# Patient Record
Sex: Female | Born: 1970 | Race: White | Hispanic: No | Smoking: Current some day smoker
Health system: Southern US, Community
[De-identification: ages and names within clinical notes are randomized; demographics above are authoritative.]

## PROBLEM LIST (undated history)

## (undated) DIAGNOSIS — N8110 Cystocele, unspecified: Secondary | ICD-10-CM

## (undated) DIAGNOSIS — G932 Benign intracranial hypertension: Secondary | ICD-10-CM

## (undated) DIAGNOSIS — N719 Inflammatory disease of uterus, unspecified: Secondary | ICD-10-CM

## (undated) DIAGNOSIS — E282 Polycystic ovarian syndrome: Secondary | ICD-10-CM

## (undated) DIAGNOSIS — N809 Endometriosis, unspecified: Secondary | ICD-10-CM

## (undated) HISTORY — PX: HIP ARTHROPLASTY: SHX981

## (undated) HISTORY — PX: OTHER SURGICAL HISTORY: SHX169

## (undated) HISTORY — PX: RIGHT OOPHORECTOMY: SHX2359

## (undated) HISTORY — PX: WRIST SURGERY: SHX841

## (undated) HISTORY — PX: ABDOMINAL HYSTERECTOMY: SHX81

## (undated) HISTORY — PX: LAPAROTOMY: SHX154

## (undated) HISTORY — PX: TONSILLECTOMY: SUR1361

---
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
## 1991-05-10 DIAGNOSIS — E282 Polycystic ovarian syndrome: Secondary | ICD-10-CM

## 1991-05-10 HISTORY — DX: Polycystic ovarian syndrome: E28.2

## 1994-05-09 DIAGNOSIS — N809 Endometriosis, unspecified: Secondary | ICD-10-CM

## 1994-05-09 HISTORY — DX: Endometriosis, unspecified: N80.9

## 1994-05-09 HISTORY — PX: OTHER SURGICAL HISTORY: SHX169

## 2004-03-29 DIAGNOSIS — F4310 Post-traumatic stress disorder, unspecified: Secondary | ICD-10-CM | POA: Insufficient documentation

## 2005-05-09 HISTORY — PX: LEFT OOPHORECTOMY: SHX1961

## 2005-11-24 ENCOUNTER — Emergency Department: Payer: Self-pay | Admitting: Emergency Medicine

## 2006-06-03 ENCOUNTER — Emergency Department: Payer: Self-pay | Admitting: Emergency Medicine

## 2006-09-12 ENCOUNTER — Emergency Department: Payer: Self-pay | Admitting: Emergency Medicine

## 2007-01-04 ENCOUNTER — Emergency Department: Payer: Self-pay | Admitting: Emergency Medicine

## 2007-01-10 ENCOUNTER — Emergency Department: Payer: Self-pay | Admitting: Internal Medicine

## 2007-06-03 ENCOUNTER — Emergency Department: Payer: Self-pay | Admitting: Emergency Medicine

## 2007-06-15 ENCOUNTER — Emergency Department: Payer: Self-pay | Admitting: Emergency Medicine

## 2008-02-12 ENCOUNTER — Emergency Department (HOSPITAL_COMMUNITY): Admission: EM | Admit: 2008-02-12 | Discharge: 2008-02-12 | Payer: Self-pay | Admitting: Emergency Medicine

## 2008-03-17 ENCOUNTER — Emergency Department: Payer: Self-pay | Admitting: Emergency Medicine

## 2008-03-25 ENCOUNTER — Emergency Department: Payer: Self-pay | Admitting: Emergency Medicine

## 2008-03-26 ENCOUNTER — Ambulatory Visit: Payer: Self-pay | Admitting: Emergency Medicine

## 2008-06-28 ENCOUNTER — Emergency Department: Payer: Self-pay | Admitting: Unknown Physician Specialty

## 2008-08-30 ENCOUNTER — Emergency Department: Payer: Self-pay | Admitting: Emergency Medicine

## 2009-06-26 ENCOUNTER — Emergency Department: Payer: Self-pay | Admitting: Emergency Medicine

## 2009-08-03 ENCOUNTER — Emergency Department: Payer: Self-pay | Admitting: Emergency Medicine

## 2009-08-23 ENCOUNTER — Emergency Department: Payer: Self-pay | Admitting: Emergency Medicine

## 2009-11-15 ENCOUNTER — Emergency Department (HOSPITAL_COMMUNITY): Admission: EM | Admit: 2009-11-15 | Discharge: 2009-11-15 | Payer: Self-pay | Admitting: Emergency Medicine

## 2009-11-17 ENCOUNTER — Emergency Department (HOSPITAL_COMMUNITY): Admission: EM | Admit: 2009-11-17 | Discharge: 2009-11-17 | Payer: Self-pay | Admitting: Emergency Medicine

## 2010-01-10 ENCOUNTER — Emergency Department (HOSPITAL_COMMUNITY): Admission: EM | Admit: 2010-01-10 | Discharge: 2010-01-10 | Payer: Self-pay | Admitting: Emergency Medicine

## 2010-02-06 ENCOUNTER — Emergency Department (HOSPITAL_COMMUNITY): Admission: EM | Admit: 2010-02-06 | Discharge: 2010-02-07 | Payer: Self-pay | Admitting: Emergency Medicine

## 2010-02-09 ENCOUNTER — Emergency Department: Payer: Self-pay | Admitting: Emergency Medicine

## 2010-06-02 ENCOUNTER — Emergency Department: Payer: Self-pay | Admitting: Emergency Medicine

## 2010-07-25 LAB — URINALYSIS, ROUTINE W REFLEX MICROSCOPIC
Glucose, UA: NEGATIVE mg/dL
Hgb urine dipstick: NEGATIVE
Nitrite: NEGATIVE
Specific Gravity, Urine: 1.011 (ref 1.005–1.030)

## 2010-07-25 LAB — URINE CULTURE

## 2010-11-11 ENCOUNTER — Emergency Department: Payer: Self-pay | Admitting: Emergency Medicine

## 2010-11-17 ENCOUNTER — Emergency Department (HOSPITAL_COMMUNITY)
Admission: EM | Admit: 2010-11-17 | Discharge: 2010-11-18 | Disposition: A | Discharge status: Home / Self Care | Payer: Self-pay | Attending: Emergency Medicine | Admitting: Emergency Medicine

## 2010-11-17 DIAGNOSIS — R110 Nausea: Secondary | ICD-10-CM | POA: Insufficient documentation

## 2010-11-17 DIAGNOSIS — N390 Urinary tract infection, site not specified: Secondary | ICD-10-CM | POA: Insufficient documentation

## 2010-11-17 DIAGNOSIS — M542 Cervicalgia: Secondary | ICD-10-CM | POA: Insufficient documentation

## 2010-11-17 DIAGNOSIS — M62838 Other muscle spasm: Secondary | ICD-10-CM | POA: Insufficient documentation

## 2010-11-17 DIAGNOSIS — R509 Fever, unspecified: Secondary | ICD-10-CM | POA: Insufficient documentation

## 2010-11-17 DIAGNOSIS — M479 Spondylosis, unspecified: Secondary | ICD-10-CM | POA: Insufficient documentation

## 2010-11-17 DIAGNOSIS — G932 Benign intracranial hypertension: Secondary | ICD-10-CM | POA: Insufficient documentation

## 2010-11-18 ENCOUNTER — Emergency Department (HOSPITAL_COMMUNITY): Payer: Self-pay

## 2010-11-18 LAB — DIFFERENTIAL
Basophils Relative: 1 % (ref 0–1)
Eosinophils Absolute: 0 10*3/uL (ref 0.0–0.7)
Eosinophils Relative: 1 % (ref 0–5)
Lymphocytes Relative: 40 % (ref 12–46)
Lymphs Abs: 0.9 10*3/uL (ref 0.7–4.0)
Monocytes Absolute: 0.2 10*3/uL (ref 0.1–1.0)
Neutro Abs: 1.1 10*3/uL — ABNORMAL LOW (ref 1.7–7.7)
Neutrophils Relative %: 51 % (ref 43–77)

## 2010-11-18 LAB — URINALYSIS, ROUTINE W REFLEX MICROSCOPIC: Urobilinogen, UA: 1 mg/dL (ref 0.0–1.0)

## 2010-11-18 LAB — CBC
HCT: 37.8 % (ref 36.0–46.0)
Hemoglobin: 12.7 g/dL (ref 12.0–15.0)
MCHC: 33.6 g/dL (ref 30.0–36.0)
MCV: 85.9 fL (ref 78.0–100.0)
RDW: 12.8 % (ref 11.5–15.5)
WBC: 2.2 10*3/uL — ABNORMAL LOW (ref 4.0–10.5)

## 2010-11-18 LAB — COMPREHENSIVE METABOLIC PANEL
ALT: 23 U/L (ref 0–35)
Albumin: 3.9 g/dL (ref 3.5–5.2)
BUN: 6 mg/dL (ref 6–23)
CO2: 28 mEq/L (ref 19–32)
Calcium: 9.4 mg/dL (ref 8.4–10.5)
Chloride: 99 mEq/L (ref 96–112)
Creatinine, Ser: 0.72 mg/dL (ref 0.50–1.10)
Glucose, Bld: 91 mg/dL (ref 70–99)
Sodium: 138 mEq/L (ref 135–145)
Total Protein: 7.4 g/dL (ref 6.0–8.3)

## 2010-11-18 LAB — URINE MICROSCOPIC-ADD ON

## 2010-11-19 LAB — URINE CULTURE: Culture  Setup Time: 201207120840

## 2010-11-21 LAB — CULTURE, BLOOD (ROUTINE X 2): Culture  Setup Time: 201207120514

## 2010-11-24 LAB — CULTURE, BLOOD (ROUTINE X 2)
Culture  Setup Time: 201207120514
Culture: NO GROWTH

## 2012-03-02 ENCOUNTER — Emergency Department (HOSPITAL_COMMUNITY): Payer: Self-pay

## 2012-03-02 ENCOUNTER — Emergency Department (HOSPITAL_COMMUNITY)
Admission: EM | Admit: 2012-03-02 | Discharge: 2012-03-02 | Discharge status: Against Medical Advice | Payer: Self-pay | Attending: Emergency Medicine | Admitting: Emergency Medicine

## 2012-03-02 ENCOUNTER — Encounter (HOSPITAL_COMMUNITY): Payer: Self-pay | Admitting: *Deleted

## 2012-03-02 DIAGNOSIS — R110 Nausea: Secondary | ICD-10-CM | POA: Insufficient documentation

## 2012-03-02 DIAGNOSIS — R0602 Shortness of breath: Secondary | ICD-10-CM | POA: Insufficient documentation

## 2012-03-02 DIAGNOSIS — R079 Chest pain, unspecified: Secondary | ICD-10-CM | POA: Insufficient documentation

## 2012-03-02 MED ORDER — SODIUM CHLORIDE 0.9 % IV BOLUS (SEPSIS): 1000.0000 mL | Freq: Once | INTRAVENOUS | Status: DC

## 2012-03-02 MED ORDER — NITROGLYCERIN 0.4 MG SL SUBL
SUBLINGUAL_TABLET | SUBLINGUAL | Status: AC
  Administered 2012-03-02: 15:00:00
  Filled 2012-03-02: qty 25

## 2012-03-02 MED ORDER — NITROGLYCERIN 0.4 MG SL SUBL: 0.4000 mg | SUBLINGUAL_TABLET | SUBLINGUAL | Status: DC | PRN

## 2012-03-02 MED ORDER — ASPIRIN 81 MG PO CHEW
CHEWABLE_TABLET | ORAL | Status: AC
  Administered 2012-03-02: 243 mg
  Filled 2012-03-02: qty 3

## 2012-03-02 MED ORDER — ASPIRIN 81 MG PO CHEW: 162.0000 mg | CHEWABLE_TABLET | Freq: Once | ORAL | Status: DC

## 2012-03-02 MED ORDER — SODIUM CHLORIDE 0.9 % IV SOLN: Freq: Once | INTRAVENOUS | Status: DC

## 2012-03-02 MED ORDER — ONDANSETRON HCL 4 MG/2ML IJ SOLN: 4.0000 mg | Freq: Once | INTRAMUSCULAR | Status: DC

## 2012-03-02 NOTE — ED Provider Notes (Addendum)
History     CSN: [id number]  Arrival date & time 03/02/12  1435   First MD Initiated Contact with Patient 03/02/12 1510      Chief Complaint  Patient presents with  . Chest Pain    (Consider location/radiation/quality/duration/timing/severity/associated sxs/prior treatment) HPI Comments: Pt comes in with cc of chest pain. Pt has no hx of DM, HTN, HL, no hx of CAD, no family hx of premature CAD, and she is not a smoker. Pt reports that she started having chest pain y'day. It is a constant pain, with no specific aggravating, aggravating or relieving factors. The pain is not pleuritic, there is no new cough, no fevers, chills, and she has no risk factors for PE, DVT (wells score is 0). The pain is sharp, and is located on the left side. It is not worse with exertion, nor is it worse with palpation. No associated diaphoresis, + sob, nausea. Pt reports being in heavy stress lately, and losing her mother just 2 weeks ago.   Patient is a 41 y.o. female presenting with chest pain. The history is provided by the patient.  Chest Pain Primary symptoms include shortness of breath and nausea. Pertinent negatives for primary symptoms include no abdominal pain and no vomiting.     History reviewed. No pertinent past medical history.  Past Surgical History  Procedure Date  . Hip arthroplasty   . Abdominal hysterectomy   . Mass off appendix     No family history on file.  History  Substance Use Topics  . Smoking status: Never Smoker   . Smokeless tobacco: Never Used  . Alcohol Use: No    OB History    Grav Para Term Preterm Abortions TAB SAB Ect Mult Living                  Review of Systems  Constitutional: Negative for activity change.  HENT: Negative for neck pain.   Respiratory: Positive for shortness of breath.   Cardiovascular: Positive for chest pain.  Gastrointestinal: Positive for nausea. Negative for vomiting and abdominal pain.  Genitourinary: Negative for dysuria.    Neurological: Negative for headaches.    Allergies  Darvocet; Eggs or egg-derived products; and Gabapentin  Home Medications   Current Outpatient Rx  Name Route Sig Dispense Refill  . DIAZEPAM 5 MG PO TABS Oral Take 5 mg by mouth 2 (two) times daily as needed. Anxiety.    . FLUOXETINE HCL 20 MG PO TABS Oral Take 30 mg by mouth daily. Pt takes 1 and 1/2 tablet for a total dose of 30 mg    . QUETIAPINE FUMARATE 50 MG PO TABS Oral Take 50-150 mg by mouth at bedtime. Pt takes 50 mg daily with an additional 150 mg to relive migraine when needed    . TIZANIDINE HCL 4 MG PO CAPS Oral Take 4 mg by mouth 3 (three) times daily.      BP 118/69  Pulse 88  Temp 98.1 F (36.7 C) (Oral)  Resp 18  Ht 5\' 7"  (1.702 m)  Wt 250 lb (113.399 kg)  BMI 39.16 kg/m2  SpO2 97%  Physical Exam  Nursing note and vitals reviewed. Constitutional: She is oriented to person, place, and time. She appears well-developed.  HENT:  Head: Normocephalic and atraumatic.  Eyes: Conjunctivae normal and EOM are normal. Pupils are equal, round, and reactive to light.  Neck: Normal range of motion. Neck supple. No JVD present.  Cardiovascular: Normal rate, regular rhythm and normal  heart sounds.   Pulmonary/Chest: Effort normal and breath sounds normal. No respiratory distress.  Abdominal: Soft. Bowel sounds are normal. She exhibits no distension. There is no tenderness. There is no rebound and no guarding.  Neurological: She is alert and oriented to person, place, and time.  Skin: Skin is warm and dry.    ED Course  Procedures (including critical care time)   Labs Reviewed  BASIC METABOLIC PANEL  CBC WITH DIFFERENTIAL  TROPONIN I  URINALYSIS, ROUTINE W REFLEX MICROSCOPIC  MAGNESIUM  TROPONIN I   No results found.   No diagnosis found.    MDM  Differential diagnosis includes: ACS syndrome CHF exacerbation Valvular disorder Myocarditis Pericarditis Pericardial effusion Pneumonia Pleural  effusion Pulmonary edema PE Anemia Musculoskeletal pain  Pt comes in with cc of chest pain, left sided, radiating to the left shoulder with associated nausea and sob. The pain has both typical and atypical features, but she has 0 cardiac risk factors. She has a WELLS score of 0 and is PERC negative.  We will get troponin x 2, EKG, and basic labs and reassess. Might have to admit for full ACS workup if she doesn't have reliable followup.  4:24 PM Nitro - no relief. EKG shows LVH, t wave inversion/flattening in inferior leads. If pain persists, will have to admit, otherwise will [facility].    Date: 03/02/2012  Rate: 87  Rhythm: normal sinus rhythm  QRS Axis: normal  Intervals: normal  ST/T Wave abnormalities: nonspecific T wave changes  Conduction Disutrbances:none  Narrative Interpretation:   Old EKG Reviewed: none available LVH, no right sided strain    Derwood Kaplan, MD 03/02/12 1624  Derwood Kaplan, MD 03/02/12 1626

## 2012-03-02 NOTE — ED Notes (Signed)
Pt not in room.  Corky Crafts is on the bed as well as monitor leads.  Will look for pt in lobby and restrooms.

## 2012-03-02 NOTE — ED Notes (Signed)
Pt c/o "dull, knife" pain to LT side of chest since yesterday-off and on.  States her mother died 2 weeks ago and has been taking care of her for a year.  Also c/o nausea, h/a, Lt arm pain and dizziness.

## 2012-03-02 NOTE — ED Notes (Signed)
1st attempt to obtain labs, pt not in room

## 2012-03-02 NOTE — ED Notes (Signed)
No sign of pt in room still.  Will assume pt has left.  EDP aware.

## 2012-03-02 NOTE — ED Notes (Signed)
Pt sts she is going through stressful time right now. Mother recently passed away. Pt sts she began having chest pain yesterday with worsening sx today. Pt sts it is radiating to her left arm with numbness, no radiation to jaw or back. Pt also sts that she is having nausea as well. Started yesterday at approx. 4pm. Pt sts she was not doing anything strenuous at onset of chest pain.

## 2012-03-23 DIAGNOSIS — F419 Anxiety disorder, unspecified: Secondary | ICD-10-CM | POA: Insufficient documentation

## 2012-10-31 ENCOUNTER — Emergency Department (HOSPITAL_COMMUNITY): Payer: Self-pay

## 2012-10-31 ENCOUNTER — Emergency Department (HOSPITAL_COMMUNITY)
Admission: EM | Admit: 2012-10-31 | Discharge: 2012-11-01 | Disposition: A | Discharge status: Home / Self Care | Payer: Self-pay | Attending: Emergency Medicine | Admitting: Emergency Medicine

## 2012-10-31 ENCOUNTER — Other Ambulatory Visit: Payer: Self-pay

## 2012-10-31 ENCOUNTER — Encounter (HOSPITAL_COMMUNITY): Payer: Self-pay | Admitting: Emergency Medicine

## 2012-10-31 DIAGNOSIS — E669 Obesity, unspecified: Secondary | ICD-10-CM | POA: Insufficient documentation

## 2012-10-31 DIAGNOSIS — R0789 Other chest pain: Secondary | ICD-10-CM

## 2012-10-31 DIAGNOSIS — R05 Cough: Secondary | ICD-10-CM | POA: Insufficient documentation

## 2012-10-31 DIAGNOSIS — R071 Chest pain on breathing: Secondary | ICD-10-CM | POA: Insufficient documentation

## 2012-10-31 DIAGNOSIS — Z79899 Other long term (current) drug therapy: Secondary | ICD-10-CM | POA: Insufficient documentation

## 2012-10-31 DIAGNOSIS — R0602 Shortness of breath: Secondary | ICD-10-CM | POA: Insufficient documentation

## 2012-10-31 DIAGNOSIS — R059 Cough, unspecified: Secondary | ICD-10-CM | POA: Insufficient documentation

## 2012-10-31 DIAGNOSIS — I10 Essential (primary) hypertension: Secondary | ICD-10-CM | POA: Insufficient documentation

## 2012-10-31 LAB — BASIC METABOLIC PANEL
CO2: 26 mEq/L (ref 19–32)
Chloride: 101 mEq/L (ref 96–112)
Creatinine, Ser: 0.78 mg/dL (ref 0.50–1.10)
GFR calc Af Amer: >90 mL/min (ref >90)
Potassium: 4.1 mEq/L (ref 3.5–5.1)
Sodium: 137 mEq/L (ref 135–145)

## 2012-10-31 LAB — PRO B NATRIURETIC PEPTIDE: Pro B Natriuretic peptide (BNP): 66.9 pg/mL (ref 0–125)

## 2012-10-31 LAB — CBC
MCV: 86.6 fL (ref 78.0–100.0)
Platelets: 302 10*3/uL (ref 150–400)
RBC: 4.54 MIL/uL (ref 3.87–5.11)
WBC: 7 10*3/uL (ref 4.0–10.5)

## 2012-10-31 LAB — POCT I-STAT TROPONIN I: Troponin i, poc: 0.01 ng/mL (ref 0.00–0.08)

## 2012-10-31 MED ORDER — ONDANSETRON HCL 4 MG/2ML IJ SOLN
4.0000 mg | Freq: Once | INTRAMUSCULAR | Status: AC
  Administered 2012-10-31: 4 mg via INTRAVENOUS
  Filled 2012-10-31: qty 2

## 2012-10-31 MED ORDER — MORPHINE SULFATE 4 MG/ML IJ SOLN
4.0000 mg | Freq: Once | INTRAMUSCULAR | Status: AC
  Administered 2012-10-31: 4 mg via INTRAVENOUS
  Filled 2012-10-31: qty 1

## 2012-10-31 MED ORDER — ASPIRIN 325 MG PO TABS
325.0000 mg | ORAL_TABLET | ORAL | Status: AC
  Administered 2012-10-31: 325 mg via ORAL
  Filled 2012-10-31: qty 1

## 2012-10-31 MED ORDER — NITROGLYCERIN 0.4 MG SL SUBL
0.4000 mg | SUBLINGUAL_TABLET | SUBLINGUAL | Status: DC | PRN
  Filled 2012-10-31: qty 25

## 2012-10-31 NOTE — ED Notes (Signed)
PT. REPORTS PAIN UNDER LEFT BREAST RADIATING TO LEFT LATERAL CHEST WITH TINGLING AT BOTH ARMS FOR SEVERAL DAYS WORSE TODAY ,  SLIGHT SOB WITH DRY COUGH AND NAUSEA.

## 2012-10-31 NOTE — ED Notes (Signed)
Dr Manly at bedside 

## 2012-10-31 NOTE — ED Notes (Signed)
Pt c/o pain to left breast that is intermittent and pain to lateral chest that is constant and stabbing. Pt states pain also goes to her neck and starts as numbness and tingling down left arm. Pt states she has been feeling run down recently, coughing, decreased appetite, and nausea.

## 2012-10-31 NOTE — ED Provider Notes (Signed)
History    CSN: [id number] Arrival date & time 10/31/12  2028  First MD Initiated Contact with Patient 10/31/12 2301     Chief Complaint  Patient presents with  . Chest Pain   (Consider location/radiation/quality/duration/timing/severity/associated sxs/prior Treatment) HPI  Patient is a 42 yo obese woman with HTN who smokes 1/2 ppd. She  presents with 2d of left sided chest pain which radiates to the from under the left breast to the left lateral chest wall. Pain is constant, aching, worsening and 8/10 in severity. Pt notes a mild non productive cough. She endorses SOB with exertion. Denies fever. Says, "I feel like something is sitting on my chest right now.    Past Medical History  Diagnosis Date  . Hypertension    Past Surgical History  Procedure Laterality Date  . Hip arthroplasty    . Abdominal hysterectomy    . Mass off appendix     No family history on file. History  Substance Use Topics  . Smoking status: Never Smoker   . Smokeless tobacco: Never Used  . Alcohol Use: No   OB History   Grav Para Term Preterm Abortions TAB SAB Ect Mult Living                 Review of Systems Gen: no weight loss, fevers, chills, night sweats Eyes: no discharge or drainage, no occular pain or visual changes Nose: no epistaxis or rhinorrhea Mouth: no dental pain, no sore throat Neck: no neck pain Lungs: no SOB, cough, wheezing CV: As per history of present illness, otherwise negative Abd: no abdominal pain, nausea, vomiting GU: no dysuria or gross hematuria MSK: As per history of present illness, otherwise negative Neuro: no headache, no focal neurologic deficits Skin: no rash Psyche: negative.  Allergies  Darvocet; Eggs or egg-derived products; and Gabapentin  Home Medications   Current Outpatient Rx  Name  Route  Sig  Dispense  Refill  . acetaminophen (TYLENOL) 500 MG tablet   Oral   Take 500 mg by mouth every 6 (six) hours as needed for pain.         Marland Kitchen  acetaZOLAMIDE (DIAMOX) 500 MG capsule   Oral   Take 1,000 mg by mouth 2 (two) times daily.         . QUEtiapine (SEROQUEL) 50 MG tablet   Oral   Take 50-150 mg by mouth at bedtime. Pt takes 50 mg daily with an additional 150 mg to relive migraine when needed         . tiZANidine (ZANAFLEX) 4 MG tablet   Oral   Take 4 mg by mouth every 8 (eight) hours as needed (for muscle relaxer).          BP 132/107  Pulse 97  Temp(Src) 98 F (36.7 C) (Oral)  Resp 16  SpO2 94% Physical Exam Gen: well developed and well nourished appearing Head: NCAT Eyes: PERL, EOMI Nose: no epistaixis or rhinorrhea Mouth/throat: mucosa is moist and pink Neck: supple, no stridor Lungs: CTA B, no wheezing, rhonchi or rales CV: chest wall tenderness with reproducible pain, RRR, no murmur, extremities well perfused Abd: soft, obese, notender, nondistended Back: no ttp, no cva ttp Skin: no rashese, wnl Neuro: CN ii-xii grossly intact, no focal deficits Psyche; normal affect,  calm and cooperative.   ED Course  Procedures (including critical care time)  Results for orders placed during the hospital encounter of 10/31/12 (from the past 48 hour(s))  CBC  Status: None   Collection Time    10/31/12  8:45 PM      Result Value Range   WBC 7.0  4.0 - 10.5 K/uL   RBC 4.54  3.87 - 5.11 MIL/uL   Hemoglobin 13.2  12.0 - 15.0 g/dL   HCT 81.1  91.4 - 78.2 %   MCV 86.6  78.0 - 100.0 fL   MCH 29.1  26.0 - 34.0 pg   MCHC 33.6  30.0 - 36.0 g/dL   RDW 95.6  21.3 - 08.6 %   Platelets 302  150 - 400 K/uL  BASIC METABOLIC PANEL     Status: Abnormal   Collection Time    10/31/12  8:45 PM      Result Value Range   Sodium 137  135 - 145 mEq/L   Potassium 4.1  3.5 - 5.1 mEq/L   Chloride 101  96 - 112 mEq/L   CO2 26  19 - 32 mEq/L   Glucose, Bld 106 (*) 70 - 99 mg/dL   BUN 14  6 - 23 mg/dL   Creatinine, Ser 5.78  0.50 - 1.10 mg/dL   Calcium 9.4  8.4 - 46.9 mg/dL   GFR calc non Af Amer >90  >90 mL/min   GFR  calc Af Amer >90  >90 mL/min   Comment:            The eGFR has been calculated     using the CKD EPI equation.     This calculation has not been     validated in all clinical     situations.     eGFR's persistently     <90 mL/min signify     possible Chronic Kidney Disease.  PRO B NATRIURETIC PEPTIDE     Status: None   Collection Time    10/31/12  8:45 PM      Result Value Range   Pro B Natriuretic peptide (BNP) 66.9  0 - 125 pg/mL  POCT I-STAT TROPONIN I     Status: None   Collection Time    10/31/12  8:49 PM      Result Value Range   Troponin i, poc 0.01  0.00 - 0.08 ng/mL   Comment 3            Comment: Due to the release kinetics of cTnI,     a negative result within the first hours     of the onset of symptoms does not rule out     myocardial infarction with certainty.     If myocardial infarction is still suspected,     repeat the test at appropriate intervals.  D-DIMER, QUANTITATIVE     Status: None   Collection Time    10/31/12 11:46 PM      Result Value Range   D-Dimer, Quant 0.27  0.00 - 0.48 ug/mL-FEU   Comment:            AT THE INHOUSE ESTABLISHED CUTOFF     VALUE OF 0.48 ug/mL FEU,     THIS ASSAY HAS BEEN DOCUMENTED     IN THE LITERATURE TO HAVE     A SENSITIVITY AND NEGATIVE     PREDICTIVE VALUE OF AT LEAST     98 TO 99%.  THE TEST RESULT     SHOULD BE CORRELATED WITH     AN ASSESSMENT OF THE CLINICAL     PROBABILITY OF DVT / VTE.   EKG: nsr, no acute  ischemic changes, normal intervals, normal axis, normal qrs complex CXR: normal cardiac silloute, normal appearing mediastinum, no infiltrates, no acute process identified.   MDM  ED work up nondiagnostic. Patient is pain free and stable for discharge.   Brandt Loosen, MD 11/01/12 [phone]

## 2012-11-01 LAB — D-DIMER, QUANTITATIVE: D-Dimer, Quant: 0.27 ug/mL-FEU (ref 0.00–0.48)

## 2012-11-01 MED ORDER — OXYCODONE-ACETAMINOPHEN 5-325 MG PO TABS: 1.0000 | ORAL_TABLET | Freq: Four times a day (QID) | ORAL | Status: DC | PRN

## 2012-11-01 MED ORDER — HYDROMORPHONE HCL PF 1 MG/ML IJ SOLN
1.0000 mg | Freq: Once | INTRAMUSCULAR | Status: AC
  Administered 2012-11-01: 1 mg via INTRAVENOUS
  Filled 2012-11-01: qty 1

## 2012-11-01 NOTE — ED Notes (Signed)
Pt comfortable with d/c and f/u instructions. Prescriptions x1 

## 2012-11-08 ENCOUNTER — Encounter (HOSPITAL_COMMUNITY): Payer: Self-pay | Admitting: Emergency Medicine

## 2012-11-08 ENCOUNTER — Emergency Department (HOSPITAL_COMMUNITY): Payer: Self-pay

## 2012-11-08 ENCOUNTER — Emergency Department (HOSPITAL_COMMUNITY)
Admission: EM | Admit: 2012-11-08 | Discharge: 2012-11-08 | Disposition: A | Discharge status: Home / Self Care | Payer: Self-pay | Attending: Emergency Medicine | Admitting: Emergency Medicine

## 2012-11-08 DIAGNOSIS — R0789 Other chest pain: Secondary | ICD-10-CM

## 2012-11-08 DIAGNOSIS — Z79899 Other long term (current) drug therapy: Secondary | ICD-10-CM | POA: Insufficient documentation

## 2012-11-08 DIAGNOSIS — I10 Essential (primary) hypertension: Secondary | ICD-10-CM | POA: Insufficient documentation

## 2012-11-08 DIAGNOSIS — R071 Chest pain on breathing: Secondary | ICD-10-CM | POA: Insufficient documentation

## 2012-11-08 LAB — BASIC METABOLIC PANEL
Calcium: 9.1 mg/dL (ref 8.4–10.5)
GFR calc Af Amer: >90 mL/min (ref >90)
GFR calc non Af Amer: >90 mL/min (ref >90)
Potassium: 3.8 mEq/L (ref 3.5–5.1)
Sodium: 139 mEq/L (ref 135–145)

## 2012-11-08 LAB — CBC
MCHC: 33.8 g/dL (ref 30.0–36.0)
RDW: 13 % (ref 11.5–15.5)

## 2012-11-08 LAB — POCT I-STAT TROPONIN I: Troponin i, poc: 0 ng/mL (ref 0.00–0.08)

## 2012-11-08 MED ORDER — BENZONATATE 200 MG PO CAPS: 200.0000 mg | ORAL_CAPSULE | Freq: Three times a day (TID) | ORAL | Status: DC | PRN

## 2012-11-08 MED ORDER — GI COCKTAIL ~~LOC~~
30.0000 mL | Freq: Once | ORAL | Status: AC
  Administered 2012-11-08: 30 mL via ORAL
  Filled 2012-11-08: qty 30

## 2012-11-08 MED ORDER — DIAZEPAM 5 MG PO TABS: 5.0000 mg | ORAL_TABLET | Freq: Three times a day (TID) | ORAL | Status: DC | PRN

## 2012-11-08 MED ORDER — BENZONATATE 100 MG PO CAPS
200.0000 mg | ORAL_CAPSULE | Freq: Three times a day (TID) | ORAL | Status: DC | PRN
  Administered 2012-11-08: 200 mg via ORAL
  Filled 2012-11-08: qty 2

## 2012-11-08 MED ORDER — KETOROLAC TROMETHAMINE 30 MG/ML IJ SOLN
30.0000 mg | Freq: Once | INTRAMUSCULAR | Status: AC
  Administered 2012-11-08: 30 mg via INTRAVENOUS
  Filled 2012-11-08: qty 1

## 2012-11-08 MED ORDER — DIAZEPAM 5 MG/ML IJ SOLN
2.5000 mg | Freq: Once | INTRAMUSCULAR | Status: AC
  Administered 2012-11-08: 2.5 mg via INTRAVENOUS
  Filled 2012-11-08: qty 2

## 2012-11-08 MED ORDER — HYDROMORPHONE HCL PF 1 MG/ML IJ SOLN
1.0000 mg | Freq: Once | INTRAMUSCULAR | Status: AC
  Administered 2012-11-08: 1 mg via INTRAVENOUS
  Filled 2012-11-08: qty 1

## 2012-11-08 NOTE — ED Notes (Signed)
PT. REPORTS PERSISTENT LEFT CHEST PAIN UNDER LEFT BREAST RADIATING TO LEFT ARM AND NECK WITH SLIGHT SOB , DENIES COUGH . NO NAUSEA OR DIAPHORESIS.

## 2012-11-08 NOTE — ED Provider Notes (Signed)
History    CSN: [id number] Arrival date & time 11/08/12  0018  First MD Initiated Contact with Patient 11/08/12 0103     Chief Complaint  Patient presents with  . Chest Pain   (Consider location/radiation/quality/duration/timing/severity/associated sxs/prior Treatment) HPI 42 yo female presents to the ER with complaint of 2 weeks of persistent left chest pain under left breast, pain to left neck and down left arm.  Pt seen in ED last week for same, had negative w/u.  Treated with dilaudid IV, given rx for percocet.  Pt reports she has been using percocet without improvement.  She is unable to take nsaids due to ulcer in "upper duodenum of stomach".  She has been using ice packs with some minor relief.  Pain is sharp like a knife.  She denies any trauma, no new activities.  Pt has developed cough in the last 2 days which has worsened pain.  Past Medical History  Diagnosis Date  . Hypertension    Past Surgical History  Procedure Laterality Date  . Hip arthroplasty    . Abdominal hysterectomy    . Mass off appendix     No family history on file. History  Substance Use Topics  . Smoking status: Never Smoker   . Smokeless tobacco: Never Used  . Alcohol Use: No   OB History   Grav Para Term Preterm Abortions TAB SAB Ect Mult Living                 Review of Systems  All other systems reviewed and are negative.    Allergies  Darvocet; Eggs or egg-derived products; and Gabapentin  Home Medications   Current Outpatient Rx  Name  Route  Sig  Dispense  Refill  . acetaminophen (TYLENOL) 500 MG tablet   Oral   Take 500 mg by mouth every 6 (six) hours as needed for pain.         Marland Kitchen acetaZOLAMIDE (DIAMOX) 500 MG capsule   Oral   Take 500 mg by mouth 4 (four) times daily.          Marland Kitchen oxyCODONE-acetaminophen (PERCOCET/ROXICET) 5-325 MG per tablet   Oral   Take 1-2 tablets by mouth every 6 (six) hours as needed for pain.   20 tablet   0   . PRESCRIPTION MEDICATION    Both Eyes   Place into both eyes daily. RX Drop from [facility],         . QUEtiapine (SEROQUEL) 50 MG tablet   Oral   Take 50-150 mg by mouth at bedtime. Pt takes 50 mg daily with an additional 150 mg to relive migraine when needed         . tiZANidine (ZANAFLEX) 4 MG tablet   Oral   Take 4 mg by mouth every 8 (eight) hours as needed (for muscle relaxer).          BP 100/62  Pulse 76  Temp(Src) 97.8 F (36.6 C) (Oral)  Resp 20  SpO2 95% Physical Exam  Nursing note and vitals reviewed. Constitutional: She is oriented to person, place, and time. She appears well-developed and well-nourished. She appears distressed.  HENT:  Head: Normocephalic and atraumatic.  Right Ear: External ear normal.  Left Ear: External ear normal.  Nose: Nose normal.  Mouth/Throat: Oropharynx is clear and moist.  Eyes: Conjunctivae and EOM are normal. Pupils are equal, round, and reactive to light.  Neck: Normal range of motion. Neck supple. No JVD present. No tracheal  deviation present. No thyromegaly present.  Cardiovascular: Normal rate, regular rhythm, normal heart sounds and intact distal pulses.  Exam reveals no gallop and no friction rub.   No murmur heard. Pulmonary/Chest: Effort normal and breath sounds normal. No stridor. No respiratory distress. She has no wheezes. She has no rales. She exhibits tenderness (left chest wall tenderness in undercrease of the left breast.  No masses, no overlying skin changes,  no induration).  Abdominal: Soft. Bowel sounds are normal. She exhibits no distension and no mass. There is no tenderness. There is no rebound and no guarding.  Musculoskeletal: Normal range of motion. She exhibits no edema and no tenderness.  Lymphadenopathy:    She has no cervical adenopathy.  Neurological: She is alert and oriented to person, place, and time. She exhibits normal muscle tone. Coordination normal.  Skin: Skin is warm and dry. No rash noted. No erythema. No pallor.   Psychiatric: She has a normal mood and affect. Her behavior is normal. Judgment and thought content normal.    ED Course  Procedures (including critical care time) Labs Reviewed  CBC  BASIC METABOLIC PANEL  PRO B NATRIURETIC PEPTIDE  POCT I-STAT TROPONIN I   Dg Chest 2 View  11/08/2012   *RADIOLOGY REPORT*  Clinical Data: Chest pain radiating into the left arm.  CHEST - 2 VIEW  Comparison: CT chest 10/29/2012.  Plain films of the chest 10/31/2012.  Findings: Lungs are clear.  Heart size is normal.  No pneumothorax or pleural fluid.  IMPRESSION: Negative chest.   Original Report Authenticated By: Holley Dexter, M.D.    Date: 11/08/2012  Rate: 84  Rhythm: normal sinus rhythm  QRS Axis: normal  Intervals: normal  ST/T Wave abnormalities: normal  Conduction Disutrbances:none  Narrative Interpretation:   Old EKG Reviewed: unchanged    1. Chest wall pain     MDM  42 yo female with persistent chest pain x 2 weeks.  Noted that chest xray was compared to chest CT scan on 6/23.  Pt was initially adamant that she had not had a chest CT or been seen at any outside hospital prior to her visit on 6/25 at [facility].  D/w radiologist who reports pt had chest xray and chest CT scan, PE protocol, done at [facility] on 6/23.  [place] Controlled substance database shows percocet prescription #25 on that date.  Workup here again unremarkable.  Pain reproducible with palpation.  Possible chest wall strain.  Plan was to tx with valium, toradol as patient reports no improvement with percocet.  Concern for malingering/drug seeking behavior given mult ed visits.  On re-eval pt reports no improvement.  She now remembers going to [place] when I confronted her again about her workup.  No mention of this visit on Dr Boykin Reaper note.  As patient has received 2 percocet prescriptions in the past week, will not give further.  Will give short course of valium.  Pt encouraged to f/u with pcm with possible PT  referral.     Olivia Mackie, MD 11/08/12 [phone]

## 2012-11-08 NOTE — ED Notes (Signed)
Pt dc to home.  Pt sts understanding to dc instructions. Pt ambulatory to exit without difficulty. Denies need for w/c. 

## 2012-11-14 ENCOUNTER — Emergency Department (HOSPITAL_COMMUNITY)
Admission: EM | Admit: 2012-11-14 | Discharge: 2012-11-14 | Disposition: A | Discharge status: Home / Self Care | Payer: Self-pay | Attending: Emergency Medicine | Admitting: Emergency Medicine

## 2012-11-14 ENCOUNTER — Encounter (HOSPITAL_COMMUNITY): Payer: Self-pay | Admitting: *Deleted

## 2012-11-14 ENCOUNTER — Emergency Department (HOSPITAL_COMMUNITY): Payer: Self-pay

## 2012-11-14 DIAGNOSIS — R42 Dizziness and giddiness: Secondary | ICD-10-CM | POA: Insufficient documentation

## 2012-11-14 DIAGNOSIS — R51 Headache: Secondary | ICD-10-CM | POA: Insufficient documentation

## 2012-11-14 DIAGNOSIS — Z8669 Personal history of other diseases of the nervous system and sense organs: Secondary | ICD-10-CM | POA: Insufficient documentation

## 2012-11-14 DIAGNOSIS — R6889 Other general symptoms and signs: Secondary | ICD-10-CM | POA: Insufficient documentation

## 2012-11-14 DIAGNOSIS — Z79899 Other long term (current) drug therapy: Secondary | ICD-10-CM | POA: Insufficient documentation

## 2012-11-14 DIAGNOSIS — M542 Cervicalgia: Secondary | ICD-10-CM | POA: Insufficient documentation

## 2012-11-14 DIAGNOSIS — R0789 Other chest pain: Secondary | ICD-10-CM | POA: Insufficient documentation

## 2012-11-14 DIAGNOSIS — R05 Cough: Secondary | ICD-10-CM | POA: Insufficient documentation

## 2012-11-14 DIAGNOSIS — G932 Benign intracranial hypertension: Secondary | ICD-10-CM | POA: Insufficient documentation

## 2012-11-14 DIAGNOSIS — R110 Nausea: Secondary | ICD-10-CM | POA: Insufficient documentation

## 2012-11-14 DIAGNOSIS — R059 Cough, unspecified: Secondary | ICD-10-CM | POA: Insufficient documentation

## 2012-11-14 LAB — RAPID URINE DRUG SCREEN, HOSP PERFORMED
Amphetamines: NOT DETECTED
Benzodiazepines: NOT DETECTED
Cocaine: NOT DETECTED
Opiates: NOT DETECTED
Tetrahydrocannabinol: NOT DETECTED

## 2012-11-14 LAB — COMPREHENSIVE METABOLIC PANEL
AST: 37 U/L (ref 0–37)
Albumin: 3.5 g/dL (ref 3.5–5.2)
BUN: 12 mg/dL (ref 6–23)
Chloride: 100 mEq/L (ref 96–112)
Creatinine, Ser: 0.72 mg/dL (ref 0.50–1.10)
Total Bilirubin: 0.1 mg/dL — ABNORMAL LOW (ref 0.3–1.2)
Total Protein: 7.2 g/dL (ref 6.0–8.3)

## 2012-11-14 LAB — CBC WITH DIFFERENTIAL/PLATELET
Basophils Absolute: 0 10*3/uL (ref 0.0–0.1)
Basophils Relative: 1 % (ref 0–1)
Eosinophils Absolute: 0.2 10*3/uL (ref 0.0–0.7)
HCT: 35.6 % — ABNORMAL LOW (ref 36.0–46.0)
MCH: 28.8 pg (ref 26.0–34.0)
MCHC: 33.4 g/dL (ref 30.0–36.0)
Monocytes Absolute: 0.4 10*3/uL (ref 0.1–1.0)
Neutro Abs: 3.9 10*3/uL (ref 1.7–7.7)
Neutrophils Relative %: 50 % (ref 43–77)
RDW: 12.8 % (ref 11.5–15.5)

## 2012-11-14 LAB — TROPONIN I: Troponin I: <0.3 ng/mL (ref <0.30)

## 2012-11-14 MED ORDER — ONDANSETRON HCL 4 MG PO TABS: 4.0000 mg | ORAL_TABLET | Freq: Three times a day (TID) | ORAL | Status: DC | PRN

## 2012-11-14 MED ORDER — TIZANIDINE HCL 2 MG PO CAPS: 2.0000 mg | ORAL_CAPSULE | Freq: Three times a day (TID) | ORAL | Status: DC | PRN

## 2012-11-14 MED ORDER — MORPHINE SULFATE 4 MG/ML IJ SOLN: 4.0000 mg | Freq: Once | INTRAMUSCULAR | Status: DC

## 2012-11-14 MED ORDER — TRAMADOL HCL 50 MG PO TABS: 50.0000 mg | ORAL_TABLET | Freq: Four times a day (QID) | ORAL | Status: DC | PRN

## 2012-11-14 MED ORDER — KETOROLAC TROMETHAMINE 30 MG/ML IJ SOLN
30.0000 mg | Freq: Once | INTRAMUSCULAR | Status: AC
  Administered 2012-11-14: 30 mg via INTRAVENOUS
  Filled 2012-11-14: qty 1

## 2012-11-14 MED ORDER — FENTANYL CITRATE 0.05 MG/ML IJ SOLN
100.0000 ug | Freq: Once | INTRAMUSCULAR | Status: AC
  Administered 2012-11-14: 100 ug via INTRAVENOUS
  Filled 2012-11-14: qty 2

## 2012-11-14 MED ORDER — ONDANSETRON HCL 4 MG/2ML IJ SOLN
4.0000 mg | Freq: Once | INTRAMUSCULAR | Status: AC
  Administered 2012-11-14: 4 mg via INTRAVENOUS
  Filled 2012-11-14: qty 2

## 2012-11-14 NOTE — ED Notes (Signed)
Pt reports she has 2 fractured ribs, dizziness, nausea and pain since being seen at [facility]. States "something is not right and I just don't feel well."

## 2012-11-14 NOTE — ED Provider Notes (Signed)
History    CSN: [id number] Arrival date & time 11/14/12  1538  First MD Initiated Contact with Patient 11/14/12 1728     Chief Complaint  Patient presents with  . Dizziness  . Chest Pain   (Consider location/radiation/quality/duration/timing/severity/associated sxs/prior Treatment) HPI Comments: Pt is a 42yo female who presents with left-sided chest pain. Pain has been present for several weeks, sharp in nature, radiating under her left breast to her left side and up into her neck, which feels like "being stabbed with a hot poker." Pt has taken ibuprofen and Tylenol as well as Percocet at home without relief. Pt states she had a chest xray last week at [facility] and was told it was normal, but then was called the next day "by the radiologist" and was told that left rib fractures were "initially missed." Pt was given Valium and morphine IV at that visit and also states she was given Dilaudid right before her IV was removed and was discharged "without the Dilaudid being charted because it didn't show up on her discharge paperwork." Pt has had some nausea with the pain and states it is worse with deep breathing, movement. Pt has also had some cough with greenish phlegm and some blood flecks in it for several days. Pt denies injury but does state she passed out at a movie theater two days ago and then later got dizzy and had a fall that same day. Pain has not been worse since then but is unchanged.  Of note, pt has been seen at [facility] (6/23) and [facility] (6/25 and 7/3), with very similar complaints and negative work-up, including negative CTA with PE protocol at [facility]. See previous notes and Dr. Sharman Cheek MDM copied, below. 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 No language interpreter was used.   History reviewed. No pertinent past medical history. Past Surgical History  Procedure Laterality Date  . Hip arthroplasty    . Abdominal hysterectomy    . Mass off appendix     History reviewed. No pertinent family history. History  Substance Use Topics  . Smoking status: Never Smoker   . Smokeless tobacco: Never Used  . Alcohol Use: No   OB History   Grav Para Term Preterm Abortions TAB SAB Ect Mult Living                 Review of Systems  Constitutional: Negative for fever and chills.  HENT: Positive for neck pain.   Respiratory: Positive for choking. Negative for cough and shortness of breath.   Cardiovascular: Positive for chest pain. Negative for leg swelling.  Gastrointestinal: Positive for nausea. Negative for vomiting, abdominal pain and constipation.  Genitourinary: Negative for dysuria, urgency, frequency and hematuria.  Musculoskeletal: Negative for back pain.  Skin: Negative for rash and wound.  Neurological: Positive for headaches. Negative for dizziness, syncope and weakness.       Baseline, pseudotumor cerebrii  Psychiatric/Behavioral: Negative for confusion. The patient is not nervous/anxious.   All other systems reviewed and are negative.    Allergies  Darvocet; Eggs or egg-derived products; and Gabapentin  Home Medications   Current Outpatient Rx  Name  Route  Sig  Dispense  Refill  . acetaminophen (TYLENOL) 500 MG tablet   Oral   Take 500 mg by mouth every 6 (six) hours as needed for pain.         Marland Kitchen acetaZOLAMIDE (DIAMOX) 500 MG capsule   Oral   Take 500 mg by mouth 4 (four) times daily.          . benzonatate (TESSALON) 200 MG capsule   Oral   Take 1 capsule (200 mg total) by mouth 3 (three) times daily as needed for cough.   20 capsule   0   . diazepam (VALIUM) 5 MG  tablet   Oral   Take 1 tablet (5 mg total) by mouth every 8 (eight) hours as needed (muscle spasm).   15 tablet   0   . oxyCODONE-acetaminophen (PERCOCET/ROXICET) 5-325 MG per tablet   Oral   Take 1-2 tablets by mouth every 6 (six) hours as needed for pain.   20 tablet   0   . PRESCRIPTION MEDICATION   Both Eyes   Place into both eyes daily. RX Drop from [facility],         . QUEtiapine (SEROQUEL) 50 MG tablet   Oral   Take 50-150 mg by mouth at bedtime. Pt takes 50 mg daily with an additional 150 mg to relive migraine when needed         . ondansetron (ZOFRAN) 4 MG tablet   Oral   Take 1 tablet (4 mg total) by mouth every 8 (eight) hours as needed for nausea.   30 tablet   0   . tizanidine (ZANAFLEX) 2 MG capsule   Oral   Take 1 capsule (2 mg total) by mouth 3 (three) times daily as needed for muscle spasms.   30 capsule   0   . traMADol (ULTRAM) 50 MG tablet   Oral   Take 1 tablet (50 mg total) by mouth every 6 (six) hours as needed for pain.   50 tablet   0    BP 120/71  Pulse 77  Temp(Src) 97.6 F (36.4 C) (Oral)  Resp 18  SpO2 97% Physical Exam  ED Course  Procedures (including critical care time)   Date: 11/14/2012  Rate: 97  Rhythm: normal sinus rhythm  QRS Axis: normal  Intervals: normal  ST/T Wave abnormalities: diffuse flattened T-waves, inverted T-wave in III  Conduction Disutrbances: none  Narrative Interpretation: NSR with some nonspecific T-wave changes, but no frank ischemia  Old EKG Reviewed: No significant changes noted  Labs Reviewed  CBC WITH DIFFERENTIAL - Abnormal; Notable for the following:    Hemoglobin 11.9 (*)    HCT 35.6 (*)    All other components within normal limits  COMPREHENSIVE METABOLIC PANEL - Abnormal; Notable for the following:    Glucose, Bld 114 (*)    ALT 39 (*)    Total Bilirubin 0.1 (*)    All other components within normal limits  TROPONIN I  URINE RAPID DRUG SCREEN (HOSP PERFORMED)   1745:  History and exam as above. CXR here negative, EKG unchanged. On chart review, pt has been seen multiple times with similar complaints and some inconsistencies/unusual statements in her history. From Dr. Sharman Cheek last ED note 7/3: 42 yo female with persistent chest pain x 2 weeks. Noted that chest xray was compared to chest CT scan on 6/23. Pt was initially adamant that she had  not had a chest CT or been seen at any outside hospital prior to her visit on 6/25 at [facility]. D/w radiologist who reports pt had chest xray and chest CT scan, PE protocol, done at [facility] on 6/23. [place] Controlled substance database shows percocet prescription #25 on that date. Workup here again unremarkable. Pain reproducible with palpation. Possible chest wall strain. Plan was to tx with valium, toradol as patient reports no improvement with percocet. Concern for malingering/drug seeking behavior given mult ed visits. On re-eval pt reports no improvement. She now remembers going to [place] when I confronted her again about her workup. No mention of this visit on Dr Boykin Reaper note. As patient has received 2 percocet prescriptions in the past week, will not give further. Will give short course of valium. Pt encouraged to f/u with pcm with possible PT referral. Pt reviewed in  narcotics database. Percocet and Valium prescriptions shown but no other narcotic or unusual/multiple Rx's seen.  1830: Discussed with radiologist, Dr. Kearney Hard. No record of phone call to pt about rib fractures, no rib fractures or other abnormality noted on CXR here at [facility] today, on CXR 7/3 at [facility], on CXR 6/25 at [facility], or on CXR or CTA at [facility] on 6/23. Discussed with pt who reiterates that someone called her and told her that rib fractures were seen on her CXR done on 7/3. Apologized that this occurred but reassured pt that she does not appear to have any rib fractures now or on previous imaging. Originally had ordered for morphine but will dose with  Toradol and reassess. Will also check UDS.  1915: No improvement of pain with Toradol. UDS pending. Will f/u labs and give Zofran IV and Fentanyl 100 mcg IV x1 now.  2000: Troponin negative. Other labs show no significant abnormalities. UDS also negative.  Dg Chest 2 View  11/14/2012   *RADIOLOGY REPORT*  Clinical Data: Dizziness, chest pain.  CHEST - 2 VIEW  Comparison: 11/08/2012  Findings: Heart and mediastinal contours are within normal limits. No focal opacities or effusions.  No acute bony abnormality.  IMPRESSION: No active cardiopulmonary disease.   Original Report Authenticated By: Charlett Nose, M.D.   1. Non-cardiac chest pain     MDM  42yo female with left-sided chest pain that appears MSK in nature. Some inconsistencies in pt's history with concerns for exaggeration of symptoms as above. EKG unchanged, troponin negative. Labs otherwise unremarkable and UDS negative. Impression of MSK pain but pt with intolerance to NSAID's in the past. Of note, UDS was negative and pt stated she took Percocet "yesterday."  Pt also at the very end of my last contact with her requested Rx for Percocet, then Valium when I told her I preferred to Rx Tramadol alone. Pt has used Zanaflex in the past with relief and was agreeable to short course Rx. Will Rx Tramadol, Zofran, Zanaflex, and advise establishment of PCP care for coordination of this and chronic issues.  The above was discussed in its entirety with attending ED physician Dr. Radford Pax.   Bobbye Morton, MD  PGY-2, [facility] Medicine  Bobbye Morton, MD 11/14/12 2026

## 2012-11-14 NOTE — ED Notes (Signed)
Pt at registration desk c/o dizzness/pain under lt breast/lt arm, nausea x3wks, states seen last wk for same. Pt in no distress, pt speaking in complete sentences at this time. Pt states she does have 2 cracked ribs on lt side.

## 2012-11-15 NOTE — ED Provider Notes (Signed)
I saw and evaluated the patient, reviewed the resident's note and I agree with the findings and plan.   .Face to face Exam:  General:  Awake HEENT:  Atraumatic Resp:  Normal effort Abd:  Nondistended Neuro:No focal weakness    Nelia Shi, MD 11/15/12 1527

## 2013-01-06 ENCOUNTER — Inpatient Hospital Stay (HOSPITAL_COMMUNITY)
Admission: AD | Admit: 2013-01-06 | Discharge: 2013-01-06 | Disposition: A | Discharge status: Home / Self Care | Payer: Medicaid Other | Source: Ambulatory Visit | Attending: Obstetrics & Gynecology | Admitting: Obstetrics & Gynecology

## 2013-01-06 ENCOUNTER — Inpatient Hospital Stay (HOSPITAL_COMMUNITY): Payer: Self-pay

## 2013-01-06 ENCOUNTER — Encounter (HOSPITAL_COMMUNITY): Payer: Self-pay | Admitting: *Deleted

## 2013-01-06 DIAGNOSIS — N993 Prolapse of vaginal vault after hysterectomy: Secondary | ICD-10-CM | POA: Insufficient documentation

## 2013-01-06 DIAGNOSIS — N819 Female genital prolapse, unspecified: Secondary | ICD-10-CM

## 2013-01-06 HISTORY — DX: Inflammatory disease of uterus, unspecified: N71.9

## 2013-01-06 HISTORY — DX: Polycystic ovarian syndrome: E28.2

## 2013-01-06 HISTORY — DX: Endometriosis, unspecified: N80.9

## 2013-01-06 LAB — URINE MICROSCOPIC-ADD ON

## 2013-01-06 LAB — URINALYSIS, ROUTINE W REFLEX MICROSCOPIC
Bilirubin Urine: NEGATIVE
Glucose, UA: NEGATIVE mg/dL
Ketones, ur: NEGATIVE mg/dL
Nitrite: NEGATIVE
Specific Gravity, Urine: >1.03 — ABNORMAL HIGH (ref 1.005–1.030)
pH: 5 (ref 5.0–8.0)

## 2013-01-06 MED ORDER — OXYCODONE-ACETAMINOPHEN 5-325 MG PO TABS
2.0000 | ORAL_TABLET | Freq: Once | ORAL | Status: AC
  Administered 2013-01-06: 2 via ORAL
  Filled 2013-01-06: qty 2

## 2013-01-06 MED ORDER — KETOROLAC TROMETHAMINE 60 MG/2ML IM SOLN
60.0000 mg | Freq: Once | INTRAMUSCULAR | Status: AC
  Administered 2013-01-06: 60 mg via INTRAMUSCULAR
  Filled 2013-01-06: qty 2

## 2013-01-06 NOTE — MAU Note (Signed)
Pt informed Dr. Marice Potter in delivery and then going to C/S, willing to come see her in MAU however may be 2 hrs, Pt wants to leave without pelvic and will follow up with MD in clinic 2 [facility]

## 2013-01-06 NOTE — MAU Note (Signed)
Hx hysterectomy, reports prolapse after digital intercourse and followed by bleeding, cramping, severe pain

## 2013-01-06 NOTE — MAU Provider Note (Signed)
History     CSN: [id number]  Arrival date and time: 01/06/13 1530   First Provider Initiated Contact with Patient 01/06/13 1625      Chief Complaint  Patient presents with  . Vaginal Prolapse   HPI  Tammy Robles is a 42 y.o.female who presents with a possible prolapsed bladder. She had a total hysterectomy in 1996. Last night she had "digintal intercourse" that was exteremly painful, following this she noticed tissue on her toilet paper and blood. Today she went to wipe and felt a ball in her vagina and could not get her urine to come out. She is able to urinate however it feels like the urine is coming from the side of her vagina.  She does not have a GYN doctor at this time, she currently lives in [place] and plans to stay in the area.  Her pain is in her vaginal area and back. She rates her pain an 8/10  OB History   Grav Para Term Preterm Abortions TAB SAB Ect Mult Living   3 2   1  1   2       Past Medical History  Diagnosis Date  . Polycystic ovarian disease 1993  . Endometriosis 1996  . Endometritis 1990    fever 105 post delivery    Past Surgical History  Procedure Laterality Date  . Hip arthroplasty    . Abdominal hysterectomy    . Mass off appendix    . Hysterrectomy  1996  . 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 Right oophorectomy    . Left oophorectomy  2007  . Wrist surgery  2007 and 2003    both tendon release  . Laparotomy      multiple for cysts    History reviewed. No pertinent family history.  History  Substance Use Topics  . Smoking status: Never Smoker   . Smokeless tobacco: Never Used  . Alcohol Use: No    Allergies:  Allergies  Allergen Reactions  . Darvocet [Propoxyphene-Acetaminophen] Nausea And Vomiting  . Eggs Or Egg-Derived Products Nausea And Vomiting  . Gabapentin Nausea And Vomiting    Prescriptions prior to admission  Medication Sig Dispense Refill  . acetaminophen (TYLENOL)  500 MG tablet Take 500 mg by mouth every 6 (six) hours as needed for pain.      Marland Kitchen acetaZOLAMIDE (DIAMOX) 500 MG capsule Take 500 mg by mouth 4 (four) times daily.       Marland Kitchen PRESCRIPTION MEDICATION Place into both eyes daily. RX Drop from [facility],      . QUEtiapine (SEROQUEL) 50 MG tablet Take 50-150 mg by mouth at bedtime. Pt takes 50 mg daily with an additional 150 mg to relive migraine when needed      . tizanidine (ZANAFLEX) 2 MG capsule Take 1 capsule (2 mg total) by mouth 3 (three) times daily as needed for muscle spasms.  30 capsule  0  . traMADol (ULTRAM) 50 MG tablet Take 1 tablet (50 mg total) by mouth every 6 (six) hours as needed for pain.  50 tablet  0  US Pelvis Complete  01/06/2013   *RADIOLOGY REPORT*  Clinical Data: Bladder prolapse and status post prior hysterectomy and oopherectomy.  TRANSABDOMINAL ULTRASOUND OF PELVIS  Technique:  Transabdominal ultrasound examination of the pelvis was performed including evaluation of the uterus, ovaries, adnexal regions, and pelvic cul-de-sac.  Comparison:  None.  Findings:  Uterus:  Surgically absent.  Right ovary: Surgically absent.  Left ovary: Surgically absent.  Other Findings:  Bladder prolapse is only evident with translabial examination.  There does appear to be moderate prolapse.  No other bladder abnormality is identified.  No free fluid is seen in the pelvis.  IMPRESSION: Moderate bladder prolapse identified by ultrasound, especially with translabial examination.   Original Report Authenticated By: Irish Lack, M.D.    Review of Systems  Constitutional: Positive for chills. Negative for fever.  Gastrointestinal: Positive for nausea and abdominal pain. Negative for vomiting, diarrhea and constipation.       Lower abdominal pain/cramping   Genitourinary: Negative for dysuria and urgency.       No vaginal discharge. Vaginal bleeding; bright red, has slowed down since yesterday. No blood clots.  No dysuria; pressure with urination      Physical Exam   Blood pressure 113/57, pulse 91, temperature 97.1 F (36.2 C), resp. rate 20, SpO2 97.00%.  Physical Exam  Constitutional: She is oriented to person, place, and time. She appears well-developed and well-nourished. No distress.  Neck: Neck supple.  Respiratory: No respiratory distress.  Genitourinary:  Golf ball size bulging tissue at vaginal introitus, unable to palpate or examine fully due to patients discomfort. Tissue is pink, tender to touch, no sign of skin breakdown, or bleeding. I offered to given percocet and examine patient fully and patient declined    Neurological: She is alert and oriented to person, place, and time.  Skin: Skin is warm. She is not diaphoretic.    MAU Course  Procedures  MDM Toradol 60 mg IM times 1 dose  Percocet 2 tabs in MAU  Consulted with Dr. Marice Potter regarding patients pain and complaints of prolapsed bladder.  Pt requested a prescription for pain medication for discharge and I notified her that I could not prescribe her narcotics that I would give her 2 percocet in MAU and she could take Ultram and ibuprofen as needed. Pt requests to speak to Dr. Marice Potter. Consulted with Dr. Marice Potter regarding patients request; Dr. Marice Potter is finishing a delivery and would be down to see the patient.  Pt unable to wait for Dr. Marice Potter, declines pelvic exam/ bimanual exam at this time. She is requesting discharge and referral the clinic.   Assessment and Plan  A: Pelvic floor prolapse     P: Discharge home Referral made to GYN clinic; requested to see Dr. Archie Patten to take tylenol as directed on the bottle Ok to take ibuprofen as directed on the bottle Return to MAU if symptoms worsen   Tammy Fifer IRENE FNP-C 01/06/2013, 7:24 PM

## 2013-01-06 NOTE — MAU Note (Signed)
Pt unsatisfied with only percocet x 1 dose and no Rx for after visit.  Request Dr. Marice Potter to see her, I informed pt Tammy Carbon, NP had been consulting with Dr. Marice Potter throughout her stay today.  Offer to wait 15 minutes or more prior to pelvic exam /NP, to let medication work, she declined the wait, NP informed of this note's content

## 2013-01-07 LAB — URINE CULTURE
Colony Count: NO GROWTH
Culture: NO GROWTH

## 2013-02-11 ENCOUNTER — Encounter: Payer: Medicaid Other | Admitting: Obstetrics & Gynecology

## 2013-03-29 ENCOUNTER — Emergency Department: Payer: Self-pay | Admitting: Emergency Medicine

## 2013-03-29 LAB — URINALYSIS, COMPLETE
Bilirubin,UR: NEGATIVE
Ph: 5 (ref 4.5–8.0)
Specific Gravity: 1.021 (ref 1.003–1.030)

## 2013-05-28 ENCOUNTER — Emergency Department: Payer: Self-pay | Admitting: Emergency Medicine

## 2013-07-02 ENCOUNTER — Emergency Department: Payer: Self-pay | Admitting: Emergency Medicine

## 2013-07-02 LAB — COMPREHENSIVE METABOLIC PANEL
ALT: 21 U/L (ref 12–78)
Albumin: 4.2 g/dL (ref 3.4–5.0)
Alkaline Phosphatase: 98 U/L
Anion Gap: 3 — ABNORMAL LOW (ref 7–16)
BILIRUBIN TOTAL: 0.3 mg/dL (ref 0.2–1.0)
BUN: 14 mg/dL (ref 7–18)
CO2: 33 mmol/L — AB (ref 21–32)
Calcium, Total: 9.6 mg/dL (ref 8.5–10.1)
Chloride: 105 mmol/L (ref 98–107)
Creatinine: 1 mg/dL (ref 0.60–1.30)
GLUCOSE: 92 mg/dL (ref 65–99)
Osmolality: 281 (ref 275–301)
POTASSIUM: 3.9 mmol/L (ref 3.5–5.1)
SGOT(AST): 21 U/L (ref 15–37)
Sodium: 141 mmol/L (ref 136–145)
TOTAL PROTEIN: 8.1 g/dL (ref 6.4–8.2)

## 2013-07-02 LAB — URINALYSIS, COMPLETE
BACTERIA: NONE SEEN
BLOOD: NEGATIVE
Glucose,UR: NEGATIVE mg/dL (ref 0–75)
LEUKOCYTE ESTERASE: NEGATIVE
Nitrite: NEGATIVE
PH: 5 (ref 4.5–8.0)
PROTEIN: NEGATIVE
Specific Gravity: 1.035 (ref 1.003–1.030)
Squamous Epithelial: 13
WBC UR: 3 /HPF (ref 0–5)

## 2013-07-02 LAB — CBC
HCT: 39.9 % (ref 35.0–47.0)
HGB: 13.5 g/dL (ref 12.0–16.0)
MCH: 30.8 pg (ref 26.0–34.0)
MCHC: 33.9 g/dL (ref 32.0–36.0)
MCV: 91 fL (ref 80–100)
Platelet: 320 10*3/uL (ref 150–440)
RBC: 4.39 10*6/uL (ref 3.80–5.20)
RDW: 13.8 % (ref 11.5–14.5)
WBC: 7.7 10*3/uL (ref 3.6–11.0)

## 2013-10-09 ENCOUNTER — Emergency Department: Payer: Self-pay | Admitting: Emergency Medicine

## 2014-03-10 ENCOUNTER — Encounter (HOSPITAL_COMMUNITY): Payer: Self-pay | Admitting: *Deleted

## 2014-08-25 ENCOUNTER — Emergency Department (HOSPITAL_COMMUNITY)
Admission: EM | Admit: 2014-08-25 | Discharge: 2014-08-25 | Disposition: A | Discharge status: Home / Self Care | Payer: Medicaid Other | Attending: Emergency Medicine | Admitting: Emergency Medicine

## 2014-08-25 ENCOUNTER — Emergency Department (HOSPITAL_COMMUNITY): Payer: Medicaid Other

## 2014-08-25 ENCOUNTER — Encounter (HOSPITAL_COMMUNITY): Payer: Self-pay | Admitting: *Deleted

## 2014-08-25 DIAGNOSIS — Z8639 Personal history of other endocrine, nutritional and metabolic disease: Secondary | ICD-10-CM | POA: Insufficient documentation

## 2014-08-25 DIAGNOSIS — R52 Pain, unspecified: Secondary | ICD-10-CM

## 2014-08-25 DIAGNOSIS — Y9389 Activity, other specified: Secondary | ICD-10-CM | POA: Insufficient documentation

## 2014-08-25 DIAGNOSIS — W1839XA Other fall on same level, initial encounter: Secondary | ICD-10-CM | POA: Insufficient documentation

## 2014-08-25 DIAGNOSIS — Z720 Tobacco use: Secondary | ICD-10-CM | POA: Insufficient documentation

## 2014-08-25 DIAGNOSIS — Y998 Other external cause status: Secondary | ICD-10-CM | POA: Insufficient documentation

## 2014-08-25 DIAGNOSIS — Z79899 Other long term (current) drug therapy: Secondary | ICD-10-CM | POA: Insufficient documentation

## 2014-08-25 DIAGNOSIS — S8391XA Sprain of unspecified site of right knee, initial encounter: Secondary | ICD-10-CM

## 2014-08-25 DIAGNOSIS — M1990 Unspecified osteoarthritis, unspecified site: Secondary | ICD-10-CM | POA: Insufficient documentation

## 2014-08-25 DIAGNOSIS — Z87448 Personal history of other diseases of urinary system: Secondary | ICD-10-CM | POA: Insufficient documentation

## 2014-08-25 DIAGNOSIS — M1611 Unilateral primary osteoarthritis, right hip: Secondary | ICD-10-CM

## 2014-08-25 DIAGNOSIS — Y9289 Other specified places as the place of occurrence of the external cause: Secondary | ICD-10-CM | POA: Insufficient documentation

## 2014-08-25 MED ORDER — IBUPROFEN 800 MG PO TABS
800.0000 mg | ORAL_TABLET | Freq: Once | ORAL | Status: AC
  Administered 2014-08-25: 800 mg via ORAL
  Filled 2014-08-25: qty 1

## 2014-08-25 MED ORDER — ACETAMINOPHEN 325 MG PO TABS
650.0000 mg | ORAL_TABLET | Freq: Once | ORAL | Status: AC
  Administered 2014-08-25: 650 mg via ORAL
  Filled 2014-08-25: qty 2

## 2014-08-25 NOTE — ED Provider Notes (Signed)
CSN: [id number]     Arrival date & time 08/25/14  0211 History   First MD Initiated Contact with Patient 08/25/14 0231     Chief Complaint  Patient presents with  . Fall    Patient is a 44 y.o. female presenting with fall. The history is provided by the patient.  Fall This is a new problem. The current episode started 6 to 12 hours ago. The problem occurs constantly. The problem has been gradually worsening. Pertinent negatives include no headaches. The symptoms are aggravated by walking. The symptoms are relieved by rest.  pt reports she tripped earlier and fell directly on right knee.  No head injury and no LOC She reports she also has right hip pain after fall She has h/o chronic right hip pain and reports it is "bone to bone" but she currently has no orthopedist    Past Medical History  Diagnosis Date  . Polycystic ovarian disease 1993  . Endometriosis 1996  . Endometritis 1990    fever 105 post delivery   Past Surgical History  Procedure Laterality Date  . Hip arthroplasty    . Abdominal hysterectomy    . Mass off appendix    . Hysterrectomy  1996  . 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 Right oophorectomy    . Left oophorectomy  2007  . Wrist surgery  2007 and 2003    both tendon release  . Laparotomy      multiple for cysts   History reviewed. No pertinent family history. History  Substance Use Topics  . Smoking status: Current Some Day Smoker -- 0.50 packs/day  . Smokeless tobacco: Never Used  . Alcohol Use: No   OB History    Gravida Para Term Preterm AB TAB SAB Ectopic Multiple Living   Review of Systems  Musculoskeletal: Positive for arthralgias.  Neurological: Negative for weakness, numbness and headaches.      Allergies  Darvocet; Eggs or egg-derived products; and Gabapentin  Home Medications   Prior to Admission medications   Medication Sig Start Date End Date Taking? Authorizing  Provider  bisacodyl (DULCOLAX) 5 MG EC tablet Take 5 mg by mouth daily as needed for moderate constipation.   Yes Historical Provider, MD  clonazePAM (KLONOPIN) 0.5 MG tablet Take 0.5 mg by mouth 2 (two) times daily as needed for anxiety.   Yes Historical Provider, MD  doxepin (SINEQUAN) 25 MG capsule Take 25 mg by mouth at bedtime.   Yes Historical Provider, MD  FLUoxetine (PROZAC) 40 MG capsule Take 80 mg by mouth daily.   Yes Historical Provider, MD  lamoTRIgine (LAMICTAL) 200 MG tablet Take 200 mg by mouth at bedtime.   Yes Historical Provider, MD  PRESCRIPTION MEDICATION Place into both eyes daily. RX Drop from [facility],   Yes Historical Provider, MD  QUEtiapine (SEROQUEL) 50 MG tablet Take 50-150 mg by mouth at bedtime. Pt takes 50 mg daily with an additional 150 mg to relive migraine when needed   Yes Historical Provider, MD  acetaminophen (TYLENOL) 500 MG tablet Take 500 mg by mouth every 6 (six) hours as needed for pain.    Historical Provider, MD  tizanidine (ZANAFLEX) 2 MG capsule Take 1 capsule (2 mg total) by mouth 3 (three) times daily as needed for muscle spasms. 11/14/12   Bobbye Morton, MD  traMADol (  ULTRAM) 50 MG tablet Take 1 tablet (50 mg total) by mouth every 6 (six) hours as needed for pain. 11/14/12   Stephanie Couphristopher M Street, MD   BP 113/69 mmHg  Pulse 78  Temp(Src) 97.6 F (36.4 C) (Oral)  Resp 18  Ht 5\' 7"  (1.702 m)  Wt 260 lb (117.935 kg)  BMI 40.71 kg/m2  SpO2 98% Physical Exam CONSTITUTIONAL: Well developed/well nourished HEAD: Normocephalic/atraumatic ENMT: Mucous membranes moist NECK: supple no meningeal signs SPINE/BACK:entire spine nontender CV: S1/S2 noted, no murmurs/rubs/gallops noted LUNGS: Lungs are clear to auscultation bilaterally, no apparent distress ABDOMEN: soft, nontender, no rebound or guarding, bowel sounds noted throughout abdomen NEURO: Pt is awake/alert/appropriate, moves all extremitiesx4.  No facial droop.   EXTREMITIES: pulses  normal/equal, full ROM.  Mild tenderness to palpation of right knee.  She has pain with ROM of right hip/knee.  No deformity.  No visible signs of trauma.   SKIN: warm, color normal PSYCH: no abnormalities of mood noted, alert and oriented to situation  ED Course  Procedures   Medications  ibuprofen (ADVIL,MOTRIN) tablet 800 mg (800 mg Oral Given 08/25/14 0244)  acetaminophen (TYLENOL) tablet 650 mg (650 mg Oral Given 08/25/14 0351)    Imaging Review Dg Knee Complete 4 Views Right  08/25/2014   CLINICAL DATA:  Right hip pain after fall.  Right knee swelling.  EXAM: RIGHT KNEE - COMPLETE 4+ VIEW  COMPARISON:  02/12/2008  FINDINGS: Mild tricompartmental degenerative changes. No displaced fracture or dislocation. Suboptimal lateral positioning without overt joint effusion.  IMPRESSION: Mild tricompartmental degenerative changes of the right knee. No acute osseous finding.   Electronically Signed   By: Jearld LeschAndrew  DelGaizo M.D.   On: 08/25/2014 03:44   Dg Hip Unilat With Pelvis 2-3 Views Right  08/25/2014   CLINICAL DATA:  Right hip pain after fall.  EXAM: RIGHT HIP (WITH PELVIS) 2-3 VIEWS  COMPARISON:  11/15/2009  FINDINGS: Moderate right hip osteoarthritis. Evidence of prior proximal right femur screws, with screw tracks. No displaced acute fracture or dislocation. Surgical clips project over the right lower pelvis. Lower lumbar degenerative changes. Overlying bowel loops partially obscure the sacrum.  IMPRESSION: Moderate right hip osteoarthritis.  No acute osseous finding.   Electronically Signed   By: Jearld LeschAndrew  DelGaizo M.D.   On: 08/25/2014 03:47   3:57 AM Imaging negative No visible signs of trauma on my evaluation She can ambulate Advised need for outpatient followup and orthopedic/PCP info given to patient Advised use of NSAIDs for pain at home   MDM   Final diagnoses:  Pain  Arthritis of right hip  Right knee sprain, initial encounter    Nursing notes including past medical history  and social history reviewed and considered in documentation Narcotic database reviewed and considered in decision making xrays/imaging reviewed by myself and considered during evaluation     Zadie Rhineonald Ledonna Dormer, MD 08/25/14 [phone]

## 2014-08-25 NOTE — Discharge Instructions (Signed)
Arthritis, Nonspecific °Arthritis is inflammation of a joint. This usually means pain, redness, warmth or swelling are present. One or more joints may be involved. There are a number of types of arthritis. Your caregiver may not be able to tell what type of arthritis you have right away. °CAUSES  °The most common cause of arthritis is the wear and tear on the joint (osteoarthritis). This causes damage to the cartilage, which can break down over time. The knees, hips, back and neck are most often affected by this type of arthritis. °Other types of arthritis and common causes of joint pain include: °· Sprains and other injuries near the joint. Sometimes minor sprains and injuries cause pain and swelling that develop hours later. °· Rheumatoid arthritis. This affects hands, feet and knees. It usually affects both sides of your body at the same time. It is often associated with chronic ailments, fever, weight loss and general weakness. °· Crystal arthritis. Gout and pseudo gout can cause occasional acute severe pain, redness and swelling in the foot, ankle, or knee. °· Infectious arthritis. Bacteria can get into a joint through a break in overlying skin. This can cause infection of the joint. Bacteria and viruses can also spread through the blood and affect your joints. °· Drug, infectious and allergy reactions. Sometimes joints can become mildly painful and slightly swollen with these types of illnesses. °SYMPTOMS  °· Pain is the main symptom. °· Your joint or joints can also be red, swollen and warm or hot to the touch. °· You may have a fever with certain types of arthritis, or even feel overall ill. °· The joint with arthritis will hurt with movement. Stiffness is present with some types of arthritis. °DIAGNOSIS  °Your caregiver will suspect arthritis based on your description of your symptoms and on your exam. Testing may be needed to find the type of arthritis: °· Blood and sometimes urine tests. °· X-ray tests  and sometimes CT or MRI scans. °· Removal of fluid from the joint (arthrocentesis) is done to check for bacteria, crystals or other causes. Your caregiver (or a specialist) will numb the area over the joint with a local anesthetic, and use a needle to remove joint fluid for examination. This procedure is only minimally uncomfortable. °· Even with these tests, your caregiver may not be able to tell what kind of arthritis you have. Consultation with a specialist (rheumatologist) may be helpful. °TREATMENT  °Your caregiver will discuss with you treatment specific to your type of arthritis. If the specific type cannot be determined, then the following general recommendations may apply. °Treatment of severe joint pain includes: °· Rest. °· Elevation. °· Anti-inflammatory medication (for example, ibuprofen) may be prescribed. Avoiding activities that cause increased pain. °· Only take over-the-counter or prescription medicines for pain and discomfort as recommended by your caregiver. °· Cold packs over an inflamed joint may be used for 10 to 15 minutes every hour. Hot packs sometimes feel better, but do not use overnight. Do not use hot packs if you are diabetic without your caregiver's permission. °· A cortisone shot into arthritic joints may help reduce pain and swelling. °· Any acute arthritis that gets worse over the next 1 to 2 days needs to be looked at to be sure there is no joint infection. °Long-term arthritis treatment involves modifying activities and lifestyle to reduce joint stress jarring. This can include weight loss. Also, exercise is needed to nourish the joint cartilage and remove waste. This helps keep the muscles   around the joint strong. °HOME CARE INSTRUCTIONS  °· Do not take aspirin to relieve pain if gout is suspected. This elevates uric acid levels. °· Only take over-the-counter or prescription medicines for pain, discomfort or fever as directed by your caregiver. °· Rest the joint as much as  possible. °· If your joint is swollen, keep it elevated. °· Use crutches if the painful joint is in your leg. °· Drinking plenty of fluids may help for certain types of arthritis. °· Follow your caregiver's dietary instructions. °· Try low-impact exercise such as: °¨ Swimming. °¨ Water aerobics. °¨ Biking. °¨ Walking. °· Morning stiffness is often relieved by a warm shower. °· Put your joints through regular range-of-motion. °SEEK MEDICAL CARE IF:  °· You do not feel better in 24 hours or are getting worse. °· You have side effects to medications, or are not getting better with treatment. °SEEK IMMEDIATE MEDICAL CARE IF:  °· You have a fever. °· You develop severe joint pain, swelling or redness. °· Many joints are involved and become painful and swollen. °· There is severe back pain and/or leg weakness. °· You have loss of bowel or bladder control. °Document Released: 06/02/2004 Document Revised: 07/18/2011 Document Reviewed: 06/18/2008 °[employer]® Patient Information ©2015 [employer]. This information is not intended to replace advice given to you by your health care provider. Make sure you discuss any questions you have with your health care provider. ° °

## 2014-08-25 NOTE — ED Notes (Signed)
Pt c/o right hip pain after falling earlier this evening; pt states she is having swelling to right knee and has previous hip pain; pt states she is due for a total hip replacement

## 2014-08-26 ENCOUNTER — Encounter (HOSPITAL_COMMUNITY): Payer: Self-pay | Admitting: Emergency Medicine

## 2014-08-26 ENCOUNTER — Emergency Department (HOSPITAL_COMMUNITY)
Admission: EM | Admit: 2014-08-26 | Discharge: 2014-08-27 | Disposition: A | Discharge status: Home / Self Care | Payer: Medicaid Other | Attending: Emergency Medicine | Admitting: Emergency Medicine

## 2014-08-26 ENCOUNTER — Emergency Department (HOSPITAL_COMMUNITY): Payer: Medicaid Other

## 2014-08-26 DIAGNOSIS — Z8639 Personal history of other endocrine, nutritional and metabolic disease: Secondary | ICD-10-CM | POA: Insufficient documentation

## 2014-08-26 DIAGNOSIS — W07XXXA Fall from chair, initial encounter: Secondary | ICD-10-CM | POA: Insufficient documentation

## 2014-08-26 DIAGNOSIS — Z720 Tobacco use: Secondary | ICD-10-CM | POA: Insufficient documentation

## 2014-08-26 DIAGNOSIS — S3993XA Unspecified injury of pelvis, initial encounter: Secondary | ICD-10-CM | POA: Insufficient documentation

## 2014-08-26 DIAGNOSIS — S199XXA Unspecified injury of neck, initial encounter: Secondary | ICD-10-CM | POA: Insufficient documentation

## 2014-08-26 DIAGNOSIS — Z91040 Latex allergy status: Secondary | ICD-10-CM | POA: Insufficient documentation

## 2014-08-26 DIAGNOSIS — Y998 Other external cause status: Secondary | ICD-10-CM | POA: Insufficient documentation

## 2014-08-26 DIAGNOSIS — Z87448 Personal history of other diseases of urinary system: Secondary | ICD-10-CM | POA: Insufficient documentation

## 2014-08-26 DIAGNOSIS — S4992XA Unspecified injury of left shoulder and upper arm, initial encounter: Secondary | ICD-10-CM | POA: Insufficient documentation

## 2014-08-26 DIAGNOSIS — Z880 Allergy status to penicillin: Secondary | ICD-10-CM | POA: Insufficient documentation

## 2014-08-26 DIAGNOSIS — Y9389 Activity, other specified: Secondary | ICD-10-CM | POA: Insufficient documentation

## 2014-08-26 DIAGNOSIS — S060X9A Concussion with loss of consciousness of unspecified duration, initial encounter: Secondary | ICD-10-CM | POA: Insufficient documentation

## 2014-08-26 DIAGNOSIS — Y9289 Other specified places as the place of occurrence of the external cause: Secondary | ICD-10-CM | POA: Insufficient documentation

## 2014-08-26 DIAGNOSIS — Z79899 Other long term (current) drug therapy: Secondary | ICD-10-CM | POA: Insufficient documentation

## 2014-08-26 DIAGNOSIS — M7918 Myalgia, other site: Secondary | ICD-10-CM

## 2014-08-26 DIAGNOSIS — S8992XA Unspecified injury of left lower leg, initial encounter: Secondary | ICD-10-CM | POA: Insufficient documentation

## 2014-08-26 DIAGNOSIS — W19XXXA Unspecified fall, initial encounter: Secondary | ICD-10-CM

## 2014-08-26 DIAGNOSIS — S3992XA Unspecified injury of lower back, initial encounter: Secondary | ICD-10-CM | POA: Insufficient documentation

## 2014-08-26 DIAGNOSIS — S8991XA Unspecified injury of right lower leg, initial encounter: Secondary | ICD-10-CM | POA: Insufficient documentation

## 2014-08-26 MED ORDER — ONDANSETRON HCL 4 MG/2ML IJ SOLN
4.0000 mg | Freq: Once | INTRAMUSCULAR | Status: AC
  Administered 2014-08-27: 4 mg via INTRAVENOUS
  Filled 2014-08-26: qty 2

## 2014-08-26 MED ORDER — MORPHINE SULFATE 4 MG/ML IJ SOLN
4.0000 mg | Freq: Once | INTRAMUSCULAR | Status: AC
  Administered 2014-08-27: 4 mg via INTRAVENOUS
  Filled 2014-08-26: qty 1

## 2014-08-26 NOTE — ED Notes (Signed)
Patient presents to ER via [facility] s/p fall while attempting to sit in lawn chair.  EMS states that friends at scene stated she had LOC for a few seconds.  Patient c/o left shoulder, neck and bilateral leg pain.  Patient A&O; respirations even and unlabored.

## 2014-08-26 NOTE — ED Provider Notes (Signed)
CSN: [id number]     Arrival date & time 08/26/14  2238 History  This chart was scribed for Shon Baton, MD by Tanda Rockers, ED Scribe. This patient was seen in room [facility] and the patient's care was started at 11:21 PM.    Chief Complaint  Patient presents with  . Fall   The history is provided by the patient. No language interpreter was used.     HPI Comments: Tammy Robles is a 44 y.o. female brought in by ambulance, who presents to the Emergency Department complaining of ground level fall that occurred earlier today. Pt sat in beach chair which tilted to the left, causing her to fall and hit the right side of her head. Pt admits to LOC for approximately 30 seconds to 1 minute. Pt also complains of nausea, neck pain, right pelvis pain,  right knee pain, left knee pain, back pain, and left shoulder pain. Pt states that most of her pain is in her neck, left shoulder, and back. Family mentions that pt has been intermittently confused since falling. Pt was seen in ED 1 day ago for another fall. She was prescribed Oxycodone at that time. Pt took 1 oxycodone earlier tonight for the pain. She denies EtOH use tonight.    Past Medical History  Diagnosis Date  . Polycystic ovarian disease 1993  . Endometriosis 1996  . Endometritis 1990    fever 105 post delivery   Past Surgical History  Procedure Laterality Date  . Hip arthroplasty    . Abdominal hysterectomy    . Mass off appendix    . Hysterrectomy  1996  . 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 Right oophorectomy    . Left oophorectomy  2007  . Wrist surgery  2007 and 2003    both tendon release  . Laparotomy      multiple for cysts   No family history on file. History  Substance Use Topics  . Smoking status: Current Some Day Smoker -- 0.50 packs/day  . Smokeless tobacco: Never Used  . Alcohol Use: No   OB History    Gravida Para Term Preterm AB TAB SAB Ectopic Multiple Living    Review of Systems  Constitutional: Negative for fever.  Respiratory: Negative for cough, chest tightness and shortness of breath.   Cardiovascular: Negative for chest pain.  Gastrointestinal: Negative for nausea, vomiting and abdominal pain.  Genitourinary: Negative for dysuria.  Musculoskeletal: Positive for back pain and neck pain.       Shoulder pain, lateral knee pain, right hip pain  Skin: Negative for wound.  Neurological: Positive for headaches. Negative for dizziness and numbness.  Psychiatric/Behavioral: Negative for confusion.  All other systems reviewed and are negative.     Allergies  Latex; Amoxicillin; Darvocet; Eggs or egg-derived products; Gabapentin; and Reglan  Home Medications   Prior to Admission medications   Medication Sig Start Date End Date Taking? Authorizing Provider  acetaminophen (TYLENOL) 500 MG tablet Take 500 mg by mouth every 6 (six) hours as needed for pain.   Yes Historical Provider, MD  bisacodyl (DULCOLAX) 5 MG EC tablet Take 5 mg by mouth daily as needed for moderate constipation.   Yes Historical Provider, MD  clonazePAM (KLONOPIN) 0.5 MG tablet Take 0.5 mg by mouth 2 (two) times daily as needed for anxiety.  Yes Historical Provider, MD  doxepin (SINEQUAN) 25 MG capsule Take 25 mg by mouth at bedtime.   Yes Historical Provider, MD  FLUoxetine (PROZAC) 40 MG capsule Take 80 mg by mouth daily.   Yes Historical Provider, MD  lamoTRIgine (LAMICTAL) 200 MG tablet Take 200 mg by mouth at bedtime.   Yes Historical Provider, MD  oxycodone (OXY-IR) 5 MG capsule Take 5-10 mg by mouth every 8 (eight) hours as needed for pain.   Yes Historical Provider, MD  QUEtiapine (SEROQUEL) 50 MG tablet Take 50-150 mg by mouth at bedtime. Pt takes 50 mg daily with an additional 150 mg to relive migraine when needed   Yes Historical Provider, MD  tizanidine (ZANAFLEX) 2 MG capsule Take 1 capsule (2 mg total) by mouth 3 (three) times daily as  needed for muscle spasms. 11/14/12  Yes Stephanie Couphristopher M Street, MD  ibuprofen (ADVIL,MOTRIN) 600 MG tablet Take 1 tablet (600 mg total) by mouth every 6 (six) hours as needed. 08/27/14   Shon Batonourtney F Mariangela Heldt, MD  traMADol (ULTRAM) 50 MG tablet Take 1 tablet (50 mg total) by mouth every 6 (six) hours as needed for pain. Patient not taking: Reported on 08/26/2014 11/14/12   Stephanie Couphristopher M Street, MD   Triage Vitals: BP 118/64 mmHg  Pulse 77  Temp(Src) 98.2 F (36.8 C) (Oral)  Resp 20  Ht 5\' 7"  (1.702 m)  Wt 250 lb (113.399 kg)  BMI 39.15 kg/m2  SpO2 98%    Physical Exam  Constitutional: She is oriented to person, place, and time. No distress.  Boarded and collared, ABCs intact, morbidly obese  HENT:  Head: Normocephalic and atraumatic.  Eyes: Pupils are equal, round, and reactive to light.  Neck: Neck supple.  No midline C-spine tenderness  Cardiovascular: Normal rate, regular rhythm and normal heart sounds.   Pulmonary/Chest: Effort normal and breath sounds normal. No respiratory distress. She has no wheezes.  Abdominal: Soft. Bowel sounds are normal. There is no tenderness. There is no rebound and no guarding.  Musculoskeletal:  Normal range of motion of bilateral hips and knees, no obvious shortening, reports pain with range of motion of the right hip and knee, also pain with range of motion of the right shoulder, no obvious clavicular deformity or before meals joint tenderness  Neurological: She is alert and oriented to person, place, and time.  Skin: Skin is warm and dry.  Psychiatric: She has a normal mood and affect.  Nursing note and vitals reviewed.   ED Course  Procedures (including critical care time)  DIAGNOSTIC STUDIES: Oxygen Saturation is 98% on RA, normal by my interpretation.    COORDINATION OF CARE: 11:26 PM-Discussed treatment plan which includes pain medication and X rays with pt at bedside and pt agreed to plan.   Labs Review Labs Reviewed - No data to  display  Imaging Review Dg Chest 2 View  08/27/2014   CLINICAL DATA:  Stat in chair, falling over. Left shoulder and right chest pain.  EXAM: CHEST  2 VIEW  COMPARISON:  11/14/2012  FINDINGS: Heart size is at the upper limits of normal. Mediastinal shadows are normal. The lungs are clear. Poor inspiration. No effusion. No bony abnormality.  IMPRESSION: No active cardiopulmonary disease.   Electronically Signed   By: Paulina FusiMark  Shogry M.D.   On: 08/27/2014 01:22   Dg Cervical Spine Complete  08/27/2014   CLINICAL DATA:  Facet down on beach chair tonight, falling over. Right-sided neck pain.  EXAM: CERVICAL SPINE  4+  VIEWS  COMPARISON:  None.  FINDINGS: Alignment is normal. No fracture. No soft tissue swelling. No significant degenerative changes.  IMPRESSION: Negative cervical spine radiographs.   Electronically Signed   By: Paulina Fusi M.D.   On: 08/27/2014 01:20   Dg Pelvis 1-2 Views  08/27/2014   CLINICAL DATA:  Sat in chair, falling over. History of old hip fracture on the right. Right-sided pelvic and hip pain.  EXAM: PELVIS - 1-2 VIEW  COMPARISON:  None.  FINDINGS: No pelvic fracture. There is arthritis of the right hip. There is an old healed fracture of the right femoral neck. Shadows from previous Knowles pins are noted. No evidence of pelvic fracture acutely.  IMPRESSION: No acute finding. Osteoarthritis of the right hip. Old healed right femoral neck fracture and old pin shadows.   Electronically Signed   By: Paulina Fusi M.D.   On: 08/27/2014 01:34   Dg Shoulder Left  08/27/2014   CLINICAL DATA:  Left shoulder pain status post fall.  EXAM: LEFT SHOULDER - 2+ VIEW  COMPARISON:  12/22/2011  FINDINGS: Glenohumeral joint is located. No displaced fracture. Visualized portion of the left upper lung appears clear.  IMPRESSION: No acute or aggressive osseous finding of the left shoulder.   Electronically Signed   By: Jearld Lesch M.D.   On: 08/27/2014 01:35   Dg Knee Complete 4 Views  Left  08/27/2014   CLINICAL DATA:  Bilateral knee pain status post fall.  EXAM: LEFT KNEE - COMPLETE 4+ VIEW  COMPARISON:  None.  FINDINGS: No displaced fracture or dislocation. No aggressive osseous lesion or degenerative change. Suboptimal lateral projection without overt joint effusion.  IMPRESSION: No acute or aggressive osseous finding of the left knee.   Electronically Signed   By: Jearld Lesch M.D.   On: 08/27/2014 01:37   Dg Knee Complete 4 Views Right  08/27/2014   CLINICAL DATA:  Larey Seat from chair.  Pain.  EXAM: RIGHT KNEE - COMPLETE 4+ VIEW  COMPARISON:  08/25/2014  FINDINGS: No effusion. No fracture dislocation. No significant degenerative change. Minimal medial compartment osteophytes. No focal lesion.  IMPRESSION: Negative.   Electronically Signed   By: Paulina Fusi M.D.   On: 08/27/2014 01:36     EKG Interpretation None      MDM   Final diagnoses:  Fall, initial encounter  Concussion, with loss of consciousness of unspecified duration, initial encounter  Musculoskeletal pain   Patient presents following a ground-level fall. Nontoxic on exam.  Complains of pain at multiple sites. X-rays obtained. Low suspicion for intracranial bleed and per [place] CT head rules, patient does not require further imaging.  Patient given pain medication. Plain films are negative. Patient ambulatory and able to tolerate by mouth. Patient reports that she was given oxycodone yesterday at an outside hospital for her chronic pain. She still has oxycodone at home and can use this for pain.  Patient encouraged to add ibuprofen to pain regimen.  After history, exam, and medical workup I feel the patient has been appropriately medically screened and is safe for discharge home. Pertinent diagnoses were discussed with the patient. Patient was given return precautions.  I personally performed the services described in this documentation, which was scribed in my presence. The recorded information has been  reviewed and is accurate.      Shon Baton, MD 08/27/14 [phone]

## 2014-08-27 MED ORDER — IBUPROFEN 600 MG PO TABS: 600.0000 mg | ORAL_TABLET | Freq: Four times a day (QID) | ORAL | Status: DC | PRN

## 2014-08-27 NOTE — ED Notes (Signed)
Patient's family member upset that patient has not been over to CT or given any pain medication at this time.

## 2014-08-27 NOTE — ED Notes (Signed)
MD aware of patient's pain. Patient resting with eyes closed at this time.

## 2014-08-27 NOTE — ED Notes (Signed)
Patient ambulated to bathroom and then ambulated back to room 15.

## 2014-08-27 NOTE — ED Notes (Signed)
Patient ambulated to bathroom.

## 2014-08-27 NOTE — Discharge Instructions (Signed)
Concussion A concussion, or closed-head injury, is a brain injury caused by a direct blow to the head or by a quick and sudden movement (jolt) of the head or neck. Concussions are usually not life-threatening. Even so, the effects of a concussion can be serious. If you have had a concussion before, you are more likely to experience concussion-like symptoms after a direct blow to the head.  CAUSES  Direct blow to the head, such as from running into another player during a soccer game, being hit in a fight, or hitting your head on a hard surface.  A jolt of the head or neck that causes the brain to move back and forth inside the skull, such as in a car crash. SIGNS AND SYMPTOMS The signs of a concussion can be hard to notice. Early on, they may be missed by you, family members, and health care providers. You may look fine but act or feel differently. Symptoms are usually temporary, but they may last for days, weeks, or even longer. Some symptoms may appear right away while others may not show up for hours or days. Every head injury is different. Symptoms include:  Mild to moderate headaches that will not go away.  A feeling of pressure inside your head.  Having more trouble than usual:  Learning or remembering things you have heard.  Answering questions.  Paying attention or concentrating.  Organizing daily tasks.  Making decisions and solving problems.  Slowness in thinking, acting or reacting, speaking, or reading.  Getting lost or being easily confused.  Feeling tired all the time or lacking energy (fatigued).  Feeling drowsy.  Sleep disturbances.  Sleeping more than usual.  Sleeping less than usual.  Trouble falling asleep.  Trouble sleeping (insomnia).  Loss of balance or feeling lightheaded or dizzy.  Nausea or vomiting.  Numbness or tingling.  Increased sensitivity to:  Sounds.  Lights.  Distractions.  Vision problems or eyes that tire  easily.  Diminished sense of taste or smell.  Ringing in the ears.  Mood changes such as feeling sad or anxious.  Becoming easily irritated or angry for little or no reason.  Lack of motivation.  Seeing or hearing things other people do not see or hear (hallucinations). DIAGNOSIS Your health care provider can usually diagnose a concussion based on a description of your injury and symptoms. He or she will ask whether you passed out (lost consciousness) and whether you are having trouble remembering events that happened right before and during your injury. Your evaluation might include:  A brain scan to look for signs of injury to the brain. Even if the test shows no injury, you may still have a concussion.  Blood tests to be sure other problems are not present. TREATMENT  Concussions are usually treated in an emergency department, in urgent care, or at a clinic. You may need to stay in the hospital overnight for further treatment.  Tell your health care provider if you are taking any medicines, including prescription medicines, over-the-counter medicines, and natural remedies. Some medicines, such as blood thinners (anticoagulants) and aspirin, may increase the chance of complications. Also tell your health care provider whether you have had alcohol or are taking illegal drugs. This information may affect treatment.  Your health care provider will send you home with important instructions to follow.  How fast you will recover from a concussion depends on many factors. These factors include how severe your concussion is, what part of your brain was injured, your  age, and how healthy you were before the concussion. °· Most people with mild injuries recover fully. Recovery can take time. In general, recovery is slower in older persons. Also, persons who have had a concussion in the past or have other medical problems may find that it takes longer to recover from their current injury. °HOME  CARE INSTRUCTIONS °General Instructions °· Carefully follow the directions your health care provider gave you. °· Only take over-the-counter or prescription medicines for pain, discomfort, or fever as directed by your health care provider. °· Take only those medicines that your health care provider has approved. °· Do not drink alcohol until your health care provider says you are well enough to do so. Alcohol and certain other drugs may slow your recovery and can put you at risk of further injury. °· If it is harder than usual to remember things, write them down. °· If you are easily distracted, try to do one thing at a time. For example, do not try to watch TV while fixing dinner. °· Talk with family members or close friends when making important decisions. °· Keep all follow-up appointments. Repeated evaluation of your symptoms is recommended for your recovery. °· Watch your symptoms and tell others to do the same. Complications sometimes occur after a concussion. Older adults with a brain injury may have a higher risk of serious complications, such as a blood clot on the brain. °· Tell your teachers, school nurse, school counselor, coach, athletic trainer, or work manager about your injury, symptoms, and restrictions. Tell them about what you can or cannot do. They should watch for: °¨ Increased problems with attention or concentration. °¨ Increased difficulty remembering or learning new information. °¨ Increased time needed to complete tasks or assignments. °¨ Increased irritability or decreased ability to cope with stress. °¨ Increased symptoms. °· Rest. Rest helps the brain to heal. Make sure you: °¨ Get plenty of sleep at night. Avoid staying up late at night. °¨ Keep the same bedtime hours on weekends and weekdays. °¨ Rest during the day. Take daytime naps or rest breaks when you feel tired. °· Limit activities that require a lot of thought or concentration. These include: °¨ Doing homework or job-related  work. °¨ Watching TV. °¨ Working on the computer. °· Avoid any situation where there is potential for another head injury (football, hockey, soccer, basketball, martial arts, downhill snow sports and horseback riding). Your condition will get worse every time you experience a concussion. You should avoid these activities until you are evaluated by the appropriate follow-up health care providers. °Returning To Your Regular Activities °You will need to return to your normal activities slowly, not all at once. You must give your body and brain enough time for recovery. °· Do not return to sports or other athletic activities until your health care provider tells you it is safe to do so. °· Ask your health care provider when you can drive, ride a bicycle, or operate heavy machinery. Your ability to react may be slower after a brain injury. Never do these activities if you are dizzy. °· Ask your health care provider about when you can return to work or school. °Preventing Another Concussion °It is very important to avoid another brain injury, especially before you have recovered. In rare cases, another injury can lead to permanent brain damage, brain swelling, or death. The risk of this is greatest during the first 7-10 days after a head injury. Avoid injuries by: °· Wearing a seat   belt when riding in a car.  Drinking alcohol only in moderation.  Wearing a helmet when biking, skiing, skateboarding, skating, or doing similar activities.  Avoiding activities that could lead to a second concussion, such as contact or recreational sports, until your health care provider says it is okay.  Taking safety measures in your home.  Remove clutter and tripping hazards from floors and stairways.  Use grab bars in bathrooms and handrails by stairs.  Place non-slip mats on floors and in bathtubs.  Improve lighting in dim areas. SEEK MEDICAL CARE IF:  You have increased problems paying attention or  concentrating.  You have increased difficulty remembering or learning new information.  You need more time to complete tasks or assignments than before.  You have increased irritability or decreased ability to cope with stress.  You have more symptoms than before. Seek medical care if you have any of the following symptoms for more than 2 weeks after your injury:  Lasting (chronic) headaches.  Dizziness or balance problems.  Nausea.  Vision problems.  Increased sensitivity to noise or light.  Depression or mood swings.  Anxiety or irritability.  Memory problems.  Difficulty concentrating or paying attention.  Sleep problems.  Feeling tired all the time. SEEK IMMEDIATE MEDICAL CARE IF:  You have severe or worsening headaches. These may be a sign of a blood clot in the brain.  You have weakness (even if only in one hand, leg, or part of the face).  You have numbness.  You have decreased coordination.  You vomit repeatedly.  You have increased sleepiness.  One pupil is larger than the other.  You have convulsions.  You have slurred speech.  You have increased confusion. This may be a sign of a blood clot in the brain.  You have increased restlessness, agitation, or irritability.  You are unable to recognize people or places.  You have neck pain.  It is difficult to wake you up.  You have unusual behavior changes.  You lose consciousness. MAKE SURE YOU:  Understand these instructions.  Will watch your condition.  Will get help right away if you are not doing well or get worse. Document Released: 07/16/2003 Document Revised: 04/30/2013 Document Reviewed: 11/15/2012 [facility] Patient Information 2015 [place], [place]. This information is not intended to replace advice given to you by your health care provider. Make sure you discuss any questions you have with your health care provider. Musculoskeletal Pain Musculoskeletal pain is muscle and boney  aches and pains. These pains can occur in any part of the body. Your caregiver may treat you without knowing the cause of the pain. They may treat you if blood or urine tests, X-rays, and other tests were normal.  CAUSES There is often not a definite cause or reason for these pains. These pains may be caused by a type of germ (virus). The discomfort may also come from overuse. Overuse includes working out too hard when your body is not fit. Boney aches also come from weather changes. Bone is sensitive to atmospheric pressure changes. HOME CARE INSTRUCTIONS   Ask when your test results will be ready. Make sure you get your test results.  Only take over-the-counter or prescription medicines for pain, discomfort, or fever as directed by your caregiver. If you were given medications for your condition, do not drive, operate machinery or power tools, or sign legal documents for 24 hours. Do not drink alcohol. Do not take sleeping pills or other medications that may interfere with  treatment.  Continue all activities unless the activities cause more pain. When the pain lessens, slowly resume normal activities. Gradually increase the intensity and duration of the activities or exercise.  During periods of severe pain, bed rest may be helpful. Lay or sit in any position that is comfortable.  Putting ice on the injured area.  Put ice in a bag.  Place a towel between your skin and the bag.  Leave the ice on for 15 to 20 minutes, 3 to 4 times a day.  Follow up with your caregiver for continued problems and no reason can be found for the pain. If the pain becomes worse or does not go away, it may be necessary to repeat tests or do additional testing. Your caregiver may need to look further for a possible cause. SEEK IMMEDIATE MEDICAL CARE IF:  You have pain that is getting worse and is not relieved by medications.  You develop chest pain that is associated with shortness or breath, sweating, feeling  sick to your stomach (nauseous), or throw up (vomit).  Your pain becomes localized to the abdomen.  You develop any new symptoms that seem different or that concern you. MAKE SURE YOU:   Understand these instructions.  Will watch your condition.  Will get help right away if you are not doing well or get worse. Document Released: 04/25/2005 Document Revised: 07/18/2011 Document Reviewed: 12/28/2012 [facility] Patient Information 2015 [place]. This information is not intended to replace advice given to you by your health care provider. Make sure you discuss any questions you have with your health care provider.

## 2014-08-28 ENCOUNTER — Encounter (HOSPITAL_COMMUNITY): Payer: Self-pay | Admitting: Cardiology

## 2014-08-28 ENCOUNTER — Emergency Department (HOSPITAL_COMMUNITY): Payer: Medicaid Other

## 2014-08-28 ENCOUNTER — Emergency Department (HOSPITAL_COMMUNITY)
Admission: EM | Admit: 2014-08-28 | Discharge: 2014-08-28 | Disposition: A | Discharge status: Home / Self Care | Payer: Medicaid Other | Attending: Emergency Medicine | Admitting: Emergency Medicine

## 2014-08-28 DIAGNOSIS — Z880 Allergy status to penicillin: Secondary | ICD-10-CM | POA: Insufficient documentation

## 2014-08-28 DIAGNOSIS — Z91040 Latex allergy status: Secondary | ICD-10-CM | POA: Insufficient documentation

## 2014-08-28 DIAGNOSIS — W1839XD Other fall on same level, subsequent encounter: Secondary | ICD-10-CM | POA: Insufficient documentation

## 2014-08-28 DIAGNOSIS — S060X1D Concussion with loss of consciousness of 30 minutes or less, subsequent encounter: Secondary | ICD-10-CM

## 2014-08-28 DIAGNOSIS — Z8742 Personal history of other diseases of the female genital tract: Secondary | ICD-10-CM | POA: Insufficient documentation

## 2014-08-28 DIAGNOSIS — Z720 Tobacco use: Secondary | ICD-10-CM | POA: Insufficient documentation

## 2014-08-28 LAB — URINALYSIS, ROUTINE W REFLEX MICROSCOPIC
Bilirubin Urine: NEGATIVE
Glucose, UA: NEGATIVE mg/dL
Ketones, ur: NEGATIVE mg/dL
LEUKOCYTES UA: NEGATIVE
NITRITE: NEGATIVE
PH: 5.5 (ref 5.0–8.0)
Protein, ur: NEGATIVE mg/dL
Specific Gravity, Urine: >1.03 — ABNORMAL HIGH (ref 1.005–1.030)
Urobilinogen, UA: 0.2 mg/dL (ref 0.0–1.0)

## 2014-08-28 LAB — CBC WITH DIFFERENTIAL/PLATELET
BASOS PCT: 0 % (ref 0–1)
Basophils Absolute: 0 10*3/uL (ref 0.0–0.1)
EOS PCT: 2 % (ref 0–5)
Eosinophils Absolute: 0.2 10*3/uL (ref 0.0–0.7)
HEMATOCRIT: 32.7 % — AB (ref 36.0–46.0)
HEMOGLOBIN: 10.4 g/dL — AB (ref 12.0–15.0)
Lymphocytes Relative: 32 % (ref 12–46)
Lymphs Abs: 2.4 10*3/uL (ref 0.7–4.0)
MCH: 30.1 pg (ref 26.0–34.0)
MCHC: 31.8 g/dL (ref 30.0–36.0)
MCV: 94.5 fL (ref 78.0–100.0)
MONO ABS: 0.6 10*3/uL (ref 0.1–1.0)
MONOS PCT: 8 % (ref 3–12)
NEUTROS ABS: 4.3 10*3/uL (ref 1.7–7.7)
Neutrophils Relative %: 58 % (ref 43–77)
Platelets: 277 10*3/uL (ref 150–400)
RBC: 3.46 MIL/uL — ABNORMAL LOW (ref 3.87–5.11)
RDW: 13.6 % (ref 11.5–15.5)
WBC: 7.4 10*3/uL (ref 4.0–10.5)

## 2014-08-28 LAB — COMPREHENSIVE METABOLIC PANEL
ALT: 15 U/L (ref 0–35)
ANION GAP: 5 (ref 5–15)
AST: 21 U/L (ref 0–37)
Albumin: 3.6 g/dL (ref 3.5–5.2)
Alkaline Phosphatase: 69 U/L (ref 39–117)
BUN: 21 mg/dL (ref 6–23)
CALCIUM: 8.9 mg/dL (ref 8.4–10.5)
CO2: 27 mmol/L (ref 19–32)
Chloride: 105 mmol/L (ref 96–112)
Creatinine, Ser: 0.6 mg/dL (ref 0.50–1.10)
GFR calc non Af Amer: >90 mL/min (ref >90)
Glucose, Bld: 94 mg/dL (ref 70–99)
Potassium: 4.1 mmol/L (ref 3.5–5.1)
SODIUM: 137 mmol/L (ref 135–145)
TOTAL PROTEIN: 6.4 g/dL (ref 6.0–8.3)
Total Bilirubin: 0.2 mg/dL — ABNORMAL LOW (ref 0.3–1.2)

## 2014-08-28 LAB — URINE MICROSCOPIC-ADD ON

## 2014-08-28 MED ORDER — KETOROLAC TROMETHAMINE 30 MG/ML IJ SOLN
30.0000 mg | Freq: Once | INTRAMUSCULAR | Status: AC
  Administered 2014-08-28: 30 mg via INTRAVENOUS
  Filled 2014-08-28: qty 1

## 2014-08-28 MED ORDER — HYDROMORPHONE HCL 1 MG/ML IJ SOLN
1.0000 mg | Freq: Once | INTRAMUSCULAR | Status: AC
  Administered 2014-08-28: 1 mg via INTRAVENOUS
  Filled 2014-08-28: qty 1

## 2014-08-28 MED ORDER — PROCHLORPERAZINE EDISYLATE 5 MG/ML IJ SOLN
10.0000 mg | Freq: Once | INTRAMUSCULAR | Status: AC
  Administered 2014-08-28: 10 mg via INTRAVENOUS
  Filled 2014-08-28: qty 2

## 2014-08-28 MED ORDER — SODIUM CHLORIDE 0.9 % IV BOLUS (SEPSIS)
1000.0000 mL | Freq: Once | INTRAVENOUS | Status: AC
  Administered 2014-08-28: 1000 mL via INTRAVENOUS

## 2014-08-28 MED ORDER — ONDANSETRON HCL 4 MG/2ML IJ SOLN
4.0000 mg | Freq: Once | INTRAMUSCULAR | Status: AC
  Administered 2014-08-28: 4 mg via INTRAVENOUS
  Filled 2014-08-28: qty 2

## 2014-08-28 NOTE — ED Notes (Signed)
MD at bedside. 

## 2014-08-28 NOTE — ED Notes (Signed)
Patient rates headache as 8/10 prior to admininstration of medication.

## 2014-08-28 NOTE — ED Notes (Signed)
Pt placed on 2 L 02 for sats 89%.

## 2014-08-28 NOTE — ED Notes (Signed)
Patient still unable to urinate at this time 

## 2014-08-28 NOTE — ED Notes (Signed)
Attempted in/out cath.  Met with resistance - patient stated that she has a prolapsed bladder. Unable to get cath tube in.    Patient placed on bedpan.

## 2014-08-28 NOTE — ED Notes (Signed)
Pt made aware we need a urine specimen. 

## 2014-08-28 NOTE — ED Notes (Signed)
Patient requesting for something else for pain; states Toradol did not help.

## 2014-08-28 NOTE — Discharge Instructions (Signed)
Follow up with dr. Gerilyn Pilgrimdoonquah if not improving.

## 2014-08-28 NOTE — ED Notes (Signed)
Patient requesting pain and nausea medication.  Dr. Estell HarpinZammit aware.

## 2014-08-28 NOTE — ED Provider Notes (Signed)
CSN: [id number]     Arrival date & time 08/28/14  1507 History   First MD Initiated Contact with Patient 08/28/14 1558     Chief Complaint  Patient presents with  . Headache     (Consider location/radiation/quality/duration/timing/severity/associated sxs/prior Treatment) Patient is a 44 y.o. female presenting with headaches. The history is provided by the patient (the pt was seen here 2 days ago with a head injury with loc.  pt has a headache now and weakness).  Headache Pain location:  Generalized Quality:  Dull Radiates to:  Does not radiate Severity currently:  6/10 Severity at highest:  8/10 Onset quality:  Gradual Timing:  Constant Progression:  Waxing and waning Associated symptoms: no abdominal pain, no back pain, no congestion, no cough, no diarrhea, no fatigue, no seizures and no sinus pressure     Past Medical History  Diagnosis Date  . Polycystic ovarian disease 1993  . Endometriosis 1996  . Endometritis 1990    fever 105 post delivery   Past Surgical History  Procedure Laterality Date  . Hip arthroplasty    . Abdominal hysterectomy    . Mass off appendix    . Hysterrectomy  1996  . 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 Right oophorectomy    . Left oophorectomy  2007  . Wrist surgery  2007 and 2003    both tendon release  . Laparotomy      multiple for cysts   History reviewed. No pertinent family history. History  Substance Use Topics  . Smoking status: Current Some Day Smoker -- 0.50 packs/day  . Smokeless tobacco: Never Used  . Alcohol Use: No   OB History    Gravida Para Term Preterm AB TAB SAB Ectopic Multiple Living   Review of Systems  Constitutional: Negative for appetite change and fatigue.  HENT: Negative for congestion, ear discharge and sinus pressure.   Eyes: Negative for discharge.  Respiratory: Negative for cough.   Cardiovascular: Negative for chest pain.   Gastrointestinal: Negative for abdominal pain and diarrhea.  Genitourinary: Negative for frequency and hematuria.  Musculoskeletal: Negative for back pain.  Skin: Negative for rash.  Neurological: Positive for headaches. Negative for seizures.  Psychiatric/Behavioral: Negative for hallucinations.      Allergies  Latex; Amoxicillin; Darvocet; Eggs or egg-derived products; Gabapentin; Reglan; and Sulfa antibiotics  Home Medications   Prior to Admission medications   Medication Sig Start Date End Date Taking? Authorizing Provider  acetaminophen (TYLENOL) 500 MG tablet Take 500 mg by mouth every 6 (six) hours as needed for pain.   Yes Historical Provider, MD  bisacodyl (DULCOLAX) 5 MG EC tablet Take 5 mg by mouth daily as needed for moderate constipation.   Yes Historical Provider, MD  clonazePAM (KLONOPIN) 0.5 MG tablet Take 0.5 mg by mouth 2 (two) times daily as needed for anxiety.   Yes Historical Provider, MD  doxepin (SINEQUAN) 25 MG capsule Take 25 mg by mouth at bedtime.   Yes Historical Provider, MD  FLUoxetine (PROZAC) 40 MG capsule Take 80 mg by mouth daily.   Yes Historical Provider, MD  lamoTRIgine (LAMICTAL) 200 MG tablet Take 200 mg by mouth at bedtime.   Yes Historical Provider, MD  oxycodone (OXY-IR) 5 MG capsule Take 5-10 mg by mouth every 8 (eight) hours as needed for pain.   Yes Historical Provider,  MD  QUEtiapine (SEROQUEL) 50 MG tablet Take 50-150 mg by mouth at bedtime. Pt takes 50 mg daily with an additional 150 mg to relive migraine when needed   Yes Historical Provider, MD  ibuprofen (ADVIL,MOTRIN) 600 MG tablet Take 1 tablet (600 mg total) by mouth every 6 (six) hours as needed. 08/27/14   Shon Batonourtney F Horton, MD  tizanidine (ZANAFLEX) 2 MG capsule Take 1 capsule (2 mg total) by mouth 3 (three) times daily as needed for muscle spasms. 11/14/12   Stephanie Couphristopher M Street, MD  traMADol (ULTRAM) 50 MG tablet Take 1 tablet (50 mg total) by mouth every 6 (six) hours as needed for  pain. Patient not taking: Reported on 08/26/2014 11/14/12   Stephanie Couphristopher M Street, MD   BP 111/73 mmHg  Pulse 78  Temp(Src) 97.7 F (36.5 C) (Oral)  Resp 20  Ht 5\' 7"  (1.702 m)  Wt 251 lb (113.853 kg)  BMI 39.30 kg/m2  SpO2 94% Physical Exam  Constitutional: She is oriented to person, place, and time. She appears well-developed.  HENT:  Head: Normocephalic.  Eyes: Conjunctivae and EOM are normal. No scleral icterus.  Neck: Neck supple. No thyromegaly present.  Cardiovascular: Normal rate and regular rhythm.  Exam reveals no gallop and no friction rub.   No murmur heard. Pulmonary/Chest: No stridor. She has no wheezes. She has no rales. She exhibits no tenderness.  Abdominal: She exhibits no distension. There is no tenderness. There is no rebound.  Musculoskeletal: Normal range of motion. She exhibits no edema.  Lymphadenopathy:    She has no cervical adenopathy.  Neurological: She is oriented to person, place, and time. She exhibits normal muscle tone. Coordination normal.  Skin: No rash noted. No erythema.  Psychiatric: She has a normal mood and affect. Her behavior is normal.    ED Course  Procedures (including critical care time) Labs Review Labs Reviewed  CBC WITH DIFFERENTIAL/PLATELET - Abnormal; Notable for the following:    RBC 3.46 (*)    Hemoglobin 10.4 (*)    HCT 32.7 (*)    All other components within normal limits  COMPREHENSIVE METABOLIC PANEL - Abnormal; Notable for the following:    Total Bilirubin 0.2 (*)    All other components within normal limits  URINALYSIS, ROUTINE W REFLEX MICROSCOPIC - Abnormal; Notable for the following:    Specific Gravity, Urine >1.030 (*)    Hgb urine dipstick LARGE (*)    All other components within normal limits  URINE MICROSCOPIC-ADD ON - Abnormal; Notable for the following:    Squamous Epithelial / LPF MANY (*)    Bacteria, UA FEW (*)    All other components within normal limits    Imaging Review Dg Chest 2  View  08/27/2014   CLINICAL DATA:  Stat in chair, falling over. Left shoulder and right chest pain.  EXAM: CHEST  2 VIEW  COMPARISON:  11/14/2012  FINDINGS: Heart size is at the upper limits of normal. Mediastinal shadows are normal. The lungs are clear. Poor inspiration. No effusion. No bony abnormality.  IMPRESSION: No active cardiopulmonary disease.   Electronically Signed   By: Paulina FusiMark  Shogry M.D.   On: 08/27/2014 01:22   Dg Cervical Spine Complete  08/27/2014   CLINICAL DATA:  Facet down on beach chair tonight, falling over. Right-sided neck pain.  EXAM: CERVICAL SPINE  4+ VIEWS  COMPARISON:  None.  FINDINGS: Alignment is normal. No fracture. No soft tissue swelling. No significant degenerative changes.  IMPRESSION: Negative cervical spine  radiographs.   Electronically Signed   By: Paulina Fusi M.D.   On: 08/27/2014 01:20   Dg Pelvis 1-2 Views  08/27/2014   CLINICAL DATA:  Sat in chair, falling over. History of old hip fracture on the right. Right-sided pelvic and hip pain.  EXAM: PELVIS - 1-2 VIEW  COMPARISON:  None.  FINDINGS: No pelvic fracture. There is arthritis of the right hip. There is an old healed fracture of the right femoral neck. Shadows from previous Knowles pins are noted. No evidence of pelvic fracture acutely.  IMPRESSION: No acute finding. Osteoarthritis of the right hip. Old healed right femoral neck fracture and old pin shadows.   Electronically Signed   By: Paulina Fusi M.D.   On: 08/27/2014 01:34   Ct Head Wo Contrast  08/28/2014   CLINICAL DATA:  Fall 2 days ago with persistent headache.  Lethargy.  EXAM: CT HEAD WITHOUT CONTRAST  TECHNIQUE: Contiguous axial images were obtained from the base of the skull through the vertex without intravenous contrast.  COMPARISON:  None.  FINDINGS: The gray-white differentiation is maintained. No CT evidence of acute large territory infarct. No intraparenchymal or extra-axial mass or hemorrhage. Normal size and configuration of the ventricles  and basilar cisterns. No midline shift. Limited visualization of the paranasal sinuses and mastoid air cells is normal. No air-fluid levels. Regional soft tissues appear normal. No displaced calvarial fracture.  IMPRESSION: Negative noncontrast head CT.   Electronically Signed   By: Simonne Come M.D.   On: 08/28/2014 16:48   Dg Shoulder Left  08/27/2014   CLINICAL DATA:  Left shoulder pain status post fall.  EXAM: LEFT SHOULDER - 2+ VIEW  COMPARISON:  12/22/2011  FINDINGS: Glenohumeral joint is located. No displaced fracture. Visualized portion of the left upper lung appears clear.  IMPRESSION: No acute or aggressive osseous finding of the left shoulder.   Electronically Signed   By: Jearld Lesch M.D.   On: 08/27/2014 01:35   Dg Knee Complete 4 Views Left  08/27/2014   CLINICAL DATA:  Bilateral knee pain status post fall.  EXAM: LEFT KNEE - COMPLETE 4+ VIEW  COMPARISON:  None.  FINDINGS: No displaced fracture or dislocation. No aggressive osseous lesion or degenerative change. Suboptimal lateral projection without overt joint effusion.  IMPRESSION: No acute or aggressive osseous finding of the left knee.   Electronically Signed   By: Jearld Lesch M.D.   On: 08/27/2014 01:37   Dg Knee Complete 4 Views Right  08/27/2014   CLINICAL DATA:  Larey Seat from chair.  Pain.  EXAM: RIGHT KNEE - COMPLETE 4+ VIEW  COMPARISON:  08/25/2014  FINDINGS: No effusion. No fracture dislocation. No significant degenerative change. Minimal medial compartment osteophytes. No focal lesion.  IMPRESSION: Negative.   Electronically Signed   By: Paulina Fusi M.D.   On: 08/27/2014 01:36     EKG Interpretation   Date/Time:  Thursday August 28 2014 15:39:55 EDT Ventricular Rate:  90 PR Interval:  157 QRS Duration: 92 QT Interval:  393 QTC Calculation: 481 R Axis:   33 Text Interpretation:  Sinus rhythm Borderline T abnormalities, anterior  leads No significant change was found Confirmed by Manus Gunning  MD, STEPHEN  [phone]) on  08/28/2014 3:44:25 PM      MDM   Final diagnoses:  Concussion, without loss of consciousness, subsequent encounter    Concussion headache.  Pt referred to neurology    Bethann Berkshire, MD 08/28/14 2020

## 2014-08-28 NOTE — ED Notes (Signed)
Upon trying to obtain urine specimen by in and out cath, nurse met resistance when trying to enter the bladder. Pt states she has a prolapsed bladder. Pt began to bleed when resistance was met.   MD made aware and requests to give pt. Second liter of fluid.     

## 2014-08-28 NOTE — ED Notes (Signed)
Seen here 2 days ago after a fall.  Headache since fall.  Lethargic and feels like she is unsteady since fall also.

## 2014-09-01 ENCOUNTER — Emergency Department (HOSPITAL_COMMUNITY): Payer: Medicaid Other

## 2014-09-01 ENCOUNTER — Emergency Department (HOSPITAL_COMMUNITY)
Admission: EM | Admit: 2014-09-01 | Discharge: 2014-09-02 | Discharge status: Against Medical Advice | Payer: Medicaid Other | Attending: Emergency Medicine | Admitting: Emergency Medicine

## 2014-09-01 ENCOUNTER — Encounter (HOSPITAL_COMMUNITY): Payer: Self-pay | Admitting: Emergency Medicine

## 2014-09-01 DIAGNOSIS — R519 Headache, unspecified: Secondary | ICD-10-CM

## 2014-09-01 DIAGNOSIS — R51 Headache: Secondary | ICD-10-CM

## 2014-09-01 DIAGNOSIS — Z880 Allergy status to penicillin: Secondary | ICD-10-CM | POA: Insufficient documentation

## 2014-09-01 DIAGNOSIS — Z8639 Personal history of other endocrine, nutritional and metabolic disease: Secondary | ICD-10-CM | POA: Insufficient documentation

## 2014-09-01 DIAGNOSIS — S060X1S Concussion with loss of consciousness of 30 minutes or less, sequela: Secondary | ICD-10-CM

## 2014-09-01 DIAGNOSIS — R269 Unspecified abnormalities of gait and mobility: Secondary | ICD-10-CM | POA: Insufficient documentation

## 2014-09-01 DIAGNOSIS — Z91040 Latex allergy status: Secondary | ICD-10-CM | POA: Insufficient documentation

## 2014-09-01 DIAGNOSIS — Z720 Tobacco use: Secondary | ICD-10-CM | POA: Insufficient documentation

## 2014-09-01 DIAGNOSIS — W1839XS Other fall on same level, sequela: Secondary | ICD-10-CM | POA: Insufficient documentation

## 2014-09-01 DIAGNOSIS — Z8742 Personal history of other diseases of the female genital tract: Secondary | ICD-10-CM | POA: Insufficient documentation

## 2014-09-01 MED ORDER — PROMETHAZINE HCL 25 MG/ML IJ SOLN
25.0000 mg | Freq: Once | INTRAMUSCULAR | Status: AC
  Administered 2014-09-01: 25 mg via INTRAMUSCULAR
  Filled 2014-09-01: qty 1

## 2014-09-01 MED ORDER — KETOROLAC TROMETHAMINE 60 MG/2ML IM SOLN
60.0000 mg | Freq: Once | INTRAMUSCULAR | Status: AC
  Administered 2014-09-01: 60 mg via INTRAMUSCULAR
  Filled 2014-09-01: qty 2

## 2014-09-01 NOTE — ED Provider Notes (Signed)
CSN: [id number]     Arrival date & time 09/01/14  1700 History  This chart was scribed for Tammy CoreNathan Walaa Carel, MD by Tanda RockersMargaux Venter, ED Scribe. This patient was seen in room APA15/[facility]15 and the patient's care was started at 9:22 PM.    Chief Complaint  Patient presents with  . Head Injury   The history is provided by the patient. No language interpreter was used.     HPI Comments: Tammy KaufmanKelly L Winget is a 44 y.o. female who presents to the Emergency Department complaining of persistent, gradually worsening headache and dizziness that began 1 week ago.. Pt was seen in ED on 4/19 (1 week ago) for head injury s/p ground level fall. Pt was diagnosed with concussion and discharged home with instructions to use Oxycodone prescription for chronic pain. Pt was seen again on 4/21 (4 days ago) for constant headache since falling. She had CT Head done at that time which was normal. Pt was referred to Neurology at that time and discharged home. She reports that her headache has worsened since being seen 4 days ago.  She admits to hx of headaches but mentions that the headache is localized to the frontal and occiput regions which is irregular. Pt states she is walking like she is "drunk." She also complains of nausea. She denies photophobia, vomiting, weakness or numbness in extremities, or any other symptoms.   Past Medical History  Diagnosis Date  . Polycystic ovarian disease 1993  . Endometriosis 1996  . Endometritis 1990    fever 105 post delivery   Past Surgical History  Procedure Laterality Date  . Hip arthroplasty    . Abdominal hysterectomy    . Mass off appendix    . Hysterrectomy  1996  . 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 Right oophorectomy    . Left oophorectomy  2007  . Wrist surgery  2007 and 2003    both tendon release  . Laparotomy      multiple for cysts   Family History  Problem Relation Age of Onset  . Stroke Mother   . Cancer Father     History  Substance Use Topics  . Smoking status: Current Some Day Smoker -- 0.50 packs/day  . Smokeless tobacco: Never Used  . Alcohol Use: No   OB History    Gravida Para Term Preterm AB TAB SAB Ectopic Multiple Living   3 2   1  1   2      Review of Systems  Constitutional: Negative for fever.  HENT: Negative for rhinorrhea.   Eyes: Negative for photophobia and visual disturbance.  Respiratory: Negative for cough and shortness of breath.   Cardiovascular: Negative for chest pain.  Gastrointestinal: Positive for nausea. Negative for vomiting, abdominal pain and diarrhea.  Musculoskeletal: Positive for gait problem.  Neurological: Positive for dizziness and headaches. Negative for weakness and numbness.  Psychiatric/Behavioral: Negative for confusion.      Allergies  Latex; Amoxicillin; Darvocet; Eggs or egg-derived products; Gabapentin; Reglan; and Sulfa antibiotics  Home Medications   Prior to Admission medications   Medication Sig Start Date End Date Taking? Authorizing Provider  acetaminophen (TYLENOL) 500 MG tablet Take 500 mg by mouth every 6 (six) hours as needed for pain.   Yes Historical Provider, MD  bisacodyl (DULCOLAX) 5 MG EC tablet Take 5 mg by mouth daily as needed for moderate constipation.   Yes Historical Provider, MD  clonazePAM (  KLONOPIN) 0.5 MG tablet Take 0.5 mg by mouth 2 (two) times daily as needed for anxiety.   Yes Historical Provider, MD  doxepin (SINEQUAN) 25 MG capsule Take 25 mg by mouth at bedtime.   Yes Historical Provider, MD  FLUoxetine (PROZAC) 40 MG capsule Take 80 mg by mouth daily.   Yes Historical Provider, MD  lamoTRIgine (LAMICTAL) 200 MG tablet Take 200 mg by mouth at bedtime.   Yes Historical Provider, MD  oxycodone (OXY-IR) 5 MG capsule Take 5-10 mg by mouth every 8 (eight) hours as needed for pain.   Yes Historical Provider, MD  QUEtiapine (SEROQUEL) 50 MG tablet Take 50-150 mg by mouth at bedtime. Pt takes 50 mg daily with an  additional 150 mg to relive migraine when needed   Yes Historical Provider, MD  tizanidine (ZANAFLEX) 2 MG capsule Take 1 capsule (2 mg total) by mouth 3 (three) times daily as needed for muscle spasms. Patient not taking: Reported on 09/01/2014 11/14/12   [name], MD  traMADol (ULTRAM) 50 MG tablet Take 1 tablet (50 mg total) by mouth every 6 (six) hours as needed for pain. Patient not taking: Reported on 08/26/2014 11/14/12   [name], MD   Triage Vitals: BP 117/72 mmHg  Pulse 66  Temp(Src) 98.7 F (37.1 C) (Oral)  Resp 16  Ht  (1.702 m)  Wt 250 lb (113.399 kg)  BMI 39.15 kg/m2  SpO2 97%   Physical Exam  Constitutional: She is oriented to person, place, and time. She appears well-developed and well-nourished. No distress.  HENT:  Head: Normocephalic and atraumatic.  Eyes: Conjunctivae and EOM are normal. Right eye exhibits no nystagmus. Left eye exhibits no nystagmus.  Mildly dilated pupils.  Neck: Neck supple. No tracheal deviation present.  Cardiovascular: Normal rate, regular rhythm and normal heart sounds.   Pulmonary/Chest: Effort normal and breath sounds normal. No respiratory distress.  Abdominal: Soft. There is no tenderness.  Musculoskeletal: Normal range of motion.  Neurological: She is alert and oriented to person, place, and time. She displays a negative Romberg sign.  Finger to nose intact.  Moving all extremities.   Skin: Skin is warm and dry.  Psychiatric: She has a normal mood and affect. Her behavior is normal.  Nursing note and vitals reviewed.   ED Course  Procedures (including critical care time)  DIAGNOSTIC STUDIES: Oxygen Saturation is 97% on RA, normal by my interpretation.    COORDINATION OF CARE: 9:26 PM-Discussed treatment plan which includes CT Head with pt at bedside and pt agreed to plan.   Labs Review Labs Reviewed - No data to display  Imaging Review Ct Head Wo Contrast  09/01/2014   CLINICAL DATA:  Status post  fall, with head injury. Worsening dizziness, frontal and posterior headache, and nausea. Gait problems. Initial encounter.  EXAM: CT HEAD WITHOUT CONTRAST  TECHNIQUE: Contiguous axial images were obtained from the base of the skull through the vertex without intravenous contrast.  COMPARISON:  CT of the head performed 08/28/2014  FINDINGS: There is no evidence of acute infarction, mass lesion, or intra- or extra-axial hemorrhage on CT.  The posterior fossa, including the cerebellum, brainstem and fourth ventricle, is within normal limits. The third and lateral ventricles, and basal ganglia are unremarkable in appearance. The cerebral hemispheres are symmetric in appearance, with normal gray-white differentiation. No mass effect or midline shift is seen.  There is no evidence of fracture; visualized osseous structures are unremarkable in appearance. The visualized portions  of the orbits are within normal limits. The paranasal sinuses and mastoid air cells are well-aerated. No significant soft tissue abnormalities are seen.  IMPRESSION: No evidence of traumatic intracranial injury or fracture.   Electronically Signed   By: Roanna Raider M.D.   On: 09/01/2014 22:44     EKG Interpretation None      MDM   Final diagnoses:  Concussion, with loss of consciousness of 30 minutes or less, sequela  Nonintractable headache, unspecified chronicity pattern, unspecified headache type    patient with headache and some unsteadiness. Recent possible concussion. Comes in for recheck of states the headaches have changed and gotten worse. Does have a history of chronic daily headaches. She's been managed at [facility] neurolog[facility] for this. The patient has been  downplaying the role of her chronic headaches and does not mention the management of them. There is question of drug-seeking behavior in the past. Patient will not be given narcotics for her concussion. At the time of my reexamination patient was sleeping cannot easily be  aroused. Will allow or metabolism the Phenergan patient will be discharged home. I personally performed the services described in this documentation, which was scribed in my presence. The recorded information has been reviewed and is accurate.       Tammy Core, MD 09/02/14 [phone]

## 2014-09-01 NOTE — ED Notes (Signed)
To bedside with IM meds for patient. Patient states "this would be a lot easier if you would just start an IV."   Agreed to IM meds as ordered.

## 2014-09-01 NOTE — ED Notes (Signed)
Patient transported back from CT 

## 2014-09-01 NOTE — ED Notes (Signed)
Pt reports being brought in to the ED for a head injury last Tues, Pt was told she had a concussion. Pt returns today stating she has been dizzy, had a headache and is nauseous.

## 2014-09-01 NOTE — ED Notes (Signed)
Pt called twice for room placement. No response 

## 2014-09-02 NOTE — ED Notes (Addendum)
Pt requesting additional medications for headache.  Informed pt that since it has been less than 4 hours it is not time for additional Toradol dose.  Offered Ibuprofen or Tylenol.  Pt refused, stating "that's not going to help. I'm hurting really bad."  Pt does have history of chronic headaches that per previous notes are [facility].  Pt also states that she has not made appointment to follow up with neurologist as recommended following initial treatment from fall which happened on 4/19.

## 2014-09-02 NOTE — ED Notes (Signed)
Pt states that she is going to leave.  States "nobody is doing anything for me."  Explained to pt that the CT was completed and results were normal.  Also she was medicated for pain, but at this time unable to give anymore medications.  Also reminded pt that she refused medications that were able to be given. Pt left department, refused to sign AMA forms.

## 2014-09-02 NOTE — Discharge Instructions (Signed)
Concussion  A concussion, or closed-head injury, is a brain injury caused by a direct blow to the head or by a quick and sudden movement (jolt) of the head or neck. Concussions are usually not life-threatening. Even so, the effects of a concussion can be serious. If you have had a concussion before, you are more likely to experience concussion-like symptoms after a direct blow to the head.   CAUSES  · Direct blow to the head, such as from running into another player during a soccer game, being hit in a fight, or hitting your head on a hard surface.  · A jolt of the head or neck that causes the brain to move back and forth inside the skull, such as in a car crash.  SIGNS AND SYMPTOMS  The signs of a concussion can be hard to notice. Early on, they may be missed by you, family members, and health care providers. You may look fine but act or feel differently.  Symptoms are usually temporary, but they may last for days, weeks, or even longer. Some symptoms may appear right away while others may not show up for hours or days. Every head injury is different. Symptoms include:  · Mild to moderate headaches that will not go away.  · A feeling of pressure inside your head.  · Having more trouble than usual:  ¨ Learning or remembering things you have heard.  ¨ Answering questions.  ¨ Paying attention or concentrating.  ¨ Organizing daily tasks.  ¨ Making decisions and solving problems.  · Slowness in thinking, acting or reacting, speaking, or reading.  · Getting lost or being easily confused.  · Feeling tired all the time or lacking energy (fatigued).  · Feeling drowsy.  · Sleep disturbances.  ¨ Sleeping more than usual.  ¨ Sleeping less than usual.  ¨ Trouble falling asleep.  ¨ Trouble sleeping (insomnia).  · Loss of balance or feeling lightheaded or dizzy.  · Nausea or vomiting.  · Numbness or tingling.  · Increased sensitivity to:  ¨ Sounds.  ¨ Lights.  ¨ Distractions.  · Vision problems or eyes that tire  easily.  · Diminished sense of taste or smell.  · Ringing in the ears.  · Mood changes such as feeling sad or anxious.  · Becoming easily irritated or angry for little or no reason.  · Lack of motivation.  · Seeing or hearing things other people do not see or hear (hallucinations).  DIAGNOSIS  Your health care provider can usually diagnose a concussion based on a description of your injury and symptoms. He or she will ask whether you passed out (lost consciousness) and whether you are having trouble remembering events that happened right before and during your injury.  Your evaluation might include:  · A brain scan to look for signs of injury to the brain. Even if the test shows no injury, you may still have a concussion.  · Blood tests to be sure other problems are not present.  TREATMENT  · Concussions are usually treated in an emergency department, in urgent care, or at a clinic. You may need to stay in the hospital overnight for further treatment.  · Tell your health care provider if you are taking any medicines, including prescription medicines, over-the-counter medicines, and natural remedies. Some medicines, such as blood thinners (anticoagulants) and aspirin, may increase the chance of complications. Also tell your health care provider whether you have had alcohol or are taking illegal drugs. This information   may affect treatment.  · Your health care provider will send you home with important instructions to follow.  · How fast you will recover from a concussion depends on many factors. These factors include how severe your concussion is, what part of your brain was injured, your age, and how healthy you were before the concussion.  · Most people with mild injuries recover fully. Recovery can take time. In general, recovery is slower in older persons. Also, persons who have had a concussion in the past or have other medical problems may find that it takes longer to recover from their current injury.  HOME  CARE INSTRUCTIONS  General Instructions  · Carefully follow the directions your health care provider gave you.  · Only take over-the-counter or prescription medicines for pain, discomfort, or fever as directed by your health care provider.  · Take only those medicines that your health care provider has approved.  · Do not drink alcohol until your health care provider says you are well enough to do so. Alcohol and certain other drugs may slow your recovery and can put you at risk of further injury.  · If it is harder than usual to remember things, write them down.  · If you are easily distracted, try to do one thing at a time. For example, do not try to watch TV while fixing dinner.  · Talk with family members or close friends when making important decisions.  · Keep all follow-up appointments. Repeated evaluation of your symptoms is recommended for your recovery.  · Watch your symptoms and tell others to do the same. Complications sometimes occur after a concussion. Older adults with a brain injury may have a higher risk of serious complications, such as a blood clot on the brain.  · Tell your teachers, school nurse, school counselor, coach, athletic trainer, or work manager about your injury, symptoms, and restrictions. Tell them about what you can or cannot do. They should watch for:  ¨ Increased problems with attention or concentration.  ¨ Increased difficulty remembering or learning new information.  ¨ Increased time needed to complete tasks or assignments.  ¨ Increased irritability or decreased ability to cope with stress.  ¨ Increased symptoms.  · Rest. Rest helps the brain to heal. Make sure you:  ¨ Get plenty of sleep at night. Avoid staying up late at night.  ¨ Keep the same bedtime hours on weekends and weekdays.  ¨ Rest during the day. Take daytime naps or rest breaks when you feel tired.  · Limit activities that require a lot of thought or concentration. These include:  ¨ Doing homework or job-related  work.  ¨ Watching TV.  ¨ Working on the computer.  · Avoid any situation where there is potential for another head injury (football, hockey, soccer, basketball, martial arts, downhill snow sports and horseback riding). Your condition will get worse every time you experience a concussion. You should avoid these activities until you are evaluated by the appropriate follow-up health care providers.  Returning To Your Regular Activities  You will need to return to your normal activities slowly, not all at once. You must give your body and brain enough time for recovery.  · Do not return to sports or other athletic activities until your health care provider tells you it is safe to do so.  · Ask your health care provider when you can drive, ride a bicycle, or operate heavy machinery. Your ability to react may be slower after a   brain injury. Never do these activities if you are dizzy.  · Ask your health care provider about when you can return to work or school.  Preventing Another Concussion  It is very important to avoid another brain injury, especially before you have recovered. In rare cases, another injury can lead to permanent brain damage, brain swelling, or death. The risk of this is greatest during the first 7-10 days after a head injury. Avoid injuries by:  · Wearing a seat belt when riding in a car.  · Drinking alcohol only in moderation.  · Wearing a helmet when biking, skiing, skateboarding, skating, or doing similar activities.  · Avoiding activities that could lead to a second concussion, such as contact or recreational sports, until your health care provider says it is okay.  · Taking safety measures in your home.  ¨ Remove clutter and tripping hazards from floors and stairways.  ¨ Use grab bars in bathrooms and handrails by stairs.  ¨ Place non-slip mats on floors and in bathtubs.  ¨ Improve lighting in dim areas.  SEEK MEDICAL CARE IF:  · You have increased problems paying attention or  concentrating.  · You have increased difficulty remembering or learning new information.  · You need more time to complete tasks or assignments than before.  · You have increased irritability or decreased ability to cope with stress.  · You have more symptoms than before.  Seek medical care if you have any of the following symptoms for more than 2 weeks after your injury:  · Lasting (chronic) headaches.  · Dizziness or balance problems.  · Nausea.  · Vision problems.  · Increased sensitivity to noise or light.  · Depression or mood swings.  · Anxiety or irritability.  · Memory problems.  · Difficulty concentrating or paying attention.  · Sleep problems.  · Feeling tired all the time.  SEEK IMMEDIATE MEDICAL CARE IF:  · You have severe or worsening headaches. These may be a sign of a blood clot in the brain.  · You have weakness (even if only in one hand, leg, or part of the face).  · You have numbness.  · You have decreased coordination.  · You vomit repeatedly.  · You have increased sleepiness.  · One pupil is larger than the other.  · You have convulsions.  · You have slurred speech.  · You have increased confusion. This may be a sign of a blood clot in the brain.  · You have increased restlessness, agitation, or irritability.  · You are unable to recognize people or places.  · You have neck pain.  · It is difficult to wake you up.  · You have unusual behavior changes.  · You lose consciousness.  MAKE SURE YOU:  · Understand these instructions.  · Will watch your condition.  · Will get help right away if you are not doing well or get worse.  Document Released: 07/16/2003 Document Revised: 04/30/2013 Document Reviewed: 11/15/2012  [employer]® Patient Information ©2015 [employer]. This information is not intended to replace advice given to you by your health care provider. Make sure you discuss any questions you have with your health care provider.

## 2014-09-28 ENCOUNTER — Encounter (HOSPITAL_COMMUNITY): Payer: Self-pay | Admitting: Emergency Medicine

## 2014-09-28 ENCOUNTER — Emergency Department (HOSPITAL_COMMUNITY)
Admission: EM | Admit: 2014-09-28 | Discharge: 2014-09-28 | Disposition: A | Discharge status: Home / Self Care | Payer: Medicaid Other | Attending: Emergency Medicine | Admitting: Emergency Medicine

## 2014-09-28 ENCOUNTER — Emergency Department (HOSPITAL_COMMUNITY): Payer: Medicaid Other

## 2014-09-28 DIAGNOSIS — Z91040 Latex allergy status: Secondary | ICD-10-CM | POA: Insufficient documentation

## 2014-09-28 DIAGNOSIS — S93401A Sprain of unspecified ligament of right ankle, initial encounter: Secondary | ICD-10-CM | POA: Insufficient documentation

## 2014-09-28 DIAGNOSIS — Z79899 Other long term (current) drug therapy: Secondary | ICD-10-CM | POA: Insufficient documentation

## 2014-09-28 DIAGNOSIS — Z8742 Personal history of other diseases of the female genital tract: Secondary | ICD-10-CM | POA: Insufficient documentation

## 2014-09-28 DIAGNOSIS — Y998 Other external cause status: Secondary | ICD-10-CM | POA: Insufficient documentation

## 2014-09-28 DIAGNOSIS — S93601A Unspecified sprain of right foot, initial encounter: Secondary | ICD-10-CM

## 2014-09-28 DIAGNOSIS — Z8639 Personal history of other endocrine, nutritional and metabolic disease: Secondary | ICD-10-CM | POA: Insufficient documentation

## 2014-09-28 DIAGNOSIS — Y9289 Other specified places as the place of occurrence of the external cause: Secondary | ICD-10-CM | POA: Insufficient documentation

## 2014-09-28 DIAGNOSIS — W010XXA Fall on same level from slipping, tripping and stumbling without subsequent striking against object, initial encounter: Secondary | ICD-10-CM | POA: Insufficient documentation

## 2014-09-28 DIAGNOSIS — Y9389 Activity, other specified: Secondary | ICD-10-CM | POA: Insufficient documentation

## 2014-09-28 DIAGNOSIS — Z880 Allergy status to penicillin: Secondary | ICD-10-CM | POA: Insufficient documentation

## 2014-09-28 DIAGNOSIS — Z720 Tobacco use: Secondary | ICD-10-CM | POA: Insufficient documentation

## 2014-09-28 MED ORDER — HYDROCODONE-ACETAMINOPHEN 5-325 MG PO TABS
1.0000 | ORAL_TABLET | Freq: Once | ORAL | Status: AC
Start: 2014-09-28 — End: 2014-09-28
  Administered 2014-09-28: 1 via ORAL
  Filled 2014-09-28: qty 1

## 2014-09-28 MED ORDER — TRAMADOL HCL 50 MG PO TABS: 50.0000 mg | ORAL_TABLET | Freq: Four times a day (QID) | ORAL | Status: DC | PRN

## 2014-09-28 NOTE — ED Notes (Signed)
Pt reports slipping on a rug and falling, landing on her R side. Pt c/o R foot, leg and hip pain. No LOC.

## 2014-09-28 NOTE — ED Provider Notes (Signed)
CSN: [id number]     Arrival date & time 09/28/14  1806 History   First MD Initiated Contact with Patient 09/28/14 1812     Chief Complaint  Patient presents with  . Fall     (Consider location/radiation/quality/duration/timing/severity/associated sxs/prior Treatment) Patient is a 44 y.o. female presenting with fall. The history is provided by the patient (the pt states she fell and hurt her right foot ans ankle).  Fall This is a new problem. The current episode started 12 to 24 hours ago. The problem occurs constantly. The problem has not changed since onset.Pertinent negatives include no chest pain, no abdominal pain and no headaches. Nothing aggravates the symptoms. Nothing relieves the symptoms.    Past Medical History  Diagnosis Date  . Polycystic ovarian disease 1993  . Endometriosis 1996  . Endometritis 1990    fever 105 post delivery   Past Surgical History  Procedure Laterality Date  . Hip arthroplasty    . Abdominal hysterectomy    . Mass off appendix    . Hysterrectomy  1996  . 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 Right oophorectomy    . Left oophorectomy  2007  . Wrist surgery  2007 and 2003    both tendon release  . Laparotomy      multiple for cysts   Family History  Problem Relation Age of Onset  . Stroke Mother   . Cancer Father    History  Substance Use Topics  . Smoking status: Current Some Day Smoker -- 0.50 packs/day  . Smokeless tobacco: Never Used  . Alcohol Use: No   OB History    Gravida Para Term Preterm AB TAB SAB Ectopic Multiple Living   Review of Systems  Constitutional: Negative for appetite change and fatigue.  HENT: Negative for congestion, ear discharge and sinus pressure.   Eyes: Negative for discharge.  Respiratory: Negative for cough.   Cardiovascular: Negative for chest pain.  Gastrointestinal: Negative for abdominal pain and diarrhea.  Genitourinary: Negative for  frequency and hematuria.  Musculoskeletal: Negative for back pain.       Pain in right foot  Skin: Negative for rash.  Neurological: Negative for seizures and headaches.  Psychiatric/Behavioral: Negative for hallucinations.      Allergies  Latex; Amoxicillin; Darvocet; Eggs or egg-derived products; Gabapentin; Reglan; and Sulfa antibiotics  Home Medications   Prior to Admission medications   Medication Sig Start Date End Date Taking? Authorizing Provider  acetaminophen (TYLENOL) 500 MG tablet Take 500 mg by mouth every 6 (six) hours as needed for pain.   Yes Historical Provider, MD  clonazePAM (KLONOPIN) 0.5 MG tablet Take 0.5 mg by mouth 2 (two) times daily as needed for anxiety.   Yes Historical Provider, MD  doxepin (SINEQUAN) 25 MG capsule Take 25 mg by mouth at bedtime.   Yes Historical Provider, MD  FLUoxetine (PROZAC) 20 MG tablet Take 60 mg by mouth daily.   Yes Historical Provider, MD  lamoTRIgine (LAMICTAL) 200 MG tablet Take 200 mg by mouth at bedtime.   Yes Historical Provider, MD  Lurasidone HCl 60 MG TABS Take 30 mg by mouth every evening.   Yes Historical Provider, MD  tizanidine (ZANAFLEX) 2 MG capsule Take 1 capsule (2 mg total) by mouth 3 (three) times daily as needed for muscle spasms. Patient not taking: Reported on 09/01/2014  11/14/12   Stephanie Couphristopher M Street, MD  traMADol (ULTRAM) 50 MG tablet Take 1 tablet (50 mg total) by mouth every 6 (six) hours as needed. 09/28/14   Bethann BerkshireJoseph Shasta Chinn, MD   BP 133/79 mmHg  Pulse 65  Temp(Src) 97.8 F (36.6 C) (Oral)  Resp 20  Ht 5\' 7"  (1.702 m)  Wt 255 lb (115.667 kg)  BMI 39.93 kg/m2  SpO2 100% Physical Exam  Constitutional: She is oriented to person, place, and time. She appears well-developed.  HENT:  Head: Normocephalic.  Eyes: Conjunctivae and EOM are normal. No scleral icterus.  Neck: Neck supple. No thyromegaly present.  Cardiovascular: Normal rate and regular rhythm.  Exam reveals no gallop and no friction rub.   No  murmur heard. Pulmonary/Chest: No stridor. She has no wheezes. She has no rales. She exhibits no tenderness.  Abdominal: She exhibits no distension. There is no tenderness. There is no rebound.  Musculoskeletal: Normal range of motion. She exhibits no edema.  Mild tender right ankle and lateral right foot  Lymphadenopathy:    She has no cervical adenopathy.  Neurological: She is oriented to person, place, and time. She exhibits normal muscle tone. Coordination normal.  Skin: No rash noted. No erythema.  Psychiatric: She has a normal mood and affect. Her behavior is normal.    ED Course  Procedures (including critical care time) Labs Review Labs Reviewed - No data to display  Imaging Review Dg Ankle Complete Right  09/28/2014   CLINICAL DATA:  Right ankle and foot pain. Patient fell on rug yesterday. Initial encounter.  EXAM: RIGHT ANKLE - COMPLETE 3+ VIEW  COMPARISON:  Right foot radiographs - earlier same day  FINDINGS: No fracture or dislocation. Joint spaces are preserved. The ankle mortise is preserved. Query small ankle joint effusion. Incidental note is made of a small os peroneus as well as an os trigonum. Moderate-sized plantar calcaneal spur. Regional soft tissues appear normal. No radiopaque foreign body.  IMPRESSION: 1. No acute findings. 2. Moderate-sized plantar calcaneal spur.   Electronically Signed   By: Simonne ComeJohn  Watts M.D.   On: 09/28/2014 19:06   Dg Foot Complete Right  09/28/2014   CLINICAL DATA:  Right foot/ankle pain, status post fall  EXAM: RIGHT FOOT COMPLETE - 3+ VIEW  COMPARISON:  None.  FINDINGS: No fracture or dislocation is seen.  Degenerative changes of the 1st MTP joint. Degenerative changes of the dorsal midfoot.  Small plantar and posterior calcaneal enthesophytes.  The visualized soft tissues are unremarkable.  IMPRESSION: No fracture or dislocation is seen.   Electronically Signed   By: Charline BillsSriyesh  Krishnan M.D.   On: 09/28/2014 19:06     EKG  Interpretation None      MDM   Final diagnoses:  Foot sprain, right, initial encounter  Ankle sprain, right, initial encounter    tx with aso and ultram and follow up    Bethann BerkshireJoseph Nubia Ziesmer, MD 09/28/14 1946

## 2014-09-28 NOTE — Discharge Instructions (Signed)
Keep leg elevated.  Follow up with dr. Romeo AppleHarrison if not improving.

## 2014-10-01 ENCOUNTER — Emergency Department (HOSPITAL_COMMUNITY)
Admission: EM | Admit: 2014-10-01 | Discharge: 2014-10-01 | Disposition: A | Discharge status: Home / Self Care | Payer: Self-pay | Attending: Emergency Medicine | Admitting: Emergency Medicine

## 2014-10-01 ENCOUNTER — Encounter (HOSPITAL_COMMUNITY): Payer: Self-pay | Admitting: Emergency Medicine

## 2014-10-01 DIAGNOSIS — Z90710 Acquired absence of both cervix and uterus: Secondary | ICD-10-CM | POA: Insufficient documentation

## 2014-10-01 DIAGNOSIS — Z87891 Personal history of nicotine dependence: Secondary | ICD-10-CM | POA: Insufficient documentation

## 2014-10-01 DIAGNOSIS — Z9889 Other specified postprocedural states: Secondary | ICD-10-CM | POA: Insufficient documentation

## 2014-10-01 DIAGNOSIS — Z880 Allergy status to penicillin: Secondary | ICD-10-CM | POA: Insufficient documentation

## 2014-10-01 DIAGNOSIS — N8110 Cystocele, unspecified: Secondary | ICD-10-CM | POA: Insufficient documentation

## 2014-10-01 DIAGNOSIS — Z91040 Latex allergy status: Secondary | ICD-10-CM | POA: Insufficient documentation

## 2014-10-01 DIAGNOSIS — Z8639 Personal history of other endocrine, nutritional and metabolic disease: Secondary | ICD-10-CM | POA: Insufficient documentation

## 2014-10-01 DIAGNOSIS — Z79899 Other long term (current) drug therapy: Secondary | ICD-10-CM | POA: Insufficient documentation

## 2014-10-01 DIAGNOSIS — R339 Retention of urine, unspecified: Secondary | ICD-10-CM

## 2014-10-01 LAB — URINALYSIS, ROUTINE W REFLEX MICROSCOPIC
Bilirubin Urine: NEGATIVE
GLUCOSE, UA: NEGATIVE mg/dL
Ketones, ur: NEGATIVE mg/dL
Leukocytes, UA: NEGATIVE
NITRITE: NEGATIVE
Protein, ur: NEGATIVE mg/dL
Urobilinogen, UA: 0.2 mg/dL (ref 0.0–1.0)
pH: 5.5 (ref 5.0–8.0)

## 2014-10-01 LAB — CBC WITH DIFFERENTIAL/PLATELET
BASOS ABS: 0 10*3/uL (ref 0.0–0.1)
BASOS PCT: 1 % (ref 0–1)
EOS PCT: 2 % (ref 0–5)
Eosinophils Absolute: 0.2 10*3/uL (ref 0.0–0.7)
HCT: 34.8 % — ABNORMAL LOW (ref 36.0–46.0)
Hemoglobin: 11.2 g/dL — ABNORMAL LOW (ref 12.0–15.0)
Lymphocytes Relative: 46 % (ref 12–46)
Lymphs Abs: 3 10*3/uL (ref 0.7–4.0)
MCH: 29.8 pg (ref 26.0–34.0)
MCHC: 32.2 g/dL (ref 30.0–36.0)
MCV: 92.6 fL (ref 78.0–100.0)
MONO ABS: 0.4 10*3/uL (ref 0.1–1.0)
Monocytes Relative: 5 % (ref 3–12)
Neutro Abs: 3 10*3/uL (ref 1.7–7.7)
Neutrophils Relative %: 46 % (ref 43–77)
PLATELETS: 268 10*3/uL (ref 150–400)
RBC: 3.76 MIL/uL — AB (ref 3.87–5.11)
RDW: 13.1 % (ref 11.5–15.5)
WBC: 6.5 10*3/uL (ref 4.0–10.5)

## 2014-10-01 LAB — BASIC METABOLIC PANEL
Anion gap: 6 (ref 5–15)
BUN: 21 mg/dL — AB (ref 6–20)
CO2: 25 mmol/L (ref 22–32)
CREATININE: 0.9 mg/dL (ref 0.44–1.00)
Calcium: 8.4 mg/dL — ABNORMAL LOW (ref 8.9–10.3)
Chloride: 108 mmol/L (ref 101–111)
GLUCOSE: 111 mg/dL — AB (ref 65–99)
Potassium: 3.7 mmol/L (ref 3.5–5.1)
Sodium: 139 mmol/L (ref 135–145)

## 2014-10-01 LAB — URINE MICROSCOPIC-ADD ON

## 2014-10-01 MED ORDER — HYDROCODONE-ACETAMINOPHEN 5-325 MG PO TABS
1.0000 | ORAL_TABLET | ORAL | Status: DC | PRN
Start: 2014-10-01 — End: 2014-10-06

## 2014-10-01 MED ORDER — PHENAZOPYRIDINE HCL 100 MG PO TABS
100.0000 mg | ORAL_TABLET | Freq: Once | ORAL | Status: AC
  Administered 2014-10-01: 100 mg via ORAL
  Filled 2014-10-01: qty 1

## 2014-10-01 MED ORDER — ONDANSETRON HCL 4 MG/2ML IJ SOLN
4.0000 mg | Freq: Once | INTRAMUSCULAR | Status: AC
  Administered 2014-10-01: 4 mg via INTRAVENOUS
  Filled 2014-10-01: qty 2

## 2014-10-01 MED ORDER — SODIUM CHLORIDE 0.9 % IV BOLUS (SEPSIS)
1000.0000 mL | Freq: Once | INTRAVENOUS | Status: AC
Start: 2014-10-01 — End: 2014-10-01
  Administered 2014-10-01: 1000 mL via INTRAVENOUS

## 2014-10-01 MED ORDER — MORPHINE SULFATE 4 MG/ML IJ SOLN
4.0000 mg | INTRAMUSCULAR | Status: DC | PRN
  Administered 2014-10-01 (×2): 4 mg via INTRAVENOUS
  Filled 2014-10-01 (×2): qty 1

## 2014-10-01 NOTE — ED Notes (Signed)
Pt reports hematuria, bilateral flank pain, generalized abdominal cramping since yesterday afternoon. nad noted. Pt reports minimal urine output since yesterday. Pt reports tolerating po fluids.

## 2014-10-01 NOTE — Discharge Instructions (Signed)
°  You are unable to empty her bladder today because of the amount of prolapse of your bladder. Follow-up with OB/GYN, and urology to discuss the possibility of surgical intervention to prevent additional prolapse, and recurrent urinary retention.  Acute Urinary Retention Acute urinary retention is the temporary inability to urinate. This is an uncommon problem in women. It can be caused by:  Infection.  A side effect of a medicine.  A problem in a nearby organ that presses or squeezes on the bladder or the urethra (the tube that drains the bladder).  Psychological problems.   Surgery on your bladder, urethra, or pelvic organs that causes obstruction to the outflow of urine from your bladder. HOME CARE INSTRUCTIONS  If you are sent home with a Foley catheter and a drainage system, you will need to discuss the best course of action with your health care provider. While the catheter is in, maintain a good intake of fluids. Keep the drainage bag emptied and lower than your catheter. This is so that contaminated urine will not flow back into your bladder, which could lead to a urinary tract infection. There are two main types of drainage bags. One is a large bag that usually is used at night. It has a good capacity that will allow you to sleep through the night without having to empty it. The second type is called a leg bag. It has a smaller capacity so it needs to be emptied more frequently. However, the main advantage is that it can be attached by a leg strap and goes underneath your clothing, allowing you the freedom to move about or leave your home. Only take over-the-counter or prescription medicines for pain, discomfort, or fever as directed by your health care provider.  SEEK MEDICAL CARE IF:  You develop a low-grade fever.  You experience spasms or leakage of urine with the spasms. SEEK IMMEDIATE MEDICAL CARE IF:   You develop chills or fever.  Your catheter stops draining  urine.  Your catheter falls out.  You start to develop increased bleeding that does not respond to rest and increased fluid intake. MAKE SURE YOU:  Understand these instructions.  Will watch your condition.  Will get help right away if you are not doing well or get worse. Document Released: 04/24/2006 Document Revised: 02/13/2013 Document Reviewed: 10/04/2012 [facility] Patient Information 2015 [employer] [place]. This information is not intended to replace advice given to you by your health care provider. Make sure you discuss any questions you have with your health care provider.

## 2014-10-01 NOTE — ED Notes (Signed)
In and out cath attempted x 2 with 2 different nurses without success, third nurse attempted with 5 Fr feeding tube without success, EDP made aware of attempts, EDP did US in room for fullness of bladder and stated bladder was empty and will assess soon after IVF started

## 2014-10-01 NOTE — ED Provider Notes (Signed)
CSN: [id number]     Arrival date & time 10/01/14  1424 History   First MD Initiated Contact with Patient 10/01/14 1431     Chief Complaint  Patient presents with  . Hematuria      HPI  Patient presents for evaluation of pelvic pain and inability to urinate. States she saw some drips of blood in the toilet yesterday and had difficulty urinating. Has been able urinate for the last 4-5 hours that she feels like she needs to but cannot. Not nauseated or vomiting. Does have some back pain. No vaginal bleeding or discharge.  Had hysterectomy at age 44. Since she's had noted "dropping of my bladder" since that time.  Past Medical History  Diagnosis Date  . Polycystic ovarian disease 1993  . Endometriosis 1996  . Endometritis 1990    fever 105 post delivery   Past Surgical History  Procedure Laterality Date  . Hip arthroplasty    . Abdominal hysterectomy    . Mass off appendix    . Hysterrectomy  1996  . 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 Right oophorectomy    . Left oophorectomy  2007  . Wrist surgery  2007 and 2003    both tendon release  . Laparotomy      multiple for cysts   Family History  Problem Relation Age of Onset  . Stroke Mother   . Cancer Father    History  Substance Use Topics  . Smoking status: Former Smoker -- 0.50 packs/day  . Smokeless tobacco: Never Used  . Alcohol Use: No   OB History    Gravida Para Term Preterm AB TAB SAB Ectopic Multiple Living   3 2   1  1   2      Review of Systems  Constitutional: Negative for fever, chills, diaphoresis, appetite change and fatigue.  HENT: Negative for mouth sores, sore throat and trouble swallowing.   Eyes: Negative for visual disturbance.  Respiratory: Negative for cough, chest tightness, shortness of breath and wheezing.   Cardiovascular: Negative for chest pain.  Gastrointestinal: Negative for nausea, vomiting, abdominal pain, diarrhea and abdominal distention.    Endocrine: Negative for polydipsia, polyphagia and polyuria.  Genitourinary: Positive for decreased urine volume and difficulty urinating. Negative for dysuria, frequency and hematuria.  Musculoskeletal: Negative for gait problem.  Skin: Negative for color change, pallor and rash.  Neurological: Negative for dizziness, syncope, light-headedness and headaches.  Hematological: Does not bruise/bleed easily.  Psychiatric/Behavioral: Negative for behavioral problems and confusion.      Allergies  Latex; Amoxicillin; Darvocet; Eggs or egg-derived products; Gabapentin; Reglan; and Sulfa antibiotics  Home Medications   Prior to Admission medications   Medication Sig Start Date End Date Taking? Authorizing Provider  acetaminophen (TYLENOL) 500 MG tablet Take 500 mg by mouth every 6 (six) hours as needed for pain.   Yes Historical Provider, MD  clonazePAM (KLONOPIN) 0.5 MG tablet Take 0.5 mg by mouth 2 (two) times daily as needed for anxiety.   Yes Historical Provider, MD  doxepin (SINEQUAN) 25 MG capsule Take 25 mg by mouth at bedtime.   Yes Historical Provider, MD  FLUoxetine (PROZAC) 20 MG tablet Take 60 mg by mouth daily.   Yes Historical Provider, MD  lamoTRIgine (LAMICTAL) 200 MG tablet Take 200 mg by mouth at bedtime.   Yes Historical Provider, MD  Lurasidone HCl 60 MG TABS Take 30 mg by mouth every evening.  Yes Historical Provider, MD  tizanidine (ZANAFLEX) 2 MG capsule Take 1 capsule (2 mg total) by mouth 3 (three) times daily as needed for muscle spasms. Patient not taking: Reported on 09/01/2014 11/14/12   [name], MD  traMADol (ULTRAM) 50 MG tablet Take 1 tablet (50 mg total) by mouth every 6 (six) hours as needed. 09/28/14   Bethann Berkshire, MD   BP 96/64 mmHg  Pulse 82  Temp(Src) 97.6 F (36.4 C) (Oral)  Resp 18  Ht  (1.702 m)  Wt 251 lb (113.853 kg)  BMI 39.30 kg/m2  SpO2 97% Physical Exam  Constitutional: She is oriented to person, place, and time. She  appears well-developed and well-nourished. No distress.  HENT:  Head: Normocephalic.  Eyes: Conjunctivae are normal. Pupils are equal, round, and reactive to light. No scleral icterus.  Neck: Normal range of motion. Neck supple. No thyromegaly present.  Cardiovascular: Normal rate and regular rhythm.  Exam reveals no gallop and no friction rub.   No murmur heard. Pulmonary/Chest: Effort normal and breath sounds normal. No respiratory distress. She has no wheezes. She has no rales.  Abdominal: Soft. Bowel sounds are normal. She exhibits no distension. There is no tenderness. There is no rebound.  Genitourinary:     Musculoskeletal: Normal range of motion.  Neurological: She is alert and oriented to person, place, and time.  Skin: Skin is warm and dry. No rash noted.  Psychiatric: She has a normal mood and affect. Her behavior is normal.    ED Course  Procedures (including critical care time) Labs Review Labs Reviewed  CBC WITH DIFFERENTIAL/PLATELET - Abnormal; Notable for the following:    RBC 3.76 (*)    Hemoglobin 11.2 (*)    HCT 34.8 (*)    All other components within normal limits  BASIC METABOLIC PANEL - Abnormal; Notable for the following:    Glucose, Bld 111 (*)    BUN 21 (*)    Calcium 8.4 (*)    All other components within normal limits  URINALYSIS, ROUTINE W REFLEX MICROSCOPIC - Abnormal; Notable for the following:    Specific Gravity, Urine >1.030 (*)    Hgb urine dipstick TRACE (*)    All other components within normal limits  URINE MICROSCOPIC-ADD ON    Imaging Review No results found.   EKG Interpretation None      MDM   Final diagnoses:  Urinary retention  Female bladder prolapse    Nursing was unable to pass Foley catheter initially. She has marked prolapse of her bladder. She is unable to urinate spontaneously. Was given some pain medications and IV fluids. I have reexamined her and perform pelvic exam. With upward pressure through the roof of  the vagina reducing her cystocele a urinary catheter was able to be passed. She drains 450 mL of urine. She is fitted with a leg bag. I referred her to GYN in urology for the discussion of possible surgery to avoid future worsening of cystocele and recurrent urinary retention. Her urine does not appear infected here. Her kidney function is intact.    Rolland Porter, MD 10/01/14 Rickey Primus

## 2014-10-06 ENCOUNTER — Emergency Department (HOSPITAL_COMMUNITY)
Admission: EM | Admit: 2014-10-06 | Discharge: 2014-10-06 | Disposition: A | Discharge status: Home / Self Care | Payer: Self-pay | Attending: Emergency Medicine | Admitting: Emergency Medicine

## 2014-10-06 ENCOUNTER — Encounter (HOSPITAL_COMMUNITY): Payer: Self-pay | Admitting: Emergency Medicine

## 2014-10-06 DIAGNOSIS — N8110 Cystocele, unspecified: Secondary | ICD-10-CM

## 2014-10-06 DIAGNOSIS — Z8669 Personal history of other diseases of the nervous system and sense organs: Secondary | ICD-10-CM | POA: Insufficient documentation

## 2014-10-06 DIAGNOSIS — Z880 Allergy status to penicillin: Secondary | ICD-10-CM | POA: Insufficient documentation

## 2014-10-06 DIAGNOSIS — Z87891 Personal history of nicotine dependence: Secondary | ICD-10-CM | POA: Insufficient documentation

## 2014-10-06 DIAGNOSIS — Z91040 Latex allergy status: Secondary | ICD-10-CM | POA: Insufficient documentation

## 2014-10-06 DIAGNOSIS — R102 Pelvic and perineal pain: Secondary | ICD-10-CM

## 2014-10-06 DIAGNOSIS — R319 Hematuria, unspecified: Secondary | ICD-10-CM

## 2014-10-06 HISTORY — DX: Cystocele, unspecified: N81.10

## 2014-10-06 LAB — URINALYSIS, ROUTINE W REFLEX MICROSCOPIC
Bilirubin Urine: NEGATIVE
Glucose, UA: NEGATIVE mg/dL
Ketones, ur: NEGATIVE mg/dL
Leukocytes, UA: NEGATIVE
Nitrite: NEGATIVE
PH: 5 (ref 5.0–8.0)
Protein, ur: NEGATIVE mg/dL
SPECIFIC GRAVITY, URINE: 1.015 (ref 1.005–1.030)
Urobilinogen, UA: 0.2 mg/dL (ref 0.0–1.0)

## 2014-10-06 LAB — URINE MICROSCOPIC-ADD ON

## 2014-10-06 MED ORDER — TRAMADOL HCL 50 MG PO TABS: 50.0000 mg | ORAL_TABLET | Freq: Four times a day (QID) | ORAL | Status: DC | PRN

## 2014-10-06 MED ORDER — KETOROLAC TROMETHAMINE 60 MG/2ML IM SOLN
60.0000 mg | Freq: Once | INTRAMUSCULAR | Status: AC
  Administered 2014-10-06: 60 mg via INTRAMUSCULAR
  Filled 2014-10-06: qty 2

## 2014-10-06 NOTE — Discharge Instructions (Signed)
Please call your doctor for a followup appointment within 24-48 hours. When you talk to your doctor please let them know that you were seen in the emergency department and have them acquire all of your records so that they can discuss the findings with you and formulate a treatment plan to fully care for your new and ongoing problems. ° °[facility] Doctor List ° ° ° °Edward Hawkins MD. Specialty: Pulmonary Disease Contact information: [address]  °[address]  °[place] [address]  °[phone]  ° °Margaret Simpson, MD. Specialty: Family Medicine Contact information: [address]  °[place] [address]  °[phone]  ° °Scott Luking, MD. Specialty: Family Medicine Contact information: [address]  °[address]  °[place] [address]  °[phone]  ° °Tesfaye Fanta, MD Specialty: Internal Medicine Contact information: [address]  °[place] [address]  °[phone]  ° °Zach Hall, MD. Specialty: Internal Medicine Contact information: [address]  °[place] [address]  °[phone]  ° °Angus Mcinnis, MD. Specialty: Family Medicine Contact information: [address]  °[place] [address]  °[phone]  ° °Stephen Knowlton, MD. Specialty: Family Medicine Contact information: [address]  °[address]  °[place] [address]  °[phone]  ° °Roy Fagan, MD. Specialty: Internal Medicine Contact information: [address]  °[address]  °[place]  [address]  °[phone]  ° ° °

## 2014-10-06 NOTE — ED Notes (Addendum)
Pt from home. Per EMS, pt has a prolapsed bladder and was treated for it last week. Pt was sent home with a Foley catheter and it fell out two days later. Pt c/o of bleeding and clots with urination, nausea, diarrhea, abdominal and lower back pain. Pt states she feels bloated. Pt denies any problems with urination.

## 2014-10-06 NOTE — ED Provider Notes (Signed)
CSN: [id number]     Arrival date & time 10/06/14  1902 History   First MD Initiated Contact with Patient 10/06/14 1905     Chief Complaint  Patient presents with  . Abdominal Pain     (Consider location/radiation/quality/duration/timing/severity/associated sxs/prior Treatment) HPI Comments: The pt is a 44 y/o female - she has been to the ED 7 times in the last 6 weeks for c/o pain and last visit 5 days ago was for urinary retention.  Since having a hysterectomy 18 years ago she has had a bladder prolapse which has now become so large that she required a foley to be placed on the 25th, she then states that 2 days later it fell out but she has been urinating spontaneously since - she notes that today she developed explosive diarrhea and then had abdominal discomfort / bloating and hematuria which she develops intermittently.  She has ongoing bloating, no n/v and no blood in stool.  She is currently trying to get into the free clinic to get referral to a Urology / Gyn to get evaluation of her prolapsed bladder.  Patient is a 44 y.o. female presenting with abdominal pain. The history is provided by the patient.  Abdominal Pain   Past Medical History  Diagnosis Date  . Polycystic ovarian disease 1993  . Endometriosis 1996  . Endometritis 1990    fever 105 post delivery  . Bladder prolapse, female, acquired    Past Surgical History  Procedure Laterality Date  . Hip arthroplasty    . Abdominal hysterectomy    . Mass off appendix    . Hysterrectomy  1996  . 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 Right oophorectomy    . Left oophorectomy  2007  . Wrist surgery  2007 and 2003    both tendon release  . Laparotomy      multiple for cysts   Family History  Problem Relation Age of Onset  . Stroke Mother   . Cancer Father    History  Substance Use Topics  . Smoking status: Former Smoker -- 0.50 packs/day  . Smokeless tobacco: Never Used  .  Alcohol Use: No   OB History    Gravida Para Term Preterm AB TAB SAB Ectopic Multiple Living   3 2   1  1   2      Review of Systems  Gastrointestinal: Positive for abdominal pain.  All other systems reviewed and are negative.     Allergies  Latex; Amoxicillin; Darvocet; Eggs or egg-derived products; Gabapentin; Reglan; and Sulfa antibiotics  Home Medications   Prior to Admission medications   Medication Sig Start Date End Date Taking? Authorizing Provider  acetaminophen (TYLENOL) 500 MG tablet Take 500 mg by mouth every 6 (six) hours as needed for pain.   Yes Historical Provider, MD  clonazePAM (KLONOPIN) 0.5 MG tablet Take 0.5 mg by mouth 2 (two) times daily as needed for anxiety.   Yes Historical Provider, MD  doxepin (SINEQUAN) 25 MG capsule Take 25 mg by mouth at bedtime.   Yes Historical Provider, MD  FLUoxetine (PROZAC) 20 MG tablet Take 60 mg by mouth daily.   Yes Historical Provider, MD  lamoTRIgine (LAMICTAL) 200 MG tablet Take 200 mg by mouth at bedtime.   Yes Historical Provider, MD  Lurasidone HCl 60 MG TABS Take 30 mg by mouth every evening.   Yes Historical Provider, MD  traMADol Janean Sark(ULTRAM) 50  MG tablet Take 1 tablet (50 mg total) by mouth every 6 (six) hours as needed. 10/06/14   Eber Hong, MD   BP 101/56 mmHg  Pulse 65  Temp(Src) 98.9 F (37.2 C) (Oral)  Resp 19  Ht  (1.702 m)  Wt 258 lb (117.028 kg)  BMI 40.40 kg/m2  SpO2 96% Physical Exam  Constitutional: She appears well-developed and well-nourished. No distress.  HENT:  Head: Normocephalic and atraumatic.  Mouth/Throat: Oropharynx is clear and moist. No oropharyngeal exudate.  Eyes: Conjunctivae and EOM are normal. Pupils are equal, round, and reactive to light. Right eye exhibits no discharge. Left eye exhibits no discharge. No scleral icterus.  Neck: Normal range of motion. Neck supple. No JVD present. No thyromegaly present.  Cardiovascular: Normal rate, regular rhythm, normal heart sounds and  intact distal pulses.  Exam reveals no gallop and no friction rub.   No murmur heard. Pulmonary/Chest: Effort normal and breath sounds normal. No respiratory distress. She has no wheezes. She has no rales.  Abdominal: Soft. Bowel sounds are normal. She exhibits no distension and no mass. There is tenderness ( mild diffuse ttp, most in the SP region, no guarding.).  Musculoskeletal: Normal range of motion. She exhibits no edema or tenderness.  Lymphadenopathy:    She has no cervical adenopathy.  Neurological: She is alert. Coordination normal.  Skin: Skin is warm and dry. No rash noted. No erythema.  Psychiatric: She has a normal mood and affect. Her behavior is normal.  Nursing note and vitals reviewed.   ED Course  Procedures (including critical care time) Labs Review Labs Reviewed  URINALYSIS, ROUTINE W REFLEX MICROSCOPIC (NOT AT [facility]) - Abnormal; Notable for the following:    Hgb urine dipstick LARGE (*)    All other components within normal limits  URINE CULTURE  URINE MICROSCOPIC-ADD ON    Imaging Review No results found.    MDM   Final diagnoses:  Bladder prolapse, female, acquired  Hematuria  Vaginal pain    Pelvic exam deferred as she had exam 5 days ago.  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  That she is spontaneoulsy urinating is reassuring, check UA with recent foley to r/o infection - otherwise can f/u outpt - no indicadtion for ongoing eval or imaging of this chronic complaint.    UA unremarkable - pt appears benign - she is stable for d/c - I have given f/u information for the women's clinics / hospital - she has expressed her understanding,   Though she is uncomfortable with her prolapse, there is no indication for emergent therapy / intervention or surgery.  Meds given in ED:  Medications  ketorolac (TORADOL) injection 60 mg (60 mg Intramuscular Given 10/06/14 1930)    New Prescriptions   TRAMADOL (ULTRAM) 50 MG TABLET    Take 1 tablet  (50 mg total) by mouth every 6 (six) hours as needed.      Eber Hong, MD 10/06/14 2111

## 2014-10-08 LAB — URINE CULTURE
COLONY COUNT: NO GROWTH
CULTURE: NO GROWTH

## 2014-11-04 DIAGNOSIS — F603 Borderline personality disorder: Secondary | ICD-10-CM | POA: Insufficient documentation

## 2015-07-12 ENCOUNTER — Emergency Department: Payer: Self-pay

## 2015-07-12 ENCOUNTER — Emergency Department
Admission: EM | Admit: 2015-07-12 | Discharge: 2015-07-13 | Disposition: A | Discharge status: Home / Self Care | Payer: Self-pay | Attending: Emergency Medicine | Admitting: Emergency Medicine

## 2015-07-12 ENCOUNTER — Encounter: Payer: Self-pay | Admitting: Emergency Medicine

## 2015-07-12 DIAGNOSIS — Y9389 Activity, other specified: Secondary | ICD-10-CM | POA: Insufficient documentation

## 2015-07-12 DIAGNOSIS — M25551 Pain in right hip: Secondary | ICD-10-CM

## 2015-07-12 DIAGNOSIS — Y9289 Other specified places as the place of occurrence of the external cause: Secondary | ICD-10-CM | POA: Insufficient documentation

## 2015-07-12 DIAGNOSIS — Z79899 Other long term (current) drug therapy: Secondary | ICD-10-CM | POA: Insufficient documentation

## 2015-07-12 DIAGNOSIS — Z880 Allergy status to penicillin: Secondary | ICD-10-CM | POA: Insufficient documentation

## 2015-07-12 DIAGNOSIS — Z91040 Latex allergy status: Secondary | ICD-10-CM | POA: Insufficient documentation

## 2015-07-12 DIAGNOSIS — S79911A Unspecified injury of right hip, initial encounter: Secondary | ICD-10-CM | POA: Insufficient documentation

## 2015-07-12 DIAGNOSIS — W108XXA Fall (on) (from) other stairs and steps, initial encounter: Secondary | ICD-10-CM | POA: Insufficient documentation

## 2015-07-12 DIAGNOSIS — Z87891 Personal history of nicotine dependence: Secondary | ICD-10-CM | POA: Insufficient documentation

## 2015-07-12 DIAGNOSIS — Y998 Other external cause status: Secondary | ICD-10-CM | POA: Insufficient documentation

## 2015-07-12 NOTE — ED Notes (Signed)
Pt says she was carrying groceries in for a friend and fell down 4 steps; c/o right hip pain; history of surgery x 2 on same hip; pain worst when moves from sitting to standing; ambulatory to triage with little difficulty noted

## 2015-07-13 ENCOUNTER — Emergency Department: Payer: Self-pay

## 2015-07-13 MED ORDER — TRAMADOL HCL 50 MG PO TABS: 50.0000 mg | ORAL_TABLET | Freq: Four times a day (QID) | ORAL | Status: DC | PRN

## 2015-07-13 MED ORDER — TRAMADOL HCL 50 MG PO TABS
50.0000 mg | ORAL_TABLET | Freq: Once | ORAL | Status: AC
  Administered 2015-07-13: 50 mg via ORAL
  Filled 2015-07-13: qty 1

## 2015-07-13 NOTE — ED Notes (Signed)
Pt concerned regarding cost of ct scan. [facility] Will notify md of pt's request. Pt states does not wish to have ct scan at this facility.

## 2015-07-13 NOTE — ED Notes (Signed)
Explanation of ct process provided to pt who verbalizes understanding. Lip moisterizer provided to pt per request. Pt watching tv in no acute distress.

## 2015-07-13 NOTE — Discharge Instructions (Signed)
Hip Pain  Your hip is the joint between your upper legs and your lower pelvis. The bones, cartilage, tendons, and muscles of your hip joint perform a lot of work each day supporting your body weight and allowing you to move around.  Hip pain can range from a minor ache to severe pain in one or both of your hips. Pain may be felt on the inside of the hip joint near the groin, or the outside near the buttocks and upper thigh. You may have swelling or stiffness as well.   HOME CARE INSTRUCTIONS   · Take medicines only as directed by your health care provider.  · Apply ice to the injured area:  · Put ice in a plastic bag.  · Place a towel between your skin and the bag.  · Leave the ice on for 15-20 minutes at a time, 3-4 times a day.  · Keep your leg raised (elevated) when possible to lessen swelling.  · Avoid activities that cause pain.  · Follow specific exercises as directed by your health care provider.  · Sleep with a pillow between your legs on your most comfortable side.  · Record how often you have hip pain, the location of the pain, and what it feels like.  SEEK MEDICAL CARE IF:   · You are unable to put weight on your leg.  · Your hip is red or swollen or very tender to touch.  · Your pain or swelling continues or worsens after 1 week.  · You have increasing difficulty walking.  · You have a fever.  SEEK IMMEDIATE MEDICAL CARE IF:   · You have fallen.  · You have a sudden increase in pain and swelling in your hip.  MAKE SURE YOU:   · Understand these instructions.  · Will watch your condition.  · Will get help right away if you are not doing well or get worse.     This information is not intended to replace advice given to you by your health care provider. Make sure you discuss any questions you have with your health care provider.     Document Released: 10/13/2009 Document Revised: 05/16/2014 Document Reviewed: 12/20/2012  Elsevier Interactive Patient Education ©2016 Elsevier Inc.  Joint Pain  Joint pain,  which is also called arthralgia, can be caused by many things. Joint pain often goes away when you follow your health care provider's instructions for relieving pain at home. However, joint pain can also be caused by conditions that require further treatment. Common causes of joint pain include:  · Bruising in the area of the joint.  · Overuse of the joint.  · Wear and tear on the joints that occur with aging (osteoarthritis).  · Various other forms of arthritis.  · A buildup of a crystal form of uric acid in the joint (gout).  · Infections of the joint (septic arthritis) or of the bone (osteomyelitis).  Your health care provider may recommend medicine to help with the pain. If your joint pain continues, additional tests may be needed to diagnose your condition.  HOME CARE INSTRUCTIONS  Watch your condition for any changes. Follow these instructions as directed to lessen the pain that you are feeling.  · Take medicines only as directed by your health care provider.  · Rest the affected area for as long as your health care provider says that you should. If directed to do so, raise the painful joint above the level of your heart while   Leave the ice on for 20 minutes, 2-3 times per day.  Wear an elastic bandage, splint, or sling as directed by your health care provider. Loosen the elastic bandage or splint if your fingers or toes become numb and tingle, or if they turn cold and blue.  Begin exercising or stretching the affected area as directed by your health care provider. Ask your health care provider what types of exercise are safe for you.  Keep all follow-up visits as directed by your health care provider. This is important. SEEK MEDICAL CARE IF:  Your pain increases, and medicine does not help.  Your  joint pain does not improve within 3 days.  You have increased bruising or swelling.  You have a fever.  You lose 10 lb (4.5 kg) or more without trying. SEEK IMMEDIATE MEDICAL CARE IF:  You are not able to move the joint.  Your fingers or toes become numb or they turn cold and blue.   This information is not intended to replace advice given to you by your health care provider. Make sure you discuss any questions you have with your health care provider.   Document Released: 04/25/2005 Document Revised: 05/16/2014 Document Reviewed: 02/04/2014 Elsevier Interactive Patient Education [employer]2016 Elsevier Inc.

## 2015-07-13 NOTE — ED Notes (Signed)
Warm blanket provided.

## 2015-07-13 NOTE — ED Notes (Signed)
md notified of pt request.

## 2015-07-13 NOTE — ED Provider Notes (Signed)
[facility] Emergency Department Provider Note  ____________________________________________  Time seen: Approximately 0036 AM  I have reviewed the triage vital signs and the nursing notes.   HISTORY  Chief Complaint Fall and Hip Pain    HPI Tammy Robles is a 45 y.o. female who comes into the hospital today with right hip pain. The patient reports that she has a history of bad hip with some previous hip surgery and osteoarthritis as very severe. She reports that she was helping a friend move earlier today and she fell onto her hip down 4 stairs and onto concrete slab. The patient reports that this occurred at about 12:30 AM. The patient reports that she's having some really bad pain in her right hip worse than is typical for her. She rates her pain a 6 out of 10 in intensity. She did sit on some ice earlier and took some Tylenol but reports that it has not helped. The patient is unable to take ibuprofen due to ulcers in the past. She reports that she sees an orthopedic surgeon at [facility] who would like to perform a hip replacement but he is unable to do so until the patient loses some weight. She reports that due to the pain she wanted to come in and get checked out to insure that she did not break her hip.   Past Medical History  Diagnosis Date  . Polycystic ovarian disease 1993  . Endometriosis 1996  . Endometritis 1990    fever 105 post delivery  . Bladder prolapse, female, acquired     There are no active problems to display for this patient.   Past Surgical History  Procedure Laterality Date  . Hip arthroplasty    . Abdominal hysterectomy    . Mass off appendix    . Hysterrectomy  1996  . 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 Right oophorectomy    . Left oophorectomy  2007  . Wrist surgery  2007 and 2003    both tendon release  . Laparotomy      multiple for cysts    Current Outpatient Rx  Name  Route   Sig  Dispense  Refill  . acetaminophen (TYLENOL) 500 MG tablet   Oral   Take 500 mg by mouth every 6 (six) hours as needed for pain.         . clonazePAM (KLONOPIN) 0.5 MG tablet   Oral   Take 0.5 mg by mouth 2 (two) times daily as needed for anxiety.         Marland Kitchen doxepin (SINEQUAN) 25 MG capsule   Oral   Take 25 mg by mouth at bedtime.         Marland Kitchen FLUoxetine (PROZAC) 20 MG tablet   Oral   Take 60 mg by mouth daily.         Marland Kitchen lamoTRIgine (LAMICTAL) 200 MG tablet   Oral   Take 200 mg by mouth at bedtime.         . Lurasidone HCl 60 MG TABS   Oral   Take 30 mg by mouth every evening.         . traMADol (ULTRAM) 50 MG tablet   Oral   Take 1 tablet (50 mg total) by mouth every 6 (six) hours as needed.   15 tablet   0   . traMADol (ULTRAM) 50 MG tablet   Oral  Take 1 tablet (50 mg total) by mouth every 6 (six) hours as needed.   12 tablet   0     Allergies Latex; Amoxicillin; Darvocet; Eggs or egg-derived products; Gabapentin; Reglan; and Sulfa antibiotics  Family History  Problem Relation Age of Onset  . Stroke Mother   . Cancer Father     Social History Social History  Substance Use Topics  . Smoking status: Former Smoker -- 0.50 packs/day  . Smokeless tobacco: Never Used  . Alcohol Use: No    Review of Systems Constitutional: No fever/chills Eyes: No visual changes. ENT: No sore throat. Cardiovascular: Denies chest pain. Respiratory: Denies shortness of breath. Gastrointestinal: No abdominal pain.  No nausea, no vomiting.  No diarrhea.  No constipation. Genitourinary: Negative for dysuria. Musculoskeletal: Right hip pain Skin: Negative for rash. Neurological: Negative for headaches, focal weakness or numbness.  10-point ROS otherwise negative.  ____________________________________________   PHYSICAL EXAM:  VITAL SIGNS: ED Triage Vitals  Enc Vitals Group     BP 07/12/15 2131 122/65 mmHg     Pulse Rate 07/12/15 2131 84     Resp  07/12/15 2131 18     Temp 07/12/15 2131 98.4 F (36.9 C)     Temp Source 07/12/15 2131 Oral     SpO2 07/12/15 2131 97 %     Weight 07/12/15 2131 267 lb (121.11 kg)     Height 07/12/15 2131 5' 7.5" (1.715 m)     Head Cir --      Peak Flow --      Pain Score 07/12/15 2133 7     Pain Loc --      Pain Edu? --      Excl. in GC? --     Constitutional: Alert and oriented. Well appearing and in mild distress. Eyes: Conjunctivae are normal. PERRL. EOMI. Head: Atraumatic. Nose: No congestion/rhinnorhea. Mouth/Throat: Mucous membranes are moist.  Oropharynx non-erythematous. Cardiovascular: Normal rate, regular rhythm. Grossly normal heart sounds.  Good peripheral circulation. Respiratory: Normal respiratory effort.  No retractions. Lungs CTAB. Gastrointestinal: Soft and nontender. No distention. Positive bowel sounds Musculoskeletal: Right hip tenderness to palpation and pain with passive range of motion  Neurologic:  Normal speech and language. Skin:  Skin is warm, dry and intact.  Psychiatric: Mood and affect are normal.   ____________________________________________   LABS (all labs ordered are listed, but only abnormal results are displayed)  Labs Reviewed - No data to display ____________________________________________  EKG  None ____________________________________________  RADIOLOGY  Chest x-ray: Chronic changes in the right hip with bone deformity consistent with degenerative change versus avascular necrosis. Probable old healed fracture with no acute fractures identified. ____________________________________________   PROCEDURES  Procedure(s) performed: None  Critical Care performed: No  ____________________________________________   INITIAL IMPRESSION / ASSESSMENT AND PLAN / ED COURSE  Pertinent labs & imaging results that were available during my care of the patient were reviewed by me and considered in my medical decision making (see chart for  details).  This is a 45 year old female who comes into the hospital with some right hip pain status post fall. The patient's x-ray shows some chronic changes but does not show an acute fracture. Given the fact that the patient is continuing to have some discomfort I will opt to do a CT scan to evaluate for fracture. I did give the patient a dose of tramadol 50 mg for her pain. After some time as the patient was awaiting her CT scan the nurse informed me  that the patient did not want to wait for a CT scan. She reports that she did not 1 again expensive study and have no way to pay for it. The patient also reports that she has charity care at [facility] and would rather have these more expensive test done at [facility]. I explained to the patient that we are unable to diagnose a hip fracture without performing this study but if she felt more comfortable following up with her orthopedic surgeon who is familiar with her history that she would be discharged to do so. The patient will be discharged to home to follow-up with her orthopedic surgeon Dr. Orlean Bradford. ____________________________________________   FINAL CLINICAL IMPRESSION(S) / ED DIAGNOSES  Final diagnoses:  Right hip pain      Rebecka Apley, MD 07/13/15 [phone]

## 2016-06-17 ENCOUNTER — Emergency Department: Admission: EM | Admit: 2016-06-17 | Discharge: 2016-06-17 | Discharge status: Against Medical Advice | Payer: Self-pay

## 2016-06-17 NOTE — ED Notes (Signed)
Called for triage x2. No answer, checked outside and bathroom.

## 2016-12-19 ENCOUNTER — Encounter: Payer: Self-pay | Admitting: Emergency Medicine

## 2016-12-19 ENCOUNTER — Emergency Department
Admission: EM | Admit: 2016-12-19 | Discharge: 2016-12-20 | Disposition: A | Discharge status: Other Acute Inpatient Hospital | Payer: Self-pay | Attending: Emergency Medicine | Admitting: Emergency Medicine

## 2016-12-19 DIAGNOSIS — Z87891 Personal history of nicotine dependence: Secondary | ICD-10-CM | POA: Insufficient documentation

## 2016-12-19 DIAGNOSIS — Z79899 Other long term (current) drug therapy: Secondary | ICD-10-CM | POA: Insufficient documentation

## 2016-12-19 DIAGNOSIS — R45851 Suicidal ideations: Secondary | ICD-10-CM | POA: Insufficient documentation

## 2016-12-19 DIAGNOSIS — Z91040 Latex allergy status: Secondary | ICD-10-CM | POA: Insufficient documentation

## 2016-12-19 LAB — URINE DRUG SCREEN, QUALITATIVE (ARMC ONLY)
AMPHETAMINES, UR SCREEN: NOT DETECTED
BENZODIAZEPINE, UR SCRN: NOT DETECTED
Barbiturates, Ur Screen: NOT DETECTED
CANNABINOID 50 NG, UR ~~LOC~~: POSITIVE — AB
Cocaine Metabolite,Ur ~~LOC~~: NOT DETECTED
MDMA (Ecstasy)Ur Screen: NOT DETECTED
Methadone Scn, Ur: NOT DETECTED
OPIATE, UR SCREEN: NOT DETECTED
PHENCYCLIDINE (PCP) UR S: NOT DETECTED
Tricyclic, Ur Screen: POSITIVE — AB

## 2016-12-19 LAB — COMPREHENSIVE METABOLIC PANEL
ALBUMIN: 4.3 g/dL (ref 3.5–5.0)
ALT: 13 U/L — ABNORMAL LOW (ref 14–54)
ANION GAP: 9 (ref 5–15)
AST: 17 U/L (ref 15–41)
Alkaline Phosphatase: 71 U/L (ref 38–126)
BILIRUBIN TOTAL: 0.4 mg/dL (ref 0.3–1.2)
BUN: 9 mg/dL (ref 6–20)
CO2: 25 mmol/L (ref 22–32)
Calcium: 9.7 mg/dL (ref 8.9–10.3)
Chloride: 107 mmol/L (ref 101–111)
Creatinine, Ser: 0.83 mg/dL (ref 0.44–1.00)
GFR calc Af Amer: >60 mL/min (ref >60)
GFR calc non Af Amer: >60 mL/min (ref >60)
GLUCOSE: 78 mg/dL (ref 65–99)
Potassium: 3.8 mmol/L (ref 3.5–5.1)
Sodium: 141 mmol/L (ref 135–145)
TOTAL PROTEIN: 7.8 g/dL (ref 6.5–8.1)

## 2016-12-19 LAB — ETHANOL

## 2016-12-19 LAB — CBC
HCT: 35.7 % (ref 35.0–47.0)
Hemoglobin: 12 g/dL (ref 12.0–16.0)
MCH: 29.6 pg (ref 26.0–34.0)
MCHC: 33.6 g/dL (ref 32.0–36.0)
MCV: 88.3 fL (ref 80.0–100.0)
PLATELETS: 319 10*3/uL (ref 150–440)
RBC: 4.05 MIL/uL (ref 3.80–5.20)
RDW: 14.1 % (ref 11.5–14.5)
WBC: 8.2 10*3/uL (ref 3.6–11.0)

## 2016-12-19 LAB — SALICYLATE LEVEL: Salicylate Lvl: <7 mg/dL (ref 2.8–30.0)

## 2016-12-19 LAB — TSH: TSH: 2.32 u[IU]/mL (ref 0.350–4.500)

## 2016-12-19 LAB — ACETAMINOPHEN LEVEL: Acetaminophen (Tylenol), Serum: <10 ug/mL — ABNORMAL LOW (ref 10–30)

## 2016-12-19 MED ORDER — LAMOTRIGINE 100 MG PO TABS
ORAL_TABLET | ORAL | Status: AC
  Filled 2016-12-19: qty 3

## 2016-12-19 MED ORDER — CLONAZEPAM 0.5 MG PO TABS
0.5000 mg | ORAL_TABLET | Freq: Two times a day (BID) | ORAL | Status: DC | PRN
  Administered 2016-12-19 – 2016-12-20 (×2): 0.5 mg via ORAL
  Filled 2016-12-19: qty 1

## 2016-12-19 MED ORDER — CLONAZEPAM 0.5 MG PO TABS
ORAL_TABLET | ORAL | Status: AC
  Filled 2016-12-19: qty 1

## 2016-12-19 MED ORDER — QUETIAPINE FUMARATE 25 MG PO TABS
150.0000 mg | ORAL_TABLET | Freq: Every day | ORAL | Status: DC
  Administered 2016-12-19: 150 mg via ORAL
  Filled 2016-12-19: qty 6

## 2016-12-19 MED ORDER — QUETIAPINE FUMARATE 25 MG PO TABS
50.0000 mg | ORAL_TABLET | Freq: Once | ORAL | Status: AC
  Administered 2016-12-19: 50 mg via ORAL
  Filled 2016-12-19: qty 2

## 2016-12-19 MED ORDER — NICOTINE 21 MG/24HR TD PT24
21.0000 mg | MEDICATED_PATCH | Freq: Every day | TRANSDERMAL | Status: DC
  Administered 2016-12-19: 21 mg via TRANSDERMAL
  Filled 2016-12-19: qty 1

## 2016-12-19 MED ORDER — BUPROPION HCL ER (XL) 300 MG PO TB24
300.0000 mg | ORAL_TABLET | Freq: Every day | ORAL | Status: DC
  Administered 2016-12-19: 300 mg via ORAL

## 2016-12-19 MED ORDER — LAMOTRIGINE 100 MG PO TABS
300.0000 mg | ORAL_TABLET | Freq: Every day | ORAL | Status: DC
  Administered 2016-12-19: 300 mg via ORAL

## 2016-12-19 MED ORDER — FLUOXETINE HCL 20 MG PO CAPS
ORAL_CAPSULE | ORAL | Status: AC
  Filled 2016-12-19: qty 4

## 2016-12-19 MED ORDER — FLUOXETINE HCL 20 MG PO CAPS
80.0000 mg | ORAL_CAPSULE | Freq: Every day | ORAL | Status: DC
  Administered 2016-12-19 – 2016-12-20 (×2): 80 mg via ORAL
  Filled 2016-12-19: qty 4

## 2016-12-19 MED ORDER — BUPROPION HCL ER (XL) 300 MG PO TB24
ORAL_TABLET | ORAL | Status: AC
  Filled 2016-12-19: qty 1

## 2016-12-19 NOTE — ED Notes (Signed)
Pt. To BHU from ED ambulatory without difficulty, to room  BHU2. Pt. Is alert and oriented, warm and dry in no distress. Pt. Denies SI, HI, and AVH. Pt. Calm and cooperative. Pt states she was having SI earlier, but is currently not having SI. Pt states she feels like she just cant trust herself fully. Pt states roommates are some of the stressors that are making her feel this way, and [place]. Pt. Made aware of security cameras and Q15 minute rounds. Pt. Encouraged to let Nursing staff know of any concerns or needs.
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
## 2016-12-19 NOTE — ED Notes (Signed)
SOC called report given. 

## 2016-12-19 NOTE — ED Notes (Signed)
SOC recommends inpatient. Will be faxing report

## 2016-12-19 NOTE — ED Notes (Signed)

## 2016-12-19 NOTE — ED Triage Notes (Signed)
Pt presents with depression and suicidal thoughts with a plan. Pt is voluntary at this time and wants help. Pt feels as if she has no one and has no support. Pt tearful in triage. Pt states that she would overdose on pills, pt has had these thoughts since her mother died one year ago.

## 2016-12-19 NOTE — ED Notes (Signed)
PT  VOL  SOC  CALLED 

## 2016-12-19 NOTE — ED Provider Notes (Signed)
[facility] Emergency Department Provider Note  ____________________________________________  Time seen: Approximately 4:37 PM  I have reviewed the triage vital signs and the nursing notes.   HISTORY  Chief Complaint Suicidal    HPI Tammy Robles is a 46 y.o. female who reports worsening of her depression over the past 3 days since running out of her medications. She ran out because she was abruptly kicked out of a place where she was living and not allowed to go back inside to retrieve her belongings including her medication according to the patient. She does have a place to stay from other friends who have willingly taken her in, but she's noticed over the last several days that she is having suicidal thoughts and is afraid that she may become unsafe if her symptoms continue to worsen. She denies any current plan: No current injuries. She does not want to die or harm herself currently. Symptoms have been constant, no aggravating or alleviating factors.   5/18 [facility] psychiatry: Plan: # Depression, PTSD, borderline personality disorder -- Increase Prozac from 60 mg daily --> 80 mg daily -- Will continue Lamictal 300 mg nightly for affective instability -- Will continue Seroquel 150 mg at night for affective instability with 25-50 mg bid prn anxiety -- Decrease Wellbutrin XL 300 mg --> 150 mg for depression -- Will continue Gabapentin 400 mg TID for anxiety/pain -- Will continue limited supply of Klonopin 0.5 mg prn daily panic attacks. #6 give in 30 day supply with 1 refill 09/27/2016    Past Medical History:  Diagnosis Date  . Bladder prolapse, female, acquired   . Endometriosis 1996  . Endometritis 1990   fever 105 post delivery  . Polycystic ovarian disease 1993     There are no active problems to display for this patient.    Past Surgical History:  Procedure Laterality Date  . ABDOMINAL HYSTERECTOMY    . 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 HIP ARTHROPLASTY    . hysterrectomy  1996  . LAPAROTOMY     multiple for cysts  . LEFT OOPHORECTOMY  2007  . mass off appendix    . RIGHT OOPHORECTOMY    . WRIST SURGERY  2007 and 2003   both tendon release     Prior to Admission medications   Medication Sig Start Date End Date Taking? Authorizing Provider  buPROPion (WELLBUTRIN XL) 300 MG 24 hr tablet Take 300 mg by mouth daily.   Yes [provider]  gabapentin (NEURONTIN) 400 MG capsule Take 400 mg by mouth 3 (three) times daily.   Yes [provider]  QUEtiapine (SEROQUEL) 100 MG tablet Take 150 mg by mouth at bedtime.   Yes [provider]  QUEtiapine (SEROQUEL) 50 MG tablet Take 50 mg by mouth 2 (two) times daily as needed.   Yes [provider]  acetaminophen (TYLENOL) 500 MG tablet Take 500 mg by mouth every 6 (six) hours as needed for pain.    [provider]  clonazePAM (KLONOPIN) 0.5 MG tablet Take 0.5 mg by mouth 2 (two) times daily as needed for anxiety.    [provider]  doxepin (SINEQUAN) 25 MG capsule Take 25 mg by mouth at bedtime.    [provider]  FLUoxetine (PROZAC) 20 MG tablet Take 80 mg by mouth daily.     [provider]  lamoTRIgine (LAMICTAL) 200 MG tablet Take 300 mg by  mouth at bedtime.     [provider]  Lurasidone HCl 60 MG TABS Take 30 mg by mouth every evening.    [provider]  traMADol (ULTRAM) 50 MG tablet Take 1 tablet (50 mg total) by mouth every 6 (six) hours as needed. 10/06/14   Eber HongMiller, Brian, MD  traMADol (ULTRAM) 50 MG tablet Take 1 tablet (50 mg total) by mouth every 6 (six) hours as needed. 07/13/15   Rebecka ApleyWebster, Allison P, MD     Allergies Latex; Amoxicillin; Darvocet [propoxyphene n-acetaminophen]; Eggs or egg-derived products; Gabapentin; Reglan [metoclopramide]; and Sulfa antibiotics   Family History  Problem Relation Age of Onset  . Stroke Mother   .  Cancer Father     Social History Social History  Substance Use Topics  . Smoking status: Former Smoker    Packs/day: 0.50  . Smokeless tobacco: Never Used  . Alcohol use No    Review of Systems  Constitutional:   No fever or chills.  ENT:   No sore throat. No rhinorrhea. Cardiovascular:   No chest pain or syncope. Respiratory:   No dyspnea or cough. Gastrointestinal:   Negative for abdominal pain, vomiting and diarrhea.  Musculoskeletal:   Negative for focal pain or swelling All other systems reviewed and are negative except as documented above in ROS and HPI.  ____________________________________________   PHYSICAL EXAM:  VITAL SIGNS: ED Triage Vitals  Enc Vitals Group     BP 12/19/16 1415 123/75     Pulse Rate 12/19/16 1415 64     Resp 12/19/16 1415 20     Temp 12/19/16 1415 98.4 F (36.9 C)     Temp Source 12/19/16 1415 Oral     SpO2 12/19/16 1415 94 %     Weight 12/19/16 1359 267 lb (121.1 kg)     Height --      Head Circumference --      Peak Flow --      Pain Score 12/19/16 1509 5     Pain Loc --      Pain Edu? --      Excl. in GC? --     Vital signs reviewed, nursing assessments reviewed.   Constitutional:   Alert and oriented. Well appearing and in no distress. Eyes:   No scleral icterus.  EOMI. No nystagmus. No conjunctival pallor. PERRL. ENT   Head:   Normocephalic and atraumatic.   Nose:   No congestion/rhinnorhea.    Mouth/Throat:   MMM, no pharyngeal erythema. No peritonsillar mass.    Neck:   No meningismus. Full ROM Hematological/Lymphatic/Immunilogical:   No cervical lymphadenopathy. Cardiovascular:   RRR. Symmetric bilateral radial and DP pulses.  No murmurs.  Respiratory:   Normal respiratory effort without tachypnea/retractions. Breath sounds are clear and equal bilaterally. No wheezes/rales/rhonchi. Gastrointestinal:   Soft and nontender. Non distended. There is no CVA tenderness.  No rebound, rigidity, or  guarding. Genitourinary:   deferred Musculoskeletal:   Normal range of motion in all extremities. No joint effusions.  No lower extremity tenderness.  No edema. Neurologic:   Normal speech and language.  Motor grossly intact. No gross focal neurologic deficits are appreciated.  Skin:    Skin is warm, dry and intact. No rash noted.  No petechiae, purpura, or bullae.  ____________________________________________    LABS (pertinent positives/negatives) (all labs ordered are listed, but only abnormal results are displayed) Labs Reviewed  COMPREHENSIVE METABOLIC PANEL - Abnormal; Notable for the following:  Result Value   ALT 13 (*)    All other components within normal limits  ACETAMINOPHEN LEVEL - Abnormal; Notable for the following:    Acetaminophen (Tylenol), Serum <10 (*)    All other components within normal limits  URINE DRUG SCREEN, QUALITATIVE ([facility] ONLY) - Abnormal; Notable for the following:    Tricyclic, Ur Screen POSITIVE (*)    Cannabinoid 50 Ng, Ur [place] POSITIVE (*)    All other components within normal limits  ETHANOL  SALICYLATE LEVEL  CBC   ____________________________________________   EKG    ____________________________________________    RADIOLOGY  No results found.  ____________________________________________   PROCEDURES Procedures  ____________________________________________   INITIAL IMPRESSION / ASSESSMENT AND PLAN / ED COURSE  Pertinent labs & imaging results that were available during my care of the patient were reviewed by me and considered in my medical decision making (see chart for details).  Patient well appearing no acute distress, no medical complaints only complains of worsening depression symptoms since running out of her medications 3 days ago. Normal vital signs. Medically stable, will get a psychiatry consult. She is voluntary and has capacity to make medical decisions and is not psychotic or a danger to herself or  others at this time.      ____________________________________________   FINAL CLINICAL IMPRESSION(S) / ED DIAGNOSES  Final diagnoses:  None      New Prescriptions   No medications on file     Portions of this note were generated with dragon dictation software. Dictation errors may occur despite best attempts at proofreading.    Sharman Cheek, MD 12/19/16 [phone]

## 2016-12-19 NOTE — ED Notes (Signed)

## 2016-12-19 NOTE — ED Notes (Signed)
BEHAVIORAL HEALTH ROUNDING Patient sleeping: No. Patient alert and oriented: yes Behavior appropriate: Yes.  ; If no, describe:  Nutrition and fluids offered: yes Toileting and hygiene offered: Yes  Sitter present: q15 minute observations and security  monitoring Law enforcement present: Yes  ODS  

## 2016-12-19 NOTE — ED Notes (Signed)
[facility]

## 2016-12-20 ENCOUNTER — Inpatient Hospital Stay
Admission: EM | Admit: 2016-12-20 | Discharge: 2016-12-26 | DRG: 885 | Disposition: A | Discharge status: Home / Self Care | Payer: Medicaid Other | Source: Intra-hospital | Attending: Psychiatry | Admitting: Psychiatry

## 2016-12-20 DIAGNOSIS — G43909 Migraine, unspecified, not intractable, without status migrainosus: Secondary | ICD-10-CM | POA: Diagnosis present

## 2016-12-20 DIAGNOSIS — Z885 Allergy status to narcotic agent status: Secondary | ICD-10-CM | POA: Diagnosis not present

## 2016-12-20 DIAGNOSIS — Z811 Family history of alcohol abuse and dependence: Secondary | ICD-10-CM

## 2016-12-20 DIAGNOSIS — Z818 Family history of other mental and behavioral disorders: Secondary | ICD-10-CM

## 2016-12-20 DIAGNOSIS — F3181 Bipolar II disorder: Principal | ICD-10-CM | POA: Diagnosis present

## 2016-12-20 DIAGNOSIS — F410 Panic disorder [episodic paroxysmal anxiety] without agoraphobia: Secondary | ICD-10-CM | POA: Diagnosis present

## 2016-12-20 DIAGNOSIS — Z87891 Personal history of nicotine dependence: Secondary | ICD-10-CM | POA: Diagnosis not present

## 2016-12-20 DIAGNOSIS — F17200 Nicotine dependence, unspecified, uncomplicated: Secondary | ICD-10-CM | POA: Diagnosis present

## 2016-12-20 DIAGNOSIS — G8929 Other chronic pain: Secondary | ICD-10-CM | POA: Diagnosis present

## 2016-12-20 DIAGNOSIS — Z91012 Allergy to eggs: Secondary | ICD-10-CM

## 2016-12-20 DIAGNOSIS — Z590 Homelessness: Secondary | ICD-10-CM

## 2016-12-20 DIAGNOSIS — F1220 Cannabis dependence, uncomplicated: Secondary | ICD-10-CM | POA: Diagnosis present

## 2016-12-20 DIAGNOSIS — Z96649 Presence of unspecified artificial hip joint: Secondary | ICD-10-CM | POA: Diagnosis present

## 2016-12-20 DIAGNOSIS — R45851 Suicidal ideations: Secondary | ICD-10-CM | POA: Diagnosis present

## 2016-12-20 DIAGNOSIS — F329 Major depressive disorder, single episode, unspecified: Secondary | ICD-10-CM | POA: Diagnosis present

## 2016-12-20 DIAGNOSIS — M25559 Pain in unspecified hip: Secondary | ICD-10-CM | POA: Diagnosis present

## 2016-12-20 DIAGNOSIS — Z888 Allergy status to other drugs, medicaments and biological substances status: Secondary | ICD-10-CM | POA: Diagnosis not present

## 2016-12-20 DIAGNOSIS — Z880 Allergy status to penicillin: Secondary | ICD-10-CM

## 2016-12-20 DIAGNOSIS — Z91040 Latex allergy status: Secondary | ICD-10-CM

## 2016-12-20 DIAGNOSIS — F4310 Post-traumatic stress disorder, unspecified: Secondary | ICD-10-CM | POA: Diagnosis present

## 2016-12-20 DIAGNOSIS — Z79899 Other long term (current) drug therapy: Secondary | ICD-10-CM | POA: Diagnosis not present

## 2016-12-20 DIAGNOSIS — F4010 Social phobia, unspecified: Secondary | ICD-10-CM | POA: Diagnosis present

## 2016-12-20 LAB — GLUCOSE, CAPILLARY
Glucose-Capillary: 110 mg/dL — ABNORMAL HIGH (ref 65–99)
Glucose-Capillary: 87 mg/dL (ref 65–99)

## 2016-12-20 LAB — LIPID PANEL
CHOLESTEROL: 172 mg/dL (ref 0–200)
HDL: 55 mg/dL (ref >40)
LDL CALC: 93 mg/dL (ref 0–99)
TRIGLYCERIDES: 121 mg/dL (ref <150)
Total CHOL/HDL Ratio: 3.1 RATIO
VLDL: 24 mg/dL (ref 0–40)

## 2016-12-20 MED ORDER — BUPROPION HCL ER (XL) 150 MG PO TB24: 300.0000 mg | ORAL_TABLET | Freq: Every day | ORAL | Status: DC

## 2016-12-20 MED ORDER — ACETAMINOPHEN 325 MG PO TABS
650.0000 mg | ORAL_TABLET | Freq: Once | ORAL | Status: AC
  Administered 2016-12-20: 650 mg via ORAL

## 2016-12-20 MED ORDER — GABAPENTIN 400 MG PO CAPS
400.0000 mg | ORAL_CAPSULE | Freq: Three times a day (TID) | ORAL | Status: DC
  Administered 2016-12-20 – 2016-12-26 (×18): 400 mg via ORAL
  Filled 2016-12-20 (×18): qty 1

## 2016-12-20 MED ORDER — LAMOTRIGINE 100 MG PO TABS: 300.0000 mg | ORAL_TABLET | Freq: Every day | ORAL | Status: DC

## 2016-12-20 MED ORDER — QUETIAPINE FUMARATE 25 MG PO TABS: 150.0000 mg | ORAL_TABLET | Freq: Every day | ORAL | Status: DC

## 2016-12-20 MED ORDER — MAGNESIUM HYDROXIDE 400 MG/5ML PO SUSP: 30.0000 mL | Freq: Every day | ORAL | Status: DC | PRN

## 2016-12-20 MED ORDER — ALUM & MAG HYDROXIDE-SIMETH 200-200-20 MG/5ML PO SUSP: 30.0000 mL | ORAL | Status: DC | PRN

## 2016-12-20 MED ORDER — FLUVOXAMINE MALEATE 50 MG PO TABS
50.0000 mg | ORAL_TABLET | Freq: Every day | ORAL | Status: DC
  Administered 2016-12-20: 50 mg via ORAL
  Filled 2016-12-20: qty 1

## 2016-12-20 MED ORDER — LAMOTRIGINE 100 MG PO TABS
200.0000 mg | ORAL_TABLET | Freq: Every day | ORAL | Status: DC
  Administered 2016-12-20 – 2016-12-25 (×6): 200 mg via ORAL
  Filled 2016-12-20 (×7): qty 2

## 2016-12-20 MED ORDER — GABAPENTIN 800 MG PO TABS
400.0000 mg | ORAL_TABLET | Freq: Three times a day (TID) | ORAL | Status: DC
  Filled 2016-12-20: qty 0.5

## 2016-12-20 MED ORDER — NICOTINE 21 MG/24HR TD PT24
21.0000 mg | MEDICATED_PATCH | Freq: Every day | TRANSDERMAL | Status: DC
  Administered 2016-12-21 – 2016-12-24 (×4): 21 mg via TRANSDERMAL
  Filled 2016-12-20 (×4): qty 1

## 2016-12-20 MED ORDER — FLUOXETINE HCL 20 MG PO CAPS: 80.0000 mg | ORAL_CAPSULE | Freq: Every day | ORAL | Status: DC

## 2016-12-20 MED ORDER — NICOTINE 21 MG/24HR TD PT24: 21.0000 mg | MEDICATED_PATCH | Freq: Every day | TRANSDERMAL | Status: DC

## 2016-12-20 MED ORDER — BUPROPION HCL ER (XL) 150 MG PO TB24
150.0000 mg | ORAL_TABLET | Freq: Every day | ORAL | Status: DC
  Administered 2016-12-20 – 2016-12-22 (×3): 150 mg via ORAL
  Filled 2016-12-20 (×3): qty 1

## 2016-12-20 MED ORDER — QUETIAPINE FUMARATE 200 MG PO TABS
300.0000 mg | ORAL_TABLET | Freq: Every day | ORAL | Status: DC
  Administered 2016-12-20 – 2016-12-25 (×6): 300 mg via ORAL
  Filled 2016-12-20 (×6): qty 1

## 2016-12-20 MED ORDER — ACETAMINOPHEN 325 MG PO TABS
ORAL_TABLET | ORAL | Status: AC
  Administered 2016-12-20: 650 mg via ORAL
  Filled 2016-12-20: qty 2

## 2016-12-20 MED ORDER — ACETAMINOPHEN 325 MG PO TABS
650.0000 mg | ORAL_TABLET | Freq: Four times a day (QID) | ORAL | Status: DC | PRN
  Administered 2016-12-20 – 2016-12-26 (×13): 650 mg via ORAL
  Filled 2016-12-20 (×14): qty 2
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
## 2016-12-20 NOTE — Progress Notes (Signed)
Patient is sad and tearful but  cooperative during admission assessment. Patient denies SI/HI at this time. Patient denies AVH. Patient informed of fall risk status, fall risk assessed "low" at this time. Patient oriented to unit/staff/room. Patient denies any questions/concerns at this time. Patient safe on unit with Q15 minute checks for safety.Skin assessment & body search done.No contraband found.Support & encouragement given.

## 2016-12-20 NOTE — ED Provider Notes (Signed)
  Physical Exam  BP 133/79 (BP Location: Right Arm)   Pulse 67   Temp 98 F (36.7 C) (Oral)   Resp 16   Wt 121.1 kg (267 lb)   SpO2 100%   BMI 41.20 kg/m   Physical Exam  ED Course  Procedures  MDM Patient here with suicidal ideation. SOC recommend admission. Medically cleared previously.    Charlynne PanderYao, Tyna Huertas Hsienta, MD 12/20/16 [phone]

## 2016-12-20 NOTE — ED Notes (Signed)
Patient in room resting in no distress. Report called to Inpatient [facility]. Ready for transport

## 2016-12-20 NOTE — BH Assessment (Signed)
Support papers signed and placed on pt's chart.

## 2016-12-20 NOTE — [facility] Group Notes (Signed)
[facility] Group Notes:  (Nursing/MHT/Case Management/Adjunct)  Date:  12/20/2016  Time:  11:07 PM  Type of Therapy:  Group Therapy  Participation Level:  Active  Participation Quality:  Appropriate  Affect:  Appropriate  Cognitive:  Appropriate  Insight:  Appropriate  Engagement in Group:  Engaged  Modes of Intervention:  Discussion  Summary of Progress/Problems:  Tammy EkJanice Marie Jolynne Robles 12/20/2016, 11:07 PM

## 2016-12-20 NOTE — Tx Team (Signed)
Initial Treatment Plan 12/20/2016 2:28 PM Rosalee KaufmanKelly L Trostle ZOX:[id number]    PATIENT STRESSORS: Financial difficulties Traumatic event   PATIENT STRENGTHS: Ability for insight Communication skills Supportive family/friends Work skills   PATIENT IDENTIFIED PROBLEMS: Depression 12/20/2016  Suicidal thoughts 12/20/2016                   DISCHARGE CRITERIA:  Ability to meet basic life and health needs Adequate post-discharge living arrangements Improved stabilization in mood, thinking, and/or behavior Verbal commitment to aftercare and medication compliance  PRELIMINARY DISCHARGE PLAN: Attend aftercare/continuing care group Return to previous living arrangement  PATIENT/FAMILY INVOLVEMENT: This treatment plan has been presented to and reviewed with the patient, Rosalee KaufmanKelly L Schartz, and/or family member,  The patient and family have been given the opportunity to ask questions and make suggestions.  Leonarda SalonGigi George Allegra Cerniglia, RN 12/20/2016, 2:28 PM

## 2016-12-20 NOTE — BH Assessment (Signed)
[facility] by Dr. Ardyth HarpsHernandez.  Attending Physician will be Dr. Ardyth HarpsHernandez.   [facility].   Support papers to be completed. ER staff is aware of the admission ( [facility].;  Marylu LundJanet Patient's Nurse & Vikki PortsValerie Patient Access).  Clinician unable to contact EDP at this time.

## 2016-12-20 NOTE — ED Notes (Signed)
Patient in room. Complaints of generalized discomfort due to sleeping on those beds. Pt states "Im a big girl" reports beds are to uncomfortable and stomach is upset. Pt reports chronic pain at 4. Fluids and nutrition offered. Patient in room watching television in no distress. Pt denies SI, HI, AVh at present.

## 2016-12-20 NOTE — ED Notes (Signed)
Patient in room sitting in chairs, anxious

## 2016-12-20 NOTE — Progress Notes (Signed)
Recreation Therapy Notes  Date: 08.14.18 Time: 1:00 pm Location: Craft Room  Group Topic: Self-expression  Goal Area(s) Addresses:  Patient will be able to identify a color that represents each emotion. Patient will verbalize benefit of using art as a means of self-expression. Patient will verbalize one emotion experienced while participating in activity.  Behavioral Response: Attentive, Interactive  Intervention: The Colors Within Me  Activity: Patients were given a blank face worksheet and were instructed to pick a color for each emotion they were feeling and show on the worksheet how much of that emotion they were feeling.  Education: LRT educated patients on other forms of self-expression.  Education Outcome: Acknowledges education/In group clarification offered  Clinical Observations/Feedback: Patient picked a color for each emotion and showed on the worksheet how much of that emotion she was feeling. Patient contributed to group discussion by stating what emotions she was feeling, how her emotions affect her treatment in the hospital, that her emotions are dynamic, what causes her emotions to change, how she sees her emotions changing once she is d/c, that it was helpful to see her emotions on paper and why, what steps she can take to change her emotions, and what happens when she bottles up her emotions.  [name],Anorah Trias M, LRT/CTRS 12/20/2016 3:57 PM

## 2016-12-20 NOTE — [facility] Suicide Risk Assessment (Signed)
[facility]   Nursing information obtained from:  Patient Demographic factors:  Divorced or widowed, Caucasian, Unemployed Current Mental Status:  NA Loss Factors:  Loss of significant relationship, Financial problems / change in socioeconomic status Historical Factors:  NA Risk Reduction Factors:  NA  Total Time spent with patient: 1 hour Principal Problem: Major depressive disorder, recurrent severe without psychotic features (HCC) Diagnosis:   Patient Active Problem List   Diagnosis Date Noted  . Major depressive disorder, recurrent severe without psychotic features (HCC) [F33.2] 12/20/2016  . Cannabis use disorder, moderate, dependence (HCC) [F12.20] 12/20/2016  . Migraine headache [G43.909] 12/20/2016  . Borderline personality disorder [F60.3] 11/04/2014  . PTSD (post-traumatic stress disorder) [F43.10] 03/29/2004   Subjective Data: suicidal ideation.  Continued Clinical Symptoms:  Alcohol Use Disorder Identification Test Final Score (AUDIT): 0 The "Alcohol Use Disorders Identification Test", Guidelines for Use in Primary Care, Second Edition.  World Science writerHealth Organization [facility](WHO). Score between 0-7:  no or low risk or alcohol related problems. Score between 8-15:  moderate risk of alcohol related problems. Score between 16-19:  high risk of alcohol related problems. Score 20 or above:  warrants further diagnostic evaluation for alcohol dependence and treatment.   CLINICAL FACTORS:   Bipolar Disorder:   Depressive phase Depression:   Impulsivity Insomnia Previous Psychiatric Diagnoses and Treatments   Musculoskeletal: Strength & Muscle Tone: within normal limits Gait & Station: normal Patient leans: N/A  Psychiatric Specialty Exam: Physical Exam  Nursing note and vitals reviewed. Psychiatric: Her speech is normal and behavior is normal. Her mood appears anxious. Cognition and memory are normal. She expresses impulsivity. She exhibits a  depressed mood. She expresses suicidal ideation.    Review of Systems  Neurological: Positive for tingling and headaches.  Psychiatric/Behavioral: Positive for depression and suicidal ideas. The patient is nervous/anxious and has insomnia.   All other systems reviewed and are negative.   Blood pressure 135/75, pulse 69, temperature 98.4 F (36.9 C), temperature source Oral, resp. rate 18, height 5\' 7"  (1.702 m), weight 127 kg (280 lb), SpO2 99 %.Body mass index is 43.85 kg/m.  General Appearance: Casual  Eye Contact:  Good  Speech:  Clear and Coherent  Volume:  Normal  Mood:  Depressed and Hopeless  Affect:  Blunt  Thought Process:  Goal Directed and Descriptions of Associations: Intact  Orientation:  Full (Time, Place, and Person)  Thought Content:  WDL  Suicidal Thoughts:  Yes.  with intent/plan  Homicidal Thoughts:  No  Memory:  Immediate;   Fair Recent;   Fair Remote;   Fair  Judgement:  Impaired  Insight:  Shallow  Psychomotor Activity:  Psychomotor Retardation  Concentration:  Concentration: Fair and Attention Span: Fair  Recall:  [employer]Fair  Fund of Knowledge:  Fair  Language:  Fair  Akathisia:  No  Handed:  Right  AIMS (if indicated):     Assets:  Communication Skills Desire for Improvement Physical Health Resilience Social Support  ADL's:  Intact  Cognition:  WNL  Sleep:         COGNITIVE FEATURES THAT CONTRIBUTE TO RISK:  None    SUICIDE RISK:   Severe:  Frequent, intense, and enduring suicidal ideation, specific plan, no subjective intent, but some objective markers of intent (i.e., choice of lethal method), the method is accessible, some limited preparatory behavior, evidence of impaired self-control, severe dysphoria/symptomatology, multiple risk factors present, and few if any protective factors, particularly a lack of social support.  PLAN OF  CARE: Hospital admission, medication management, discharge planning.  Ms. Tammy Robles is a 46 year old female with  history of depression, anxiety, mood instability admitted for suicidal ideation in the context of severe social stressors.  1. Suicidal ideation. The patient is able to contract for safety in the hospital.  2. Mood. The patient has been maintained on a combination of Seroquel 150 mg, Lamictal 300 mg, Prozac 80 mg, Wellbutrin 300 mg, and Neurontin 400 mg 3 times a day for depression, anxiety, and mood stabilization. We will increase Seroquel to 300 mg, along with Lamictal 200 mg, lowered Wellbutrin 250 mg, and start Luvox tonight for anxiety.  3. Headaches. There is improved with a combination of Seroquel and Neurontin.  4. PTSD. We will try Minipress for flashbacks.  5. Metabolic syndrome monitoring. Lipid panel, TSH, hemoglobin A1c are pending.  6. EKG. Pending.  7. Cannabis abuse. The patient minimizes her problems and declines treatment.  8. Disposition. She will be discharged with her friends. She will follow up with [facility]. She has been established as the patient with charity care at [facility] but it is difficult for her to keep her appointments since she relocated to our area.   I certify that inpatient services furnished can reasonably be expected to improve the patient's condition.   Kristine Linea, MD 12/20/2016, 5:29 PM

## 2016-12-20 NOTE — ED Notes (Signed)
Patient awake asking for tylenol for back pain 4/10. EDP made aware. Verbal order for 650 mg tylenol one time dose.

## 2016-12-20 NOTE — ED Notes (Signed)
Patient currently eating breakfast in no distress

## 2016-12-21 LAB — GLUCOSE, CAPILLARY: Glucose-Capillary: 110 mg/dL — ABNORMAL HIGH (ref 65–99)

## 2016-12-21 MED ORDER — PRAZOSIN HCL 2 MG PO CAPS
2.0000 mg | ORAL_CAPSULE | Freq: Every day | ORAL | Status: DC
  Administered 2016-12-22 – 2016-12-26 (×5): 2 mg via ORAL
  Filled 2016-12-21 (×5): qty 1

## 2016-12-21 MED ORDER — FLUVOXAMINE MALEATE 50 MG PO TABS
100.0000 mg | ORAL_TABLET | Freq: Every day | ORAL | Status: DC
  Administered 2016-12-21 – 2016-12-22 (×2): 100 mg via ORAL
  Filled 2016-12-21 (×2): qty 2

## 2016-12-21 MED ORDER — WHITE PETROLATUM GEL
Status: DC | PRN
  Administered 2016-12-21: 1 via TOPICAL
  Filled 2016-12-21: qty 5

## 2016-12-21 MED ORDER — ONDANSETRON HCL 4 MG PO TABS
4.0000 mg | ORAL_TABLET | Freq: Three times a day (TID) | ORAL | Status: DC | PRN
  Administered 2016-12-21 – 2016-12-23 (×4): 4 mg via ORAL
  Filled 2016-12-21 (×5): qty 1

## 2016-12-21 MED ORDER — PRAZOSIN HCL 2 MG PO CAPS
2.0000 mg | ORAL_CAPSULE | Freq: Once | ORAL | Status: AC
  Administered 2016-12-21: 2 mg via ORAL
  Filled 2016-12-21: qty 1

## 2016-12-21 NOTE — [facility] Group Notes (Signed)
[facility] LCSW Group Therapy   12/21/2016  9:30 am   Type of Therapy: Group Therapy   Participation Level: Patient invited but did not attend.     Hampton AbbotKadijah Gagandeep Pettet, MSW, LCSWA 12/21/2016, 10:53AM

## 2016-12-21 NOTE — Progress Notes (Signed)
[facility] MD Progress Note  12/21/2016 9:27 AM Tammy Robles  MRN:  [id number]  Subjective:   12/21/2016. Tammy Robles met with treatment team today. She is still depressed and has passive suicidal thoughts but slept better last night with Seroquel. She seems to tolerate the Luvox well and we will increase her dose. She complains of nausea and will be given Zofran. Her blood pressure is normal we will try to give 1 dose of Minipress for flashbacks. Good group participation.  Per nursing: Pleasant on approach, EKG obtained by Outgoing RN, A&Ox3, c/o 3/10 Lower back pain, verbalized relief of pain from as previously assessed by Outgoing RN; appearance well groomed, allowed to vent, affect improved as she started to proudly talk about her 6 yo son who is a Government social research officer with his employer. Denied SI/HI/AVH.  Principal Problem: Bipolar 2 disorder ([place]) Diagnosis:   Patient Active Problem List   Diagnosis Date Noted  . Bipolar 2 disorder ([place]) [F31.81] 12/20/2016  . Cannabis use disorder, moderate, dependence ([place]) [F12.20] 12/20/2016  . Migraine headache [G43.909] 12/20/2016  . Borderline personality disorder [F60.3] 11/04/2014  . PTSD (post-traumatic stress disorder) [F43.10] 03/29/2004   Total Time spent with patient: 30 minutes  Past Psychiatric History: depression, anxiety, mood instability.  Past Medical History:  Past Medical History:  Diagnosis Date  . Bladder prolapse, female, acquired   . Endometriosis 1996  . Endometritis 1990   fever 105 post delivery  . Polycystic ovarian disease 1993    Past Surgical History:  Procedure Laterality Date  . ABDOMINAL HYSTERECTOMY    . 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 HIP ARTHROPLASTY    . hysterrectomy  1996  . LAPAROTOMY     multiple for cysts  . LEFT OOPHORECTOMY  2007  . mass off appendix    . RIGHT OOPHORECTOMY    . WRIST SURGERY  2007 and 2003   both tendon release   Family History:   Family History  Problem Relation Age of Onset  . Stroke Mother   . Cancer Father    Family Psychiatric  History: depression, bipolar, completed suicide in grandfather. Social History:  History  Alcohol Use No     History  Drug Use No    Social History   Social History  . Marital status: Single    Spouse name: N/A  . Number of children: N/A  . Years of education: N/A   Social History Main Topics  . Smoking status: Former Smoker    Packs/day: 0.50  . Smokeless tobacco: Never Used  . Alcohol use No  . Drug use: No  . Sexual activity: Not Asked   Other Topics Concern  . None   Social History Narrative  . None   Additional Social History:                         Sleep: Fair  Appetite:  Fair  Current Medications: Current Facility-Administered Medications  Medication Dose Route Frequency Provider Last Rate Last Dose  . acetaminophen (TYLENOL) tablet 650 mg  650 mg Oral Q6H PRN Hildred Priest, MD   650 mg at 12/21/16 0524  . alum & mag hydroxide-simeth (MAALOX/MYLANTA) 200-200-20 MG/5ML suspension 30 mL  30 mL Oral Q4H PRN Hildred Priest, MD      . buPROPion (WELLBUTRIN XL) 24 hr tablet 150 mg  150 mg Oral Daily Emmani Lesueur B, MD  150 mg at 12/20/16 2116  . fluvoxaMINE (LUVOX) tablet 50 mg  50 mg Oral QHS Lexxus Underhill B, MD   50 mg at 12/20/16 2117  . gabapentin (NEURONTIN) capsule 400 mg  400 mg Oral TID Candelaria Stagers, RPH   400 mg at 12/21/16 6962  . lamoTRIgine (LAMICTAL) tablet 200 mg  200 mg Oral QHS Paisely Brick B, MD   200 mg at 12/20/16 2117  . magnesium hydroxide (MILK OF MAGNESIA) suspension 30 mL  30 mL Oral Daily PRN Hildred Priest, MD      . nicotine (NICODERM CQ - dosed in mg/24 hours) patch 21 mg  21 mg Transdermal Daily Hildred Priest, MD   21 mg at 12/21/16 9528  . QUEtiapine (SEROQUEL) tablet 300 mg  300 mg Oral QHS Oluwadamilola Deliz B, MD   300 mg at 12/20/16 2117     Lab Results:  Results for orders placed or performed during the hospital encounter of 12/20/16 (from the past 48 hour(s))  Glucose, capillary     Status: None   Collection Time: 12/20/16  8:09 PM  Result Value Ref Range   Glucose-Capillary 87 65 - 99 mg/dL  Glucose, capillary     Status: Abnormal   Collection Time: 12/21/16  6:57 AM  Result Value Ref Range   Glucose-Capillary 110 (H) 65 - 99 mg/dL    Blood Alcohol level:  Lab Results  Component Value Date   ETH <5 41/32/4401    Metabolic Disorder Labs: No results found for: HGBA1C, MPG No results found for: PROLACTIN Lab Results  Component Value Date   CHOL 172 12/20/2016   TRIG 121 12/20/2016   HDL 55 12/20/2016   CHOLHDL 3.1 12/20/2016   VLDL 24 12/20/2016   LDLCALC 93 12/20/2016    Physical Findings: AIMS:  , ,  ,  ,    CIWA:    COWS:     Musculoskeletal: Strength & Muscle Tone: within normal limits Gait & Station: normal Patient leans: N/A  Psychiatric Specialty Exam: Physical Exam  Nursing note and vitals reviewed. Psychiatric: Her speech is normal and behavior is normal. Cognition and memory are normal. She expresses impulsivity. She exhibits a depressed mood. She expresses suicidal ideation.    Review of Systems  Neurological: Positive for tingling and headaches.  Psychiatric/Behavioral: Positive for depression and suicidal ideas. The patient is nervous/anxious and has insomnia.   All other systems reviewed and are negative.   Blood pressure 128/70, pulse 63, temperature 98.5 F (36.9 C), temperature source Oral, resp. rate 18, height _0  (1.702 m), weight 127 kg (280 lb), SpO2 99 %.Body mass index is 43.85 kg/m.  General Appearance: Casual  Eye Contact:  Good  Speech:  Clear and Coherent  Volume:  Normal  Mood:  Anxious, Depressed and Hopeless  Affect:  Congruent  Thought Process:  Goal Directed and Descriptions of Associations: Intact  Orientation:  Full (Time, Place, and Person)  Thought  Content:  WDL  Suicidal Thoughts:  Yes.  without intent/plan  Homicidal Thoughts:  No  Memory:  Immediate;   Fair Recent;   Fair Remote;   Fair  Judgement:  Impaired  Insight:  Shallow  Psychomotor Activity:  Decreased  Concentration:  Concentration: Fair and Attention Span: Fair  Recall:  [employer] of Knowledge:  Fair  Language:  Fair  Akathisia:  No  Handed:  Right  AIMS (if indicated):     Assets:  Communication Skills Desire for Improvement Physical Health Resilience Social  Support  ADL's:  Intact  Cognition:  WNL  Sleep:  Number of Hours: 6     Treatment Plan Summary: Daily contact with patient to assess and evaluate symptoms and progress in treatment and Medication management   Tammy Robles is a 46 year old female with history of depression, anxiety, mood instability admitted for suicidal ideation in the context of severe social stressors.  1. Suicidal ideation. The patient is able to contract for safety in the hospital.  2. Mood. The patient has been maintained on a combination of Seroquel 150 mg, Lamictal 300 mg, Prozac 80 mg, Wellbutrin 300 mg, and Neurontin 400 mg 3 times a day for depression, anxiety, and mood stabilization. We will increase Seroquel to 300 mg, along with Lamictal 200 mg, lowered Wellbutrin 250 mg, and start Luvox tonight for anxiety. We increase Luvox to 100 mg.  3. Headaches. There is improved with a combination of Seroquel and Neurontin.  4. PTSD. We start Minipress for flashbacks.  5. Metabolic syndrome monitoring. Lipid panel and TSH are normal, hemoglobin A1c pending.  6. EKG. Pending.  7. Cannabis abuse. The patient minimizes her problems and declines treatment.  8. Nausea. Zofran.  9. Disposition. She will be discharged with her friends. She will follow up with [facility]. She has been established as the patient with charity care at [facility] but it is difficult for her to keep her appointments since she relocated to our area.   Orson Slick, MD 12/21/2016, 9:27 AM

## 2016-12-21 NOTE — Progress Notes (Signed)
D:  Patient noted cheerful affect this am Patient stated slept fair last night .Stated appetite fair and energy level  Is normal. Stated concentration is good . Stated on Depression scale 4, hopeless 5 and anxiety 5 .( low 0-10 high) Denies suicidal  homicidal ideations  .  No auditory hallucinations  No pain concerns . Appropriate ADL'S. Interacting with peers and staff.  A: Encourage patient participation with unit programming . Instruction  Given on  Medication , verbalize understanding. R: Voice no other concerns. Staff continue to monitor

## 2016-12-21 NOTE — Progress Notes (Signed)
Recreation Therapy Notes  Date: 08.15.18 Time: 1:00 pm Location: Craft Room  Group Topic: Self-esteem  Goal Area(s) Addresses:  Patient will be able to identify benefit of self-esteem. Patient will be able to identify ways to increase self-esteem.  Behavioral Response: Attentive, Interactive  Intervention: Self Portrait  Activity: Patients were given blank face worksheets and were instructed to draw their self-portrait of how they felt. Patients were given construction paper and were instructed to write a positive trait about themselves and positive traits about peers. After patients read positive comments, patients were given blank face worksheets and were instructed to draw their self-portraits of how they felt.  Education: LRT educated patients on ways they can increase their self-esteem.  Education Outcome: Acknowledges education/In group clarification offered  Clinical Observations/Feedback: Patient drew both self-portraits. Patient wrote positive trait about self and peers. Patient contributed to group discussion by stating how the two faces were different, how she felt after reading positive comments from peers, that it was difficult to believe what her peers said, and how she can increase her self-esteem.  [name],Nichola Warren M, LRT/CTRS 12/21/2016 1:49 PM

## 2016-12-21 NOTE — [facility] Group Notes (Signed)
[facility] Group Notes:  (Nursing/MHT/Case Management/Adjunct)  Date:  12/21/2016  Time:  10:13 PM  Type of Therapy:  Group Therapy  Participation Level:  Active  Participation Quality:  Appropriate  Affect:  Appropriate  Cognitive:  Alert  Insight:  Good  Engagement in Group:  Engaged  Modes of Intervention:  Activity  Summary of Progress/Problems:  Mayra NeerJackie L Creedence Kunesh 12/21/2016, 10:13 PM

## 2016-12-21 NOTE — Progress Notes (Signed)
Recreation Therapy Notes  INPATIENT RECREATION THERAPY ASSESSMENT  Patient Details Name: Tammy Robles MRN: [id number] DOB: 09-01-1970 Today's Date: 12/21/2016  Patient Stressors: Family, Death, Other (Comment) (Not good relationship with the family she has left; dealing with mom's death; wanting to get her life together; roommates have prescriptions they are abusing; no transportation)  Coping Skills:   Arguments, Substance Abuse, Avoidance, Exercise, Art/Dance, Talking, Music, Sports  Personal Challenges: Anger, Communication, Concentration, Decision-Making, Problem-Solving, Relationships, Self-Esteem/Confidence, Social Interaction, Stress Management, Trusting Others  Leisure Interests (2+):  Individual - Other (Comment) (Riding four wheelers with son)  Awareness of Community Resources:  Yes  Community Resources:  Engineering geologistLibrary, Other (Comment) Naval architect(Museum)  Current Use: No  If no, Barriers?: Transportation  Patient Strengths:  Caring, helpful  Patient Identified Areas of Improvement:  Continuing with weight loss, more communication  Current Recreation Participation:  Taking care of dogs  Patient Goal for Hospitalization:  Getting coping skills, go to groups, and get medications  [place]  [place]   Current SI (including self-harm):  No  Current HI:  No  Consent to Intern Participation: N/A   Tammy Robles,Tammy Robles, LRT/CTRS 12/21/2016, 4:12 PM

## 2016-12-21 NOTE — Tx Team (Signed)
Interdisciplinary Treatment and Diagnostic Plan Update  12/21/2016 Time of Session: [place] MRN: [id number]  Principal Diagnosis: Bipolar 2 disorder [facility])  Secondary Diagnoses: Principal Problem:   Bipolar 2 disorder ([place]) Active Problems:   PTSD (post-traumatic stress disorder)   Cannabis use disorder, moderate, dependence (HCC)   Migraine headache   Current Medications:  Current Facility-Administered Medications  Medication Dose Route Frequency Provider Last Rate Last Dose  . acetaminophen (TYLENOL) tablet 650 mg  650 mg Oral Q6H PRN Hildred Priest, MD   650 mg at 12/21/16 0524  . alum & mag hydroxide-simeth (MAALOX/MYLANTA) 200-200-20 MG/5ML suspension 30 mL  30 mL Oral Q4H PRN Hildred Priest, MD      . buPROPion (WELLBUTRIN XL) 24 hr tablet 150 mg  150 mg Oral Daily Pucilowska, Jolanta B, MD   150 mg at 12/20/16 2116  . fluvoxaMINE (LUVOX) tablet 100 mg  100 mg Oral QHS Pucilowska, Jolanta B, MD      . gabapentin (NEURONTIN) capsule 400 mg  400 mg Oral TID Candelaria Stagers, RPH   400 mg at 12/21/16 6979  . lamoTRIgine (LAMICTAL) tablet 200 mg  200 mg Oral QHS Pucilowska, Jolanta B, MD   200 mg at 12/20/16 2117  . magnesium hydroxide (MILK OF MAGNESIA) suspension 30 mL  30 mL Oral Daily PRN Hildred Priest, MD      . nicotine (NICODERM CQ - dosed in mg/24 hours) patch 21 mg  21 mg Transdermal Daily Hildred Priest, MD   21 mg at 12/21/16 4801  . prazosin (MINIPRESS) capsule 2 mg  2 mg Oral Once Pucilowska, Jolanta B, MD      . QUEtiapine (SEROQUEL) tablet 300 mg  300 mg Oral QHS Pucilowska, Jolanta B, MD   300 mg at 12/20/16 2117   PTA Medications: Prescriptions Prior to Admission  Medication Sig Dispense Refill Last Dose  . buPROPion (WELLBUTRIN XL) 300 MG 24 hr tablet Take 300 mg by mouth daily.   12/17/2016  . clonazePAM (KLONOPIN) 0.5 MG tablet Take 0.5 mg by mouth 2 (two) times daily as needed for anxiety.   12/17/2016   . doxepin (SINEQUAN) 25 MG capsule Take 25 mg by mouth at bedtime.   More than a month at Unknown time  . FLUoxetine (PROZAC) 20 MG tablet Take 80 mg by mouth daily.    12/17/2016  . gabapentin (NEURONTIN) 400 MG capsule Take 400 mg by mouth 3 (three) times daily.   12/17/2016  . lamoTRIgine (LAMICTAL) 200 MG tablet Take 300 mg by mouth at bedtime.    12/17/2016  . Lurasidone HCl 60 MG TABS Take 30 mg by mouth every evening.   10/05/2014 at Unknown time  . QUEtiapine (SEROQUEL) 100 MG tablet Take 150 mg by mouth at bedtime.   12/17/2016  . QUEtiapine (SEROQUEL) 50 MG tablet Take 50 mg by mouth 2 (two) times daily as needed.   12/17/2016  . traMADol (ULTRAM) 50 MG tablet Take 1 tablet (50 mg total) by mouth every 6 (six) hours as needed. 15 tablet 0   . traMADol (ULTRAM) 50 MG tablet Take 1 tablet (50 mg total) by mouth every 6 (six) hours as needed. 12 tablet 0     Patient Stressors: Financial difficulties Traumatic event  Patient Strengths: Ability for insight Communication skills Supportive family/friends Work skills  Treatment Modalities: Medication Management, Group therapy, Case management,  1 to 1 session with clinician, Psychoeducation, Recreational therapy.   Physician Treatment Plan for Primary Diagnosis: Bipolar 2 disorder (  HCC) Long Term Goal(s): Improvement in symptoms so as ready for discharge Improvement in symptoms so as ready for discharge   Short Term Goals: Ability to identify changes in lifestyle to reduce recurrence of condition will improve Ability to verbalize feelings will improve Ability to disclose and discuss suicidal ideas Ability to demonstrate self-control will improve Ability to identify and develop effective coping behaviors will improve Ability to maintain clinical measurements within normal limits will improve Ability to identify triggers associated with substance abuse/mental health issues will improve Ability to identify changes in lifestyle to reduce  recurrence of condition will improve Ability to demonstrate self-control will improve Ability to identify triggers associated with substance abuse/mental health issues will improve  Medication Management: Evaluate patient's response, side effects, and tolerance of medication regimen.  Therapeutic Interventions: 1 to 1 sessions, Unit Group sessions and Medication administration.  Evaluation of Outcomes: Not Met  Physician Treatment Plan for Secondary Diagnosis: Principal Problem:   Bipolar 2 disorder (HCC) Active Problems:   PTSD (post-traumatic stress disorder)   Cannabis use disorder, moderate, dependence (HCC)   Migraine headache  Long Term Goal(s): Improvement in symptoms so as ready for discharge Improvement in symptoms so as ready for discharge   Short Term Goals: Ability to identify changes in lifestyle to reduce recurrence of condition will improve Ability to verbalize feelings will improve Ability to disclose and discuss suicidal ideas Ability to demonstrate self-control will improve Ability to identify and develop effective coping behaviors will improve Ability to maintain clinical measurements within normal limits will improve Ability to identify triggers associated with substance abuse/mental health issues will improve Ability to identify changes in lifestyle to reduce recurrence of condition will improve Ability to demonstrate self-control will improve Ability to identify triggers associated with substance abuse/mental health issues will improve     Medication Management: Evaluate patient's response, side effects, and tolerance of medication regimen.  Therapeutic Interventions: 1 to 1 sessions, Unit Group sessions and Medication administration.  Evaluation of Outcomes: Not Met   RN Treatment Plan for Primary Diagnosis: Bipolar 2 disorder (HCC) Long Term Goal(s): Knowledge of disease and therapeutic regimen to maintain health will improve  Short Term Goals: Ability  to verbalize feelings will improve, Ability to identify and develop effective coping behaviors will improve and Compliance with prescribed medications will improve  Medication Management: RN will administer medications as ordered by provider, will assess and evaluate patient's response and provide education to patient for prescribed medication. RN will report any adverse and/or side effects to prescribing provider.  Therapeutic Interventions: 1 on 1 counseling sessions, Psychoeducation, Medication administration, Evaluate responses to treatment, Monitor vital signs and CBGs as ordered, Perform/monitor CIWA, COWS, AIMS and Fall Risk screenings as ordered, Perform wound care treatments as ordered.  Evaluation of Outcomes: Not Met   LCSW Treatment Plan for Primary Diagnosis: Bipolar 2 disorder (HCC) Long Term Goal(s): Safe transition to appropriate next level of care at discharge, Engage patient in therapeutic group addressing interpersonal concerns.  Short Term Goals: Engage patient in aftercare planning with referrals and resources, Increase social support and Increase skills for wellness and recovery  Therapeutic Interventions: Assess for all discharge needs, 1 to 1 time with Social worker, Explore available resources and support systems, Assess for adequacy in community support network, Educate family and significant other(s) on suicide prevention, Complete Psychosocial Assessment, Interpersonal group therapy.  Evaluation of Outcomes: Not Met   Progress in Treatment: Attending groups: Yes. Participating in groups: Yes. Taking medication as prescribed: Yes. Toleration medication:  Yes. Family/Significant other contact made: No, will contact:  when given permission Patient understands diagnosis: Yes. Discussing patient identified problems/goals with staff: Yes. Medical problems stabilized or resolved: Yes. Denies suicidal/homicidal ideation: Yes. Issues/concerns per patient self-inventory:  No. Other: none  New problem(s) identified: No, Describe:  none  New Short Term/Long Term Goal(s): Pt goal: "to get the tools that I need to get better."  Discharge Plan or Barriers: Pt will begin outpt services with [facility].  Reason for Continuation of Hospitalization: Depression Medication stabilization  Estimated Length of Stay: 3-5 days.  Attendees: Patient: Tammy Robles 12/21/2016   Physician: Dr. Orson Slick, MD 12/21/2016   Nursing: Polly Cobia, RN 12/21/2016   RN Care Manager: 12/21/2016   Social Worker: Lurline Idol MSW, LCSW 12/21/2016   Recreational Therapist: Leonette Monarch, LRT/CTRS 12/21/2016   Other: Drue Dun, chaplain 12/21/2016   Other:  12/21/2016   Other: 12/21/2016        Scribe for Treatment Team: Joanne Chars, [place] 12/21/2016 11:43 AM

## 2016-12-21 NOTE — Plan of Care (Signed)
Problem: Activity: Goal: Sleeping patterns will improve Outcome: Progressing Patient slept for Estimated Hours of 6; Precautionary checks every 15 minutes for safety maintained, room free of safety hazards, patient sustains no injury or falls during this shift.    

## 2016-12-21 NOTE — Progress Notes (Signed)
Patient ID: Rosalee KaufmanKelly L Reier, female   DOB: [birth year], 46 y.o.   MRN: [id number] Pleasant on approach, EKG obtained by Outgoing RN, A&Ox3, c/o 3/10 Lower back pain, verbalized relief of pain from as previously assessed by Outgoing RN; appearance well groomed, allowed to vent, affect improved as she started to proudly talk about her 47 yo son who is a Emergency planning/management officerproject manager with his employer. Denied SI/HI/AVH.
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
## 2016-12-21 NOTE — Plan of Care (Signed)
Problem: Activity: Goal: Interest or engagement in activities will improve Outcome: Progressing  Patient attending unit programing  Goal: Sleeping patterns will improve Outcome: Progressing Voice no concerns around  Sleep.   Problem: Education: Goal: Knowledge of [place] [facility] Education information/materials will improve Outcome: Progressing Staff instructed  On information  About unit  Voice no concerns  Goal: Emotional status will improve Outcome: Progressing No crying  Or yelling this shift  Goal: Mental status will improve Outcome: Progressing Patient calm  And patience . Goal: Verbalization of understanding the information provided will improve Outcome: Progressing Verbaliz understanding of information given  Problem: Coping: Goal: Ability to verbalize frustrations and anger appropriately will improve Outcome: Progressing Attending unit programing  Goal: Ability to demonstrate self-control will improve Working on coping  skills   Problem: Health Behavior/Discharge Planning: Goal: Identification of resources available to assist in meeting health care needs will improve Outcome: Progressing Attended Treatment Team   Problem: Safety: Goal: Periods of time without injury will increase Outcome: Progressing No injury this shift

## 2016-12-22 LAB — HEMOGLOBIN A1C
Hgb A1c MFr Bld: 5.3 % (ref 4.8–5.6)
Mean Plasma Glucose: 105.41 mg/dL

## 2016-12-22 MED ORDER — SUMATRIPTAN SUCCINATE 50 MG PO TABS
50.0000 mg | ORAL_TABLET | ORAL | Status: DC | PRN
  Administered 2016-12-22 (×2): 50 mg via ORAL
  Filled 2016-12-22 (×3): qty 1

## 2016-12-22 NOTE — Plan of Care (Signed)
Problem: Safety: Goal: Periods of time without injury will increase Outcome: Progressing Pt remains safe while in hospital injury free.    

## 2016-12-22 NOTE — Progress Notes (Signed)
Pt calm and cooperative with staff. She is isolative to room she complains of migraine headache. Pt was given Imitrex twice this shift. He denies SI, HI, a/v hallucinations. However, pt is reporting depressed mood. Will continue to monitor.

## 2016-12-22 NOTE — [facility] Group Notes (Signed)
[facility] Group Notes:  (Nursing/MHT/Case Management/Adjunct)  Date:  12/22/2016  Time:  4:02 PM  Type of Therapy:  Psychoeducational Skills  Participation Level:  Active  Participation Quality:  Appropriate, Attentive, Sharing and Supportive  Affect:  Appropriate  Cognitive:  Appropriate  Insight:  Good  Engagement in Group:  Engaged and Supportive  Modes of Intervention:  Discussion and Education  Summary of Progress/Problems:  Tammy Robles Tammy Robles 12/22/2016, 4:02 PM

## 2016-12-22 NOTE — [facility] Group Notes (Signed)
[facility] LCSW Group Therapy Note  Date/Time: 12/22/16, 0930  Type of Therapy/Topic:  Group Therapy:  Balance in Life  Participation Level:  Did not attend  Description of Group:    This group will address the concept of balance and how it feels and looks when one is unbalanced. Patients will be encouraged to process areas in their lives that are out of balance, and identify reasons for remaining unbalanced. Facilitators will guide patients utilizing problem- solving interventions to address and correct the stressor making their life unbalanced. Understanding and applying boundaries will be explored and addressed for obtaining  and maintaining a balanced life. Patients will be encouraged to explore ways to assertively make their unbalanced needs known to significant others in their lives, using other group members and facilitator for support and feedback.  Therapeutic Goals: 1. Patient will identify two or more emotions or situations they have that consume much of in their lives. 2. Patient will identify signs/triggers that life has become out of balance:  3. Patient will identify two ways to set boundaries in order to achieve balance in their lives:  4. Patient will demonstrate ability to communicate their needs through discussion and/or role plays  Summary of Patient Progress:          Therapeutic Modalities:   Cognitive Behavioral Therapy Solution-Focused Therapy Assertiveness Training  Greg Lutricia Widjaja, LCSW 

## 2016-12-22 NOTE — Plan of Care (Signed)
Problem: Activity: Goal: Sleeping patterns will improve Outcome: Progressing Patient slept for Estimated Hours of 7; Precautionary checks every 15 minutes for safety maintained, room free of safety hazards, patient sustains no injury or falls during this shift.    

## 2016-12-22 NOTE — Progress Notes (Signed)
Recreation Therapy Notes  Date: 08.16.18 Time: 1:00 pm Location: Craft Room  Group Topic: Leisure Education  Goal Area(s) Addresses:  Patient will identify healthy leisure activities. Patient will identify positive emotions.  Behavioral Response: Did not attend  Intervention: Leisure Time  Activity: Patients were given 2 pieces of paper and were instructed to write healthy leisure activities on each piece. Patients were given a Leisure Time Clock worksheet to complete. Patients were instructed as a group to come up with 10 positive emotions. Patients were instructed to match the activities to the emotions they listed.  Education: LRT educated patients on what they need to participate in leisure.  Education Outcome: Patient did not attend group.  Clinical Observations/Feedback: Patient did not attend group.  Mel Langan M, LRT/CTRS 12/22/2016 2:06 PM 

## 2016-12-22 NOTE — [facility] Suicide Risk Assessment (Signed)
[facility] INPATIENT:  Family/Significant Other Suicide Prevention Education  Suicide Prevention Education:  Contact Attempts: Tammy HeysBetty Robles, friend, [phone], has been identified by the patient as the family member/significant other with whom the patient will be residing, and identified as the person(s) who will aid the patient in the event of a mental health crisis.  With written consent from the patient, two attempts were made to provide suicide prevention education, prior to and/or following the patient's discharge.  We were unsuccessful in providing suicide prevention education.  A suicide education pamphlet was given to the patient to share with family/significant other.  Date and time of first attempt:12/22/16, 1416 Date and time of second attempt:  [name], [name], LCSW 12/22/2016, 2:17 PM
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
## 2016-12-22 NOTE — Progress Notes (Deleted)
Recreation Therapy Notes  Date: 08.16.18 Time: 9:30 am Location: Craft Room  Group Topic: Leisure Education  Goal Area(s) Addresses:  Patient will identify things they are grateful for. Patient will identify how being grateful can influence decision making.  Behavioral Response: Did not attend  Intervention: Grateful Wheel  Activity: Patients were given an I Am Grateful For worksheet and were instructed to write things they are grateful for under each category.  Education: LRT educated patients on leisure.  Education Outcome: Patient did not attend group.   Clinical Observations/Feedback: Patient did not attend group.  [name],Kayleb Warshaw M, LRT/CTRS 12/22/2016 1:54 PM

## 2016-12-23 DIAGNOSIS — F17200 Nicotine dependence, unspecified, uncomplicated: Secondary | ICD-10-CM | POA: Diagnosis present

## 2016-12-23 MED ORDER — HYDROXYZINE HCL 25 MG PO TABS
25.0000 mg | ORAL_TABLET | Freq: Three times a day (TID) | ORAL | Status: DC | PRN
  Administered 2016-12-23 – 2016-12-26 (×6): 25 mg via ORAL
  Filled 2016-12-23 (×6): qty 1

## 2016-12-23 MED ORDER — FLUVOXAMINE MALEATE 50 MG PO TABS
150.0000 mg | ORAL_TABLET | Freq: Every day | ORAL | Status: DC
  Administered 2016-12-23 – 2016-12-25 (×3): 150 mg via ORAL
  Filled 2016-12-23 (×3): qty 3

## 2016-12-23 MED ORDER — PROCHLORPERAZINE MALEATE 10 MG PO TABS
10.0000 mg | ORAL_TABLET | Freq: Four times a day (QID) | ORAL | Status: DC | PRN
  Administered 2016-12-23 – 2016-12-25 (×5): 10 mg via ORAL
  Filled 2016-12-23 (×12): qty 1

## 2016-12-23 NOTE — Progress Notes (Addendum)
[facility] MD Progress Note  12/23/2016 4:40 PM Tammy Robles  MRN:  [id number]  Subjective:   Tammy Robles is a 46 year old female with a history of depression, anxiety and mood instability admitted for suicidal ideation in the context of social stressors. She has been a patient at [facility] prescribed multiple medications.   12/21/2016. Tammy Robles met with treatment team today. She is still depressed and has passive suicidal thoughts but slept better last night with Seroquel. She seems to tolerate the Luvox well and we will increase her dose. She complains of nausea and will be given Zofran. Her blood pressure is normal we will try to give 1 dose of Minipress for flashbacks. Good group participation.  12/22/2016. Tammy Robles feels better. She engages easily and is rather talkative on the interview but always talks about other people in her circle, unwilling to address her own problems. She accepts medications and tolerates them well. Nausea has resolved but she developed migraine headache this morning preventing her from group participation. We will give Imitrex.  12/23/2016. Tammy Robles continues to improve but still feels sad with flat affect. She is very somatic and possibly drug seeking. Complains of "pain" and nausea. She is on Zofran but asking for phenergan. We switch to Compazine and add Vistaril. She confirms that she can stay with friends. Anticipated discharge on Monday.  Per nursing: It was too soon when patient requested for Tylenol and Zofran; affect and mood slightly flat and sad, denied SI/HI/AVH, visible and interacting well with peers.  Principal Problem: Bipolar 2 disorder ([place]) Diagnosis:   Patient Active Problem List   Diagnosis Date Noted  . Bipolar 2 disorder ([place]) [F31.81] 12/20/2016  . Cannabis use disorder, moderate, dependence ([place]) [F12.20] 12/20/2016  . Migraine headache [G43.909] 12/20/2016  . Borderline personality disorder [F60.3] 11/04/2014  . PTSD (post-traumatic stress  disorder) [F43.10] 03/29/2004   Total Time spent with patient: 30 minutes  Past Psychiatric History: depression, anxiety, mood instability.  Past Medical History:  Past Medical History:  Diagnosis Date  . Bladder prolapse, female, acquired   . Endometriosis 1996  . Endometritis 1990   fever 105 post delivery  . Polycystic ovarian disease 1993    Past Surgical History:  Procedure Laterality Date  . ABDOMINAL HYSTERECTOMY    . 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 HIP ARTHROPLASTY    . hysterrectomy  1996  . LAPAROTOMY     multiple for cysts  . LEFT OOPHORECTOMY  2007  . mass off appendix    . RIGHT OOPHORECTOMY    . WRIST SURGERY  2007 and 2003   both tendon release   Family History:  Family History  Problem Relation Age of Onset  . Stroke Mother   . Cancer Father    Family Psychiatric  History: depression, bipolar, completed suicide in grandfather. Social History:  History  Alcohol Use No     History  Drug Use No    Social History   Social History  . Marital status: Single    Spouse name: N/A  . Number of children: N/A  . Years of education: N/A   Social History Main Topics  . Smoking status: Former Smoker    Packs/day: 0.50  . Smokeless tobacco: Never Used  . Alcohol use No  . Drug use: No  . Sexual activity: Not Asked   Other Topics Concern  . None   Social History Narrative  . None  Additional Social History:                         Sleep: Fair  Appetite:  Fair  Current Medications: Current Facility-Administered Medications  Medication Dose Route Frequency Provider Last Rate Last Dose  . acetaminophen (TYLENOL) tablet 650 mg  650 mg Oral Q6H PRN Hildred Priest, MD   650 mg at 12/23/16 0557  . alum & mag hydroxide-simeth (MAALOX/MYLANTA) 200-200-20 MG/5ML suspension 30 mL  30 mL Oral Q4H PRN Hildred Priest, MD      . buPROPion (WELLBUTRIN XL) 24 hr tablet 150 mg   150 mg Oral Daily Robertt Buda B, MD   150 mg at 12/22/16 2118  . fluvoxaMINE (LUVOX) tablet 150 mg  150 mg Oral QHS Deavon Podgorski B, MD      . gabapentin (NEURONTIN) capsule 400 mg  400 mg Oral TID Candelaria Stagers, RPH   400 mg at 12/23/16 1218  . hydrOXYzine (ATARAX/VISTARIL) tablet 25 mg  25 mg Oral TID PRN Shadae Reino B, MD   25 mg at 12/23/16 1218  . lamoTRIgine (LAMICTAL) tablet 200 mg  200 mg Oral QHS Bayleigh Loflin B, MD   200 mg at 12/22/16 2118  . magnesium hydroxide (MILK OF MAGNESIA) suspension 30 mL  30 mL Oral Daily PRN Hildred Priest, MD      . nicotine (NICODERM CQ - dosed in mg/24 hours) patch 21 mg  21 mg Transdermal Daily Hildred Priest, MD   21 mg at 12/23/16 0300  . prazosin (MINIPRESS) capsule 2 mg  2 mg Oral Q breakfast Chasity Outten B, MD   2 mg at 12/23/16 0812  . prochlorperazine (COMPAZINE) tablet 10 mg  10 mg Oral Q6H PRN Kabrea Seeney B, MD      . QUEtiapine (SEROQUEL) tablet 300 mg  300 mg Oral QHS Kindra Bickham B, MD   300 mg at 12/22/16 2118  . SUMAtriptan (IMITREX) tablet 50 mg  50 mg Oral Q2H PRN Lauralee Waters B, MD   50 mg at 12/22/16 1457  . white petrolatum (VASELINE) gel   Topical PRN Maryiah Olvey B, MD   1 application at 92/33/00 1441    Lab Results:  Results for orders placed or performed during the hospital encounter of 12/20/16 (from the past 48 hour(s))  Hemoglobin A1c     Status: None   Collection Time: 12/22/16  9:05 AM  Result Value Ref Range   Hgb A1c MFr Bld 5.3 4.8 - 5.6 %    Comment: (NOTE) Pre diabetes:          5.7%-6.4% Diabetes:              >6.4% Glycemic control for   <7.0% adults with diabetes    Mean Plasma Glucose 105.41 mg/dL    Comment: Performed at [facility], [place] [address], [place], [place] [address]    Blood Alcohol level:  Lab Results  Component Value Date   ETH <5 33/35/4562    Metabolic Disorder Labs: Lab Results   Component Value Date   HGBA1C 5.3 12/22/2016   MPG 105.41 12/22/2016   No results found for: PROLACTIN Lab Results  Component Value Date   CHOL 172 12/20/2016   TRIG 121 12/20/2016   HDL 55 12/20/2016   CHOLHDL 3.1 12/20/2016   VLDL 24 12/20/2016   LDLCALC 93 12/20/2016    Physical Findings: AIMS:  , ,  ,  ,    CIWA:  COWS:     Musculoskeletal: Strength & Muscle Tone: within normal limits Gait & Station: normal Patient leans: N/A  Psychiatric Specialty Exam: Physical Exam  Nursing note and vitals reviewed. Psychiatric: Her speech is normal and behavior is normal. Cognition and memory are normal. She expresses impulsivity. She exhibits a depressed mood. She expresses suicidal ideation.    Review of Systems  Neurological: Positive for tingling and headaches.  Psychiatric/Behavioral: Positive for depression and suicidal ideas. The patient is nervous/anxious and has insomnia.   All other systems reviewed and are negative.   Blood pressure 122/81, pulse 87, temperature 98.4 F (36.9 C), temperature source Oral, resp. rate 18, height _0  (1.702 m), weight 127 kg (280 lb), SpO2 99 %.Body mass index is 43.85 kg/m.  General Appearance: Casual  Eye Contact:  Good  Speech:  Clear and Coherent  Volume:  Normal  Mood:  Anxious, Depressed and Hopeless  Affect:  Congruent  Thought Process:  Goal Directed and Descriptions of Associations: Intact  Orientation:  Full (Time, Place, and Person)  Thought Content:  WDL  Suicidal Thoughts:  Yes.  without intent/plan  Homicidal Thoughts:  No  Memory:  Immediate;   Fair Recent;   Fair Remote;   Fair  Judgement:  Impaired  Insight:  Shallow  Psychomotor Activity:  Decreased  Concentration:  Concentration: Fair and Attention Span: Fair  Recall:  [employer] of Knowledge:  Fair  Language:  Fair  Akathisia:  No  Handed:  Right  AIMS (if indicated):     Assets:  Communication Skills Desire for Improvement Physical  Health Resilience Social Support  ADL's:  Intact  Cognition:  WNL  Sleep:  Number of Hours: 6.45     Treatment Plan Summary: Daily contact with patient to assess and evaluate symptoms and progress in treatment and Medication management   Tammy Robles is a 46 year old female with history of depression, anxiety, mood instability admitted for suicidal ideation in the context of severe social stressors.  1. Suicidal ideation. The patient is able to contract for safety in the hospital.  2. Mood. The patient has been maintained on a combination of Seroquel 150 mg, Lamictal 300 mg, Prozac 80 mg, Wellbutrin 300 mg, and Neurontin 400 mg 3 times a day for depression, anxiety, and mood stabilization. We increased Seroquel to 300 mg, lowered Lamictal to 200 mg and discontinued Wellbutrin.   3. Anxiety. Luvox and Vistaril were added for anxiety.  3. Headaches. There is improved with a combination of Seroquel and Neurontin.  4. PTSD. We started Minipress for flashbacks.  5. Metabolic syndrome monitoring. Lipid panel and hemoglobin A1c are normal.  6. EKG. Pending.  7. Cannabis abuse. The patient minimizes her problems and declines treatment.  8. Nausea. We switched to Compazine.  9. Migraine headache. We will give Imitrex.  10. Smoking. Nicotine patch is available.  11. Disposition. She will be discharged with her friends. She will follow up with [facility]. She has been established as the patient with charity care at [facility] but it is difficult for her to keep her appointments since she relocated to our area.   Orson Slick, MD 12/23/2016, 4:40 PM

## 2016-12-23 NOTE — [facility] Group Notes (Signed)
[facility] LCSW Group Therapy Note  Date/Time: 12/23/16, 0930  Type of Therapy and Topic:  Group Therapy:  Feelings around Relapse and Recovery  Participation Level:  Active   Mood: sad, tearful  Description of Group:    Patients in this group will discuss emotions they experience before and after a relapse. They will process how experiencing these feelings, or avoidance of experiencing them, relates to having a relapse. Facilitator will guide patients to explore emotions they have related to recovery. Patients will be encouraged to process which emotions are more powerful. They will be guided to discuss the emotional reaction significant others in their lives may have to patients' relapse or recovery. Patients will be assisted in exploring ways to respond to the emotions of others without this contributing to a relapse.  Therapeutic Goals: 1. Patient will identify two or more emotions that lead to relapse for them:  2. Patient will identify two emotions that result when they relapse:  3. Patient will identify two emotions related to recovery:  4. Patient will demonstrate ability to communicate their needs through discussion and/or role plays.   Summary of Patient Progress: Pt shared with group about her mother's death 5 years ago and her angry feelings towards her brother who did not help care for her prior to her passing.  Pt reports sadness and anger as feelings that can lead to relapse for her.  Pt open to further discussing her situation in therapy at [facility] after discharge.     Therapeutic Modalities:   Cognitive Behavioral Therapy Solution-Focused Therapy Assertiveness Training Relapse Prevention Therapy  Daleen Squibb, LCSW

## 2016-12-23 NOTE — Plan of Care (Signed)
Problem: [facility] Participation in Recreation Therapeutic Interventions Goal: STG-Patient will demonstrate improved self esteem by identif STG: Self-Esteem - Within 4 treatment sessions, patient will verbalize at least 5 positive affirmation statements in each of 2 treatment sessions to increase self-esteem.  Outcome: Progressing Treatment Session 1; Completed 1 out of 2: At approximately 4:05 pm, LRT met with patient in craft room. Patient verbalized 5 positive affirmation statements. Patient reported it felt "good". LRT encouraged patient to continue saying positive affirmation statements.   M , LRT/CTRS 08.17.18 4:22 pm Goal: STG-Other Recreation Therapy Goal (Specify) STG: Stress Management - Within 4 treatment sessions, patient will verbalize understanding of the stress management techniques in each of 2 treatment sessions to increase stress management skills.  Outcome: Progressing Treatment Session 1; Completed 1 out of 2: At approximately 4:05 pm, LRT met with patient in craft room. LRT educated and provided patient with stress management techniques. Patient verbalized understanding. LRT encouraged patient to read over and practice the stress management techniques.   M , LRT/CTRS 08.17.18 4:23 pm   

## 2016-12-23 NOTE — Plan of Care (Signed)
Problem: Activity: Goal: Sleeping patterns will improve Outcome: Progressing Patient slept for Estimated Hours of 6.45; Precautionary checks every 15 minutes for safety maintained, room free of safety hazards, patient sustains no injury or falls during this shift.    

## 2016-12-23 NOTE — Plan of Care (Signed)
Problem: Coping: Goal: Ability to verbalize frustrations and anger appropriately will improve Outcome: Progressing Pt verbalized feelings towards family and friends. Expressed what made her so angry.
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
## 2016-12-23 NOTE — Progress Notes (Signed)
Patient ID: Tammy Robles, female   DOB: 1970/06/04, 46 y.o.   MRN: [id number] It was too soon when patient requested for Tylenol and Zofran; affect and mood slightly flat and sad, denied SI/HI/AVH, visible and interacting well with peers.

## 2016-12-23 NOTE — [facility] Group Notes (Signed)
[facility] Group Notes:  (Nursing/MHT/Case Management/Adjunct)  Date:  12/23/2016  Time:  5:16 AM  Type of Therapy:  Psychoeducational Skills  Participation Level:  Active  Participation Quality:  Appropriate and Sharing  Affect:  Appropriate  Cognitive:  Appropriate  Insight:  Appropriate and Good  Engagement in Group:  Engaged  Modes of Intervention:  Discussion, Socialization and Support  Summary of Progress/Problems:  Tammy Robles 12/23/2016, 5:16 AM

## 2016-12-23 NOTE — Progress Notes (Signed)
Patient pleasant and cooperative with care. Med and group compliant. Appropriate with staff and peers. Denies SI, HI, AVH. Complains of generalized pain and nausea.  Encouragement and support offered. Medications given as prescribed. Safety checks maintained. Pt receptive and receptive and remains safe on unit with q 15 min checks.

## 2016-12-23 NOTE — Progress Notes (Signed)
Patient reports being angry with family for not helping with mother. Pt states after mom died, family act as if she was nothing so she continues to be upset. Pt denies SI, HI, AVH.

## 2016-12-24 MED ORDER — NICOTINE 14 MG/24HR TD PT24
14.0000 mg | MEDICATED_PATCH | Freq: Every day | TRANSDERMAL | Status: DC
  Administered 2016-12-25 – 2016-12-26 (×2): 14 mg via TRANSDERMAL
  Filled 2016-12-24 (×2): qty 1

## 2016-12-24 NOTE — [facility] Group Notes (Signed)
[facility] Group Notes:  (Nursing/MHT/Case Management/Adjunct)  Date:  12/24/2016  Time:  9:34 PM  Type of Therapy:  Evening Wrap-up Group  Participation Level:  Active  Participation Quality:  Appropriate and Attentive  Affect:  Appropriate  Cognitive:  Alert and Appropriate  Insight:  Appropriate, Good and Improving  Engagement in Group:  Developing/Improving and Engaged  Modes of Intervention:  Discussion  Summary of Progress/Problems:  Tomasita Morrow 12/24/2016, 9:34 PM

## 2016-12-24 NOTE — Plan of Care (Addendum)
Problem: Safety: Goal: Periods of time without injury will increase Outcome: Progressing Patient remains safe and without injury during hospitalization and on Q 15 minute observation. Will continue to monitor patient.    

## 2016-12-24 NOTE — Progress Notes (Signed)
Patient ID: Tammy Robles, female   DOB: 1971/01/28, 46 y.o.   MRN: [id number]   CSW went to patient's room to discuss her discharge plan. Patient states that she has somewhere to stay at discharge. Stated she would be staying with [place] and [place] Heys in [place]. Patient states her follow-up is to start services with [facility] and work with [facility] specialist to ensure she keeps her schedule appointments. Patient inquired about the [place] bus and how accessible they are in the area. CSW provided information about the [place] bus system. No further questions for CSW at this time.   Kelsay Haggard G. [name] MSW, [facility] 12/24/2016 10:55 AM

## 2016-12-24 NOTE — [facility] Group Notes (Signed)
[facility] LCSW Group Therapy  12/24/2016 1:09 PM  Type of Therapy:  Group Therapy  Participation Level:  Patient did not attend group. CSW invited patient to group.   Summary of Progress/Problems:  Samantha Ragen G. [name] MSW, LCSWA 12/24/2016, 1:09 PM

## 2016-12-24 NOTE — Progress Notes (Addendum)
Pt very pleasant. Pt continues to feel depressed about life in general. Pt encouraged to use coping skills and talk to staff if she needs to. Pt verbalized understanding. Pt med compliant, no adverse affects noted. Pt denies SI, HI or A/V hallucinations. Pt did come to group and had a snack. Pt has appropriate interaction with staff and peers. Pt has c/o headache 4/10, pt received Tylenol 650 mg tab po, positive effect noted. Pt reassessed with 0 pain reported. Pt also was experiencing nausea, Compazine 10 mg tab po administered, positive effect noted. 15 min safety checks continues. Pt slept 7.5 hours.

## 2016-12-24 NOTE — Progress Notes (Signed)
Patient was visible on the unit at times and attended unit group and activities. Patient denies any SI/HI/AH/VH. Patient is alert and oriented x 4, breathing unlabored, and extremities x 4 within normal limits. Patient is calm and cooperative. Patient did not display any disruptive behavior. Patient continues to be monitored on 15 minute safety checks. Will continue to monitor patient and notify MD of any changes.

## 2016-12-24 NOTE — Progress Notes (Signed)
[facility] MD Progress Note  12/24/2016 2:29 PM Tammy Robles  MRN:  [id number]  Subjective:   Tammy Robles is a 46 year old female with a history of depression, anxiety and mood instability admitted for suicidal ideation in the context of social stressors. She has been a Tammy Robles at [facility] prescribed multiple medications.   12/21/2016. Tammy Robles met with treatment team today. She is still depressed and has passive suicidal thoughts but slept better last night with Seroquel. She seems to tolerate the Luvox well and we will increase her dose. She complains of nausea and will be given Zofran. Her blood pressure is normal we will try to give 1 dose of Minipress for flashbacks. Good group participation.  12/22/2016. Tammy Robles feels better. She engages easily and is rather talkative on the interview but always talks about other people in her circle, unwilling to address her own problems. She accepts medications and tolerates them well. Nausea has resolved but she developed migraine headache this morning preventing her from group participation. We will give Imitrex.  12/23/2016. Tammy Robles continues to improve but still feels sad with flat affect. She is very somatic and possibly drug seeking. Complains of "pain" and nausea. She is on Zofran but asking for phenergan. We switch to Compazine and add Vistaril. She confirms that she can stay with friends. Anticipated discharge on Monday.  Follow-up Saturday the 18th. Tammy Robles continues to be somatically focused. Complains of nausea although without any vomiting. Complains of her chronic hip pain. She is asking for "something for nausea" even though she already has when necessary medicine. She does suggest that perhaps we should lower her nicotine patch dose as this may be causing her nausea. She denies suicidal ideation although she seems flat and withdrawn still.  Per nursing: It was too soon when Tammy Robles requested for Tylenol and Zofran; affect and mood slightly flat and  sad, denied SI/HI/AVH, visible and interacting well with peers.  Principal Problem: Bipolar 2 disorder ([place]) Diagnosis:   Tammy Robles Active Problem List   Diagnosis Date Noted  . Tobacco use disorder [F17.200] 12/23/2016  . Bipolar 2 disorder ([place]) [F31.81] 12/20/2016  . Cannabis use disorder, moderate, dependence ([place]) [F12.20] 12/20/2016  . Migraine headache [G43.909] 12/20/2016  . Borderline personality disorder [F60.3] 11/04/2014  . PTSD (post-traumatic stress disorder) [F43.10] 03/29/2004   Total Time spent with Tammy Robles: 30 minutes  Past Psychiatric History: depression, anxiety, mood instability.  Past Medical History:  Past Medical History:  Diagnosis Date  . Bladder prolapse, female, acquired   . Endometriosis 1996  . Endometritis 1990   fever 105 post delivery  . Polycystic ovarian disease 1993    Past Surgical History:  Procedure Laterality Date  . ABDOMINAL HYSTERECTOMY    . 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 HIP ARTHROPLASTY    . hysterrectomy  1996  . LAPAROTOMY     multiple for cysts  . LEFT OOPHORECTOMY  2007  . mass off appendix    . RIGHT OOPHORECTOMY    . WRIST SURGERY  2007 and 2003   both tendon release   Family History:  Family History  Problem Relation Age of Onset  . Stroke Mother   . Cancer Father    Family Psychiatric  History: depression, bipolar, completed suicide in grandfather. Social History:  History  Alcohol Use No     History  Drug Use No    Social History   Social History  .  Marital status: Single    Spouse name: N/A  . Number of children: N/A  . Years of education: N/A   Social History Main Topics  . Smoking status: Former Smoker    Packs/day: 0.50  . Smokeless tobacco: Never Used  . Alcohol use No  . Drug use: No  . Sexual activity: Not Asked   Other Topics Concern  . None   Social History Narrative  . None   Additional Social History:                          Sleep: Fair  Appetite:  Fair  Current Medications: Current Facility-Administered Medications  Medication Dose Route Frequency Provider Last Rate Last Dose  . acetaminophen (TYLENOL) tablet 650 mg  650 mg Oral Q6H PRN Hernandez-Gonzalez, Andrea, MD   650 mg at 12/24/16 0640  . alum & mag hydroxide-simeth (MAALOX/MYLANTA) 200-200-20 MG/5ML suspension 30 mL  30 mL Oral Q4H PRN Hernandez-Gonzalez, Andrea, MD      . fluvoxaMINE (LUVOX) tablet 150 mg  150 mg Oral QHS Pucilowska, Jolanta B, MD   150 mg at 12/23/16 2103  . gabapentin (NEURONTIN) capsule 400 mg  400 mg Oral TID Shuder, Stephanie, RPH   400 mg at 12/24/16 1227  . hydrOXYzine (ATARAX/VISTARIL) tablet 25 mg  25 mg Oral TID PRN Pucilowska, Jolanta B, MD   25 mg at 12/24/16 1012  . lamoTRIgine (LAMICTAL) tablet 200 mg  200 mg Oral QHS Pucilowska, Jolanta B, MD   200 mg at 12/23/16 2103  . magnesium hydroxide (MILK OF MAGNESIA) suspension 30 mL  30 mL Oral Daily PRN Hernandez-Gonzalez, Andrea, MD      . nicotine (NICODERM CQ - dosed in mg/24 hours) patch 21 mg  21 mg Transdermal Daily Hernandez-Gonzalez, Andrea, MD   21 mg at 12/24/16 0639  . prazosin (MINIPRESS) capsule 2 mg  2 mg Oral Q breakfast Pucilowska, Jolanta B, MD   2 mg at 12/24/16 0842  . prochlorperazine (COMPAZINE) tablet 10 mg  10 mg Oral Q6H PRN Pucilowska, Jolanta B, MD   10 mg at 12/24/16 1227  . QUEtiapine (SEROQUEL) tablet 300 mg  300 mg Oral QHS Pucilowska, Jolanta B, MD   300 mg at 12/23/16 2102  . SUMAtriptan (IMITREX) tablet 50 mg  50 mg Oral Q2H PRN Pucilowska, Jolanta B, MD   50 mg at 12/22/16 1457  . white petrolatum (VASELINE) gel   Topical PRN Pucilowska, Jolanta B, MD   1 application at 12/21/16 1441    Lab Results:  No results found for this or any previous visit (from the past 48 hour(s)).  Blood Alcohol level:  Lab Results  Component Value Date   ETH <5 12/19/2016    Metabolic Disorder Labs: Lab Results  Component Value Date   HGBA1C 5.3  12/22/2016   MPG 105.41 12/22/2016   No results found for: PROLACTIN Lab Results  Component Value Date   CHOL 172 12/20/2016   TRIG 121 12/20/2016   HDL 55 12/20/2016   CHOLHDL 3.1 12/20/2016   VLDL 24 12/20/2016   LDLCALC 93 12/20/2016    Physical Findings: AIMS:  , ,  ,  ,    CIWA:    COWS:     Musculoskeletal: Strength & Muscle Tone: within normal limits Gait & Station: normal Tammy Robles leans: N/A  Psychiatric Specialty Exam: Physical Exam  Nursing note and vitals reviewed. Constitutional: She appears well-developed and well-nourished.  HENT:  Head: Normocephalic   and atraumatic.  Eyes: Pupils are equal, round, and reactive to light. Conjunctivae are normal.  Neck: Normal range of motion.  Cardiovascular: Normal heart sounds.   Respiratory: Effort normal.  GI: Soft.  Musculoskeletal: Normal range of motion.  Neurological: She is alert.  Skin: Skin is warm and dry.  Psychiatric: Her speech is normal and behavior is normal. Cognition and memory are normal. She expresses impulsivity. She exhibits a depressed mood. She expresses suicidal ideation.    Review of Systems  Gastrointestinal: Positive for nausea.  Musculoskeletal: Positive for back pain and joint pain.  Neurological: Positive for tingling and headaches.  Psychiatric/Behavioral: Positive for depression and suicidal ideas. The Tammy Robles is nervous/anxious and has insomnia.   All other systems reviewed and are negative.   Blood pressure 127/74, pulse 67, temperature 98.4 F (36.9 C), resp. rate 18, height 5' 7" (1.702 m), weight 280 lb (127 kg), SpO2 99 %.Body mass index is 43.85 kg/m.  General Appearance: Casual  Eye Contact:  Good  Speech:  Clear and Coherent  Volume:  Normal  Mood:  Anxious, Depressed and Hopeless  Affect:  Congruent  Thought Process:  Goal Directed and Descriptions of Associations: Intact  Orientation:  Full (Time, Place, and Person)  Thought Content:  WDL  Suicidal Thoughts:  Yes.   without intent/plan  Homicidal Thoughts:  No  Memory:  Immediate;   Fair Recent;   Fair Remote;   Fair  Judgement:  Impaired  Insight:  Shallow  Psychomotor Activity:  Decreased  Concentration:  Concentration: Fair and Attention Span: Fair  Recall:  [employer] of Knowledge:  Fair  Language:  Fair  Akathisia:  No  Handed:  Right  AIMS (if indicated):     Assets:  Communication Skills Desire for Improvement Physical Health Resilience Social Support  ADL's:  Intact  Cognition:  WNL  Sleep:  Number of Hours: 7     Treatment Plan Summary: Daily contact with Tammy Robles to assess and evaluate symptoms and progress in treatment and Medication management   Ms. Reamer is a 46 year old female with history of depression, anxiety, mood instability admitted for suicidal ideation in the context of severe social stressors.  1. Suicidal ideation. The Tammy Robles is able to contract for safety in the hospital.  2. Mood. The Tammy Robles has been maintained on a combination of Seroquel 150 mg, Lamictal 300 mg, Prozac 80 mg, Wellbutrin 300 mg, and Neurontin 400 mg 3 times a day for depression, anxiety, and mood stabilization. We increased Seroquel to 300 mg, lowered Lamictal to 200 mg and discontinued Wellbutrin.   3. Anxiety. Luvox and Vistaril were added for anxiety.  3. Headaches. There is improved with a combination of Seroquel and Neurontin.  4. PTSD. We started Minipress for flashbacks.  5. Metabolic syndrome monitoring. Lipid panel and hemoglobin A1c are normal.  6. EKG. Pending.  7. Cannabis abuse. The Tammy Robles minimizes her problems and declines treatment.  8. Nausea. We switched to Compazine.  9. Migraine headache. We will give Imitrex.  10. Smoking. Nicotine patch is available.  11. Disposition. She will be discharged with her friends. She will follow up with [facility]. She has been established as the Tammy Robles with charity care at [facility] but it is difficult for her to keep her  appointments since she relocated to our area.   Cut down nicotine dose to 14 mg at her request. Encourage Tammy Robles to be up and out of bed. She ordered he has when necessary medicine available no need to  change any of that.  Alethia Berthold, MD 12/24/2016, 2:29 PM

## 2016-12-24 NOTE — Plan of Care (Signed)
Problem: Safety: Goal: Ability to remain free from injury will improve Outcome: Progressing Pt will remain injury free the entire shift.   

## 2016-12-24 NOTE — Plan of Care (Signed)
Problem: Activity: Goal: Interest or engagement in activities will improve Outcome: Progressing Patient social with select peer and also staff. Visible in the milieu and cooperative during assessment. Patient is able to verbalize healthy coping activities post discharge.  Goal: Sleeping patterns will improve Patient is currently in bed with her eyes closed and appears to be resting. Will continue to monitor for disturbances. No complaints of poor sleep reported.   Problem: Education: Goal: Mental status will improve Outcome: Progressing Patient reports increased mood. Denies suicidal ideations.   Problem: Coping: Goal: Ability to verbalize frustrations and anger appropriately will improve Patient open with staff during assessment, verbalizing disapproval and anger with family.   Problem: Self-Concept: Goal: Level of anxiety will decrease No visible signs of anxiety noted. No complaints of anxiety reported. Will continue to monitor.   Problem: Fluid Volume: Goal: Ability to maintain a balanced intake and output will improve Outcome: Progressing Patient consumed PM snack. No changes in appetite noted or reported.

## 2016-12-24 NOTE — [facility] Suicide Risk Assessment (Signed)
[facility] INPATIENT:  Family/Significant Other Suicide Prevention Education  Suicide Prevention Education:  Contact Attempts: Francene Boyers [phone]), has been identified by the patient as the family member/significant other with whom the patient will be residing, and identified as the person(s) who will aid the patient in the event of a mental health crisis.  With written consent from the patient, two attempts were made to provide suicide prevention education, prior to and/or following the patient's discharge.  We were unsuccessful in providing suicide prevention education.  A suicide education pamphlet was given to the patient to share with family/significant other.  Date and time of second attempt: 12/24/2016 / 10:51 am  Yaiden Yang G. [name] MSW, LCSWA 12/24/2016, 10:51 AM

## 2016-12-25 MED ORDER — PRAZOSIN HCL 2 MG PO CAPS: 2.0000 mg | ORAL_CAPSULE | Freq: Every day | ORAL | 1 refills | Status: DC

## 2016-12-25 MED ORDER — HYDROXYZINE HCL 25 MG PO TABS: 25.0000 mg | ORAL_TABLET | Freq: Three times a day (TID) | ORAL | 1 refills | Status: DC | PRN

## 2016-12-25 MED ORDER — GABAPENTIN 400 MG PO CAPS: 400.0000 mg | ORAL_CAPSULE | Freq: Three times a day (TID) | ORAL | 1 refills | Status: DC

## 2016-12-25 MED ORDER — PROCHLORPERAZINE MALEATE 10 MG PO TABS: 10.0000 mg | ORAL_TABLET | Freq: Four times a day (QID) | ORAL | 1 refills | Status: DC | PRN

## 2016-12-25 MED ORDER — FLUVOXAMINE MALEATE 50 MG PO TABS: 150.0000 mg | ORAL_TABLET | Freq: Every day | ORAL | 1 refills | Status: DC

## 2016-12-25 MED ORDER — ARTIFICIAL TEARS OPHTHALMIC OINT
TOPICAL_OINTMENT | OPHTHALMIC | Status: DC | PRN
  Administered 2016-12-25: 21:00:00 via OPHTHALMIC
  Filled 2016-12-25: qty 3.5

## 2016-12-25 MED ORDER — QUETIAPINE FUMARATE 300 MG PO TABS: 300.0000 mg | ORAL_TABLET | Freq: Every day | ORAL | 1 refills | Status: DC

## 2016-12-25 MED ORDER — LAMOTRIGINE 200 MG PO TABS: 200.0000 mg | ORAL_TABLET | Freq: Every day | ORAL | 1 refills | Status: DC

## 2016-12-25 NOTE — [facility] Group Notes (Signed)
[facility] LCSW Group Therapy  12/25/2016 2:13 PM  Type of Therapy:  Group Therapy  Participation Level:  Patient did not attend group. CSW invited patient to group.   Summary of Progress/Problems:  Tammy Robles G. [name] MSW, LCSWA 12/25/2016, 2:13 PM

## 2016-12-25 NOTE — Plan of Care (Signed)
Problem: Activity: Goal: Sleeping patterns will improve Outcome: Progressing Pt will get 5 or more hours of sleep this shift.

## 2016-12-25 NOTE — Plan of Care (Signed)
Problem: Safety: Goal: Periods of time without injury will increase Outcome: Progressing Patient remains safe and without injury during hospitalization and on Q 15 minute observation. Will continue to monitor patient.    

## 2016-12-25 NOTE — Progress Notes (Signed)
Patient was visible at unit at times sitting in dayroom and attending unit group and activities. Patient denies SI/HI/AH/VH. Patient complain of some nausea and received a prn antiemetic. Patient is compliant with medications and meals. Patient is alert and oriented x 4, breathing unlabored, and extremities x 4 within normal limits. Patient is calm and cooperative. Patient did not display any disruptive behavior. EKG was completed. Patient continues to be monitored on 15 minute safety checks. Will continue to monitor patient and notify MD of any changes.

## 2016-12-25 NOTE — Progress Notes (Addendum)
Pt is very pleasant. Appropriate interaction with staff and peers. Pt is med compliant and cooperative. Denies SI, HI, or A/V hallucinations. Pt did have c/o of nausea, Compazine 10 mg po given, reassessed pt, pt reports nausea has improved. Pt did have c/o of dry eyes, liquid eye drops given with positive effect noted. 15 min safety checks continue, Pt slept 6.75 hrs.

## 2016-12-25 NOTE — [facility] Group Notes (Signed)
[facility] Group Notes:  (Nursing/MHT/Case Management/Adjunct)  Date:  12/25/2016  Time:  9:08 PM  Type of Therapy:  Group Therapy  Participation Level:  Active  Participation Quality:  Appropriate  Affect:  Appropriate  Cognitive:  Appropriate  Insight:  Appropriate  Engagement in Group:  Engaged  Modes of Intervention:  Support  Summary of Progress/Problems:  Tammy Robles 12/25/2016, 9:08 PM

## 2016-12-25 NOTE — Progress Notes (Signed)
[facility] MD Progress Note  12/25/2016 2:05 PM Tammy Robles  MRN:  [id number]  Subjective:   Tammy Robles is a 46 year old female with a history of depression, anxiety and mood instability admitted for suicidal ideation in the context of social stressors. Tammy Robles has been a Tammy Robles at [facility] prescribed multiple medications.   12/21/2016. Tammy Robles met with treatment team today. Tammy Robles is still depressed and has passive suicidal thoughts but slept better last night with Seroquel. Tammy Robles seems to tolerate the Luvox well and we will increase Tammy Robles dose. Tammy Robles complains of nausea and will be given Zofran. Tammy Robles blood pressure is normal we will try to give 1 dose of Minipress for flashbacks. Good group participation.  12/22/2016. Tammy Robles feels better. Tammy Robles engages easily and is rather talkative on the interview but always talks about other people in Tammy Robles circle, unwilling to address Tammy Robles own problems. Tammy Robles accepts medications and tolerates them well. Nausea has resolved but Tammy Robles developed migraine headache this morning preventing Tammy Robles from group participation. We will give Imitrex.  12/23/2016. Tammy Robles continues to improve but still feels sad with flat affect. Tammy Robles is very somatic and possibly drug seeking. Complains of "pain" and nausea. Tammy Robles is on Zofran but asking for phenergan. We switch to Compazine and add Vistaril. Tammy Robles confirms that Tammy Robles can stay with friends. Anticipated discharge on Monday.  Follow-up Saturday the 18th. Tammy Robles continues to be somatically focused. Complains of nausea although without any vomiting. Complains of Tammy Robles chronic hip pain. Tammy Robles is asking for "something for nausea" even though Tammy Robles already has when necessary medicine. Tammy Robles does suggest that perhaps we should lower Tammy Robles nicotine patch dose as this may be causing Tammy Robles nausea. Tammy Robles denies suicidal ideation although Tammy Robles seems flat and withdrawn still.  Follow-up for this 46 year old woman. Reports that Tammy Robles mood is feeling fine. Denies any suicidal thoughts.  Continues to be mostly somatically focused complaining today not only of nausea but dry eyes.  Per nursing: It was too soon when Tammy Robles requested for Tylenol and Zofran; affect and mood slightly flat and sad, denied SI/HI/AVH, visible and interacting well with peers.  Principal Problem: Bipolar 2 disorder ([place]) Diagnosis:   Tammy Robles Active Problem List   Diagnosis Date Noted  . Tobacco use disorder [F17.200] 12/23/2016  . Bipolar 2 disorder ([place]) [F31.81] 12/20/2016  . Cannabis use disorder, moderate, dependence ([place]) [F12.20] 12/20/2016  . Migraine headache [G43.909] 12/20/2016  . Borderline personality disorder [F60.3] 11/04/2014  . PTSD (post-traumatic stress disorder) [F43.10] 03/29/2004   Total Time spent with Tammy Robles: 30 minutes  Past Psychiatric History: depression, anxiety, mood instability.  Past Medical History:  Past Medical History:  Diagnosis Date  . Bladder prolapse, female, acquired   . Endometriosis 1996  . Endometritis 1990   fever 105 post delivery  . Polycystic ovarian disease 1993    Past Surgical History:  Procedure Laterality Date  . ABDOMINAL HYSTERECTOMY    . 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 HIP ARTHROPLASTY    . hysterrectomy  1996  . LAPAROTOMY     multiple for cysts  . LEFT OOPHORECTOMY  2007  . mass off appendix    . RIGHT OOPHORECTOMY    . WRIST SURGERY  2007 and 2003   both tendon release   Family History:  Family History  Problem Relation Age of Onset  . Stroke Mother   . Cancer Father    Family Psychiatric  History: depression, bipolar,  completed suicide in grandfather. Social History:  History  Alcohol Use No     History  Drug Use No    Social History   Social History  . Marital status: Single    Spouse name: N/A  . Number of children: N/A  . Years of education: N/A   Social History Main Topics  . Smoking status: Former Smoker    Packs/day: 0.50  . Smokeless tobacco: Never  Used  . Alcohol use No  . Drug use: No  . Sexual activity: Not Asked   Other Topics Concern  . None   Social History Narrative  . None   Additional Social History:                         Sleep: Fair  Appetite:  Fair  Current Medications: Current Facility-Administered Medications  Medication Dose Route Frequency Provider Last Rate Last Dose  . acetaminophen (TYLENOL) tablet 650 mg  650 mg Oral Q6H PRN Hildred Priest, MD   650 mg at 12/25/16 0759  . alum & mag hydroxide-simeth (MAALOX/MYLANTA) 200-200-20 MG/5ML suspension 30 mL  30 mL Oral Q4H PRN Hildred Priest, MD      . artificial tears (LACRILUBE) ophthalmic ointment   Both Eyes Q4H PRN Melane Windholz T, MD      . fluvoxaMINE (LUVOX) tablet 150 mg  150 mg Oral QHS Pucilowska, Jolanta B, MD   150 mg at 12/24/16 2111  . gabapentin (NEURONTIN) capsule 400 mg  400 mg Oral TID Candelaria Stagers, RPH   400 mg at 12/25/16 1203  . hydrOXYzine (ATARAX/VISTARIL) tablet 25 mg  25 mg Oral TID PRN Pucilowska, Jolanta B, MD   25 mg at 12/25/16 0759  . lamoTRIgine (LAMICTAL) tablet 200 mg  200 mg Oral QHS Pucilowska, Jolanta B, MD   200 mg at 12/24/16 2111  . magnesium hydroxide (MILK OF MAGNESIA) suspension 30 mL  30 mL Oral Daily PRN Hildred Priest, MD      . nicotine (NICODERM CQ - dosed in mg/24 hours) patch 14 mg  14 mg Transdermal Daily Westyn Keatley, Madie Reno, MD   14 mg at 12/25/16 0759  . prazosin (MINIPRESS) capsule 2 mg  2 mg Oral Q breakfast Pucilowska, Jolanta B, MD   2 mg at 12/25/16 0759  . prochlorperazine (COMPAZINE) tablet 10 mg  10 mg Oral Q6H PRN Pucilowska, Jolanta B, MD   10 mg at 12/25/16 1203  . QUEtiapine (SEROQUEL) tablet 300 mg  300 mg Oral QHS Pucilowska, Jolanta B, MD   300 mg at 12/24/16 2111  . SUMAtriptan (IMITREX) tablet 50 mg  50 mg Oral Q2H PRN Pucilowska, Jolanta B, MD   50 mg at 12/22/16 1457  . white petrolatum (VASELINE) gel   Topical PRN Pucilowska, Jolanta B, MD   1  application at 32/67/12 1441    Lab Results:  No results found for this or any previous visit (from the past 48 hour(s)).  Blood Alcohol level:  Lab Results  Component Value Date   ETH <5 45/80/9983    Metabolic Disorder Labs: Lab Results  Component Value Date   HGBA1C 5.3 12/22/2016   MPG 105.41 12/22/2016   No results found for: PROLACTIN Lab Results  Component Value Date   CHOL 172 12/20/2016   TRIG 121 12/20/2016   HDL 55 12/20/2016   CHOLHDL 3.1 12/20/2016   VLDL 24 12/20/2016   LDLCALC 93 12/20/2016    Physical Findings: AIMS:  , ,  ,  ,  CIWA:    COWS:     Musculoskeletal: Strength & Muscle Tone: within normal limits Gait & Station: normal Tammy Robles leans: N/A  Psychiatric Specialty Exam: Physical Exam  Nursing note and vitals reviewed. Constitutional: Tammy Robles appears well-developed and well-nourished.  HENT:  Head: Normocephalic and atraumatic.  Eyes: Pupils are equal, round, and reactive to light. Conjunctivae are normal.  Neck: Normal range of motion.  Cardiovascular: Normal heart sounds.   Respiratory: Effort normal.  GI: Soft.  Musculoskeletal: Normal range of motion.  Neurological: Tammy Robles is alert.  Skin: Skin is warm and dry.  Psychiatric: Tammy Robles speech is normal and behavior is normal. Cognition and memory are normal. Tammy Robles expresses impulsivity. Tammy Robles exhibits a depressed mood. Tammy Robles expresses suicidal ideation.    Review of Systems  Gastrointestinal: Positive for nausea.  Musculoskeletal: Positive for back pain and joint pain.  Neurological: Positive for tingling and headaches.  Psychiatric/Behavioral: Positive for depression and suicidal ideas. The Tammy Robles is nervous/anxious and has insomnia.   All other systems reviewed and are negative.   Blood pressure 118/64, pulse 70, temperature 98.7 F (37.1 C), temperature source Oral, resp. rate 18, height _0  (1.702 m), weight 127 kg (280 lb), SpO2 99 %.Body mass index is 43.85 kg/m.  General Appearance:  Casual  Eye Contact:  Good  Speech:  Clear and Coherent  Volume:  Normal  Mood:  Anxious, Depressed and Hopeless  Affect:  Congruent  Thought Process:  Goal Directed and Descriptions of Associations: Intact  Orientation:  Full (Time, Place, and Person)  Thought Content:  WDL  Suicidal Thoughts:  Yes.  without intent/plan  Homicidal Thoughts:  No  Memory:  Immediate;   Fair Recent;   Fair Remote;   Fair  Judgement:  Impaired  Insight:  Shallow  Psychomotor Activity:  Decreased  Concentration:  Concentration: Fair and Attention Span: Fair  Recall:  [employer] of Knowledge:  Fair  Language:  Fair  Akathisia:  No  Handed:  Right  AIMS (if indicated):     Assets:  Communication Skills Desire for Improvement Physical Health Resilience Social Support  ADL's:  Intact  Cognition:  WNL  Sleep:  Number of Hours: 7.3     Treatment Plan Summary: Daily contact with Tammy Robles to assess and evaluate symptoms and progress in treatment and Medication management   Ms. Chevere is a 46 year old female with history of depression, anxiety, mood instability admitted for suicidal ideation in the context of severe social stressors.  1. Suicidal ideation. The Tammy Robles is able to contract for safety in the hospital.  2. Mood. The Tammy Robles has been maintained on a combination of Seroquel 150 mg, Lamictal 300 mg, Prozac 80 mg, Wellbutrin 300 mg, and Neurontin 400 mg 3 times a day for depression, anxiety, and mood stabilization. We increased Seroquel to 300 mg, lowered Lamictal to 200 mg and discontinued Wellbutrin.   3. Anxiety. Luvox and Vistaril were added for anxiety.  3. Headaches. There is improved with a combination of Seroquel and Neurontin.  4. PTSD. We started Minipress for flashbacks.  5. Metabolic syndrome monitoring. Lipid panel and hemoglobin A1c are normal.  6. EKG. Pending.  7. Cannabis abuse. The Tammy Robles minimizes Tammy Robles problems and declines treatment.  8. Nausea. We  switched to Compazine.  9. Migraine headache. We will give Imitrex.  10. Smoking. Nicotine patch is available.  11. Disposition. Tammy Robles will be discharged with Tammy Robles friends. Tammy Robles will follow up with [facility]. Tammy Robles has been established as the Tammy Robles with charity care  at [facility] but it is difficult for Tammy Robles to keep Tammy Robles appointments since Tammy Robles relocated to our area.   Still feeling sick to Tammy Robles stomach but not vomiting. Affect euthymic. Doesn't get out and interact much. No sign of active psychosis and denies suicidal thoughts. Orders added for lubricating eyedrops.  Alethia Berthold, MD 12/25/2016, 2:05 PM

## 2016-12-26 NOTE — Progress Notes (Signed)
Patient discharged on above date and time. Affect pleasant and cooperative. Signed all discharge paperwork, received back personal items along with prescriptions. Patient stated, " The person that I am going to live with is coming to [place] for her treatment at the [facility]. I will meet her there and ride home with her." This [facility], no complaints voiced.

## 2016-12-26 NOTE — Progress Notes (Signed)
Recreation Therapy Notes  Date: 08.20.18 Time: 1:00 pm Location: Craft Room  Group Topic: Wellness  Goal Area(s) Addresses:  Patient will identify at least one item per dimension of health. Patient will examine areas they are deficient in.  Behavioral Response: Attentive, Interactive  Intervention: 6 Dimensions of Health  Activity: Patients were given a definition sheet defining each dimension of health and a worksheet with each dimension listed. Patients were instructed to write items they were currently doing in each dimension to contribute to their wellness.  Education: LRT educated patients on ways to improve each dimension of health.  Education Outcome: Acknowledges education/In group clarification offered   Clinical Observations/Feedback: Patient wrote at least one item in each dimension. Patient contributed to group discussion by stating what area she was not giving enough attention to, what area she was giving enough attention to, how to improve certain dimensions, how this activity relates to her admission, how this activity relates to her d/c, and what would change for her if she was more aware of her wellness.  Jacquelynn Cree, LRT/CTRS 12/26/2016 1:49 PM

## 2016-12-26 NOTE — Plan of Care (Signed)
Problem: [facility] Participation in Recreation Therapeutic Interventions Goal: STG-Patient will demonstrate improved self esteem by identif STG: Self-Esteem - Within 4 treatment sessions, patient will verbalize at least 5 positive affirmation statements in each of 2 treatment sessions to increase self-esteem.  Outcome: Completed/Met Date Met: 12/26/16 Treatment Session 2; Completed 2 out of 2: At approximately 11:30 am, LRT met with patient in craft room. Patient verbalized 5 positive affirmation statements. Patient reported it felt "good". LRT encouraged patient to continue saying positive affirmation statements.  Leonette Monarch, LRT/CTRS 08.20.18 3:29 pm Goal: STG-Other Recreation Therapy Goal (Specify) STG: Stress Management - Within 4 treatment sessions, patient will verbalize understanding of the stress management techniques in each of 2 treatment sessions to increase stress management skills.  Outcome: Completed/Met Date Met: 12/26/16 Treatment Session 2; Completed 2 out of 2: At approximately 11:30 am, LRT met with patient in craft room. Patient reported she read over the stress management techniques. Patient verbalized understanding. LRT encouraged patient to practice the stress management techniques.  Leonette Monarch, LRT/CTRS 08.20.18 3:30 pm

## 2016-12-26 NOTE — [facility] Group Notes (Signed)
[facility] LCSW Group Therapy   12/26/2016 9:30am Type of Therapy: Group Therapy   Participation Level: Active   Participation Quality: Attentive, Sharing and Supportive   Affect: Appropriate   Cognitive: Alert and Oriented   Insight: Developing/Improving and Engaged   Engagement in Therapy: Developing/Improving and Engaged   Modes of Intervention: Clarification, Confrontation, Discussion, Education, Exploration,  Limit-setting, Orientation, Problem-solving, Rapport Building, Dance movement psychotherapist, Socialization and Support   Summary of Progress/Problems: Pt identified obstacles faced currently and processed barriers involved in overcoming these obstacles. Pt identified steps necessary for overcoming these obstacles and explored motivation (internal and external) for facing these difficulties head on. Pt further identified one area of concern in their lives and chose a goal to focus on for today. Patient defined the word obstacle and identified the obstacle that led to this hospitalization. Patient engaged in CBT exercise and identified a change plan in order to overcome obstacles.   Hampton Abbot, MSW, LCSW-A 12/26/2016, 2:27PM

## 2016-12-26 NOTE — Plan of Care (Signed)
Problem: Activity: Goal: Interest or engagement in activities will improve Outcome: Progressing Patient engages in activities with appropriate interaction.  Problem: Education: Goal: Emotional status will improve Outcome: Progressing Patient's emotional status is improving.  Problem: Coping: Goal: Ability to verbalize frustrations and anger appropriately will improve Outcome: Progressing Patient able to verbalize frustrations and anger appropriately. Goal: Ability to demonstrate self-control will improve Outcome: Progressing Patient able to maintain self control.  Problem: Health Behavior/Discharge Planning: Goal: Compliance with treatment plan for underlying cause of condition will improve Outcome: Progressing Patient is compliant with underlying conditions  Problem: Safety: Goal: Periods of time without injury will increase Outcome: Progressing Patient has not sustained any  injuries

## 2016-12-26 NOTE — Progress Notes (Signed)
Recreation Therapy Notes  INPATIENT RECREATION TR PLAN  Patient Details Name: MEIRA WAHBA MRN: [id number] DOB: 1971-02-06 Today's Date: 12/26/2016  Rec Therapy Plan Is patient appropriate for Therapeutic Recreation?: Yes Treatment times per week: At least once a week TR Treatment/Interventions: 1:1 session, Group participation (Comment) (Appropriate participation in daily recreational therapy tx)  Discharge Criteria Pt will be discharged from therapy if:: Treatment goals are met, Discharged Treatment plan/goals/alternatives discussed and agreed upon by:: Patient/family  Discharge Summary Short term goals set: See Care Plan Short term goals met: Complete Progress toward goals comments: One-to-one attended Which groups?: Other (Comment), Self-esteem, Wellness, Social skills (Self-expression) One-to-one attended: Self-esteem, stress management Reason goals not met: N/A Therapeutic equipment acquired: None Reason patient discharged from therapy: Discharge from hospital Pt/family agrees with progress & goals achieved: Yes Date patient discharged from therapy: 12/26/16   Leonette Monarch, LRT/CTRS 12/26/2016, 3:31 PM
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
## 2016-12-26 NOTE — [facility] Suicide Risk Assessment (Signed)
[facility] Discharge Suicide Risk Assessment   Principal Problem: Bipolar 2 disorder [facility]) Discharge Diagnoses:  Patient Active Problem List   Diagnosis Date Noted  . Tobacco use disorder [F17.200] 12/23/2016  . Bipolar 2 disorder (HCC) [F31.81] 12/20/2016  . Cannabis use disorder, moderate, dependence (HCC) [F12.20] 12/20/2016  . Migraine headache [G43.909] 12/20/2016  . Borderline personality disorder [F60.3] 11/04/2014  . PTSD (post-traumatic stress disorder) [F43.10] 03/29/2004    Total Time spent with patient: 30 minutes  Musculoskeletal: Strength & Muscle Tone: within normal limits Gait & Station: normal Patient leans: N/A  Psychiatric Specialty Exam: Review of Systems  Psychiatric/Behavioral: Positive for substance abuse.  All other systems reviewed and are negative.   Blood pressure 121/71, pulse 70, temperature 98.5 F (36.9 C), temperature source Oral, resp. rate 16, height 5\' 7"  (1.702 m), weight 127 kg (280 lb), SpO2 99 %.Body mass index is 43.85 kg/m.  General Appearance: Casual  Eye Contact::  Good  Speech:  Clear and Coherent409  Volume:  Normal  Mood:  Euthymic  Affect:  Appropriate  Thought Process:  Goal Directed and Descriptions of Associations: Intact  Orientation:  Full (Time, Place, and Person)  Thought Content:  WDL  Suicidal Thoughts:  No  Homicidal Thoughts:  No  Memory:  Immediate;   Fair Recent;   Fair Remote;   Good  Judgement:  Impaired  Insight:  Shallow  Psychomotor Activity:  Normal  Concentration:  Fair  Recall:  [employer] of Knowledge:Fair  Language: Fair  Akathisia:  No  Handed:  Right  AIMS (if indicated):     Assets:  Communication Skills Desire for Improvement Housing Physical Health Resilience Social Support  Sleep:  Number of Hours: 6.75  Cognition: WNL  ADL's:  Intact   Mental Status Per Nursing Assessment::   On Admission:  NA  Demographic Factors:  Divorced or widowed, Caucasian, Low socioeconomic status and  Unemployed  Loss Factors: Decrease in vocational status, Loss of significant relationship and Financial problems/change in socioeconomic status  Historical Factors: Prior suicide attempts, Family history of mental illness or substance abuse and Impulsivity  Risk Reduction Factors:   Sense of responsibility to family, Living with another person, especially a relative and Positive social support  Continued Clinical Symptoms:  Bipolar Disorder:   Bipolar II Depressive phase Depression:   Impulsivity Alcohol/Substance Abuse/Dependencies  Cognitive Features That Contribute To Risk:  None    Suicide Risk:  Minimal: No identifiable suicidal ideation.  Patients presenting with no risk factors but with morbid ruminations; may be classified as minimal risk based on the severity of the depressive symptoms  Follow-up Information    [facility] Follow up on 12/28/2016.   Why:  [facility], [facility], will pick you up at 7:00am on Wednesday, 12/28/16, to transport you to your follow up appointment.  Please bring a copy of your hospital discharge paperwork. Contact information: [address] [place] [address] [phone]           Plan Of Care/Follow-up recommendations:  Activity:  as tolerated. Diet:  low sodium heart healthy. Other:  keep follow up appointments.  Kristine Linea, MD 12/26/2016, 8:05 AM

## 2016-12-26 NOTE — Progress Notes (Signed)
  [facility] Adult Case Management Discharge Plan :  Will you be returning to the same living situation after discharge:  No. Will be staying with a friend. At discharge, do you have transportation home?: Yes,  friend Do you have the ability to pay for your medications: No. Referred to medication management clinic.  Release of information consent forms completed and in the chart;  Patient's signature needed at discharge.  Patient to Follow up at: Follow-up Information    [facility] Follow up on 12/28/2016.   Why:  [facility] P[facility], [facility], will pick you up at 7:00am on Wednesday, 12/28/16, to transport you to your follow up appointment.  Please bring a copy of your hospital discharge paperwork. Contact information: [address] [place] [address] [phone]           Next level of care provider has access to [facility] Link:no  Safety Planning and Suicide Prevention discussed: No. Contact attempts made.  Have you used any form of tobacco in the last 30 days? (Cigarettes, Smokeless Tobacco, Cigars, and/or Pipes): Yes  Has patient been referred to the Quitline?: Yes, faxed on 12/26/16  Patient has been referred for addiction treatment: Yes  Lorri Frederick, LCSW 12/26/2016, 9:53 AM

## 2016-12-29 ENCOUNTER — Emergency Department: Payer: Self-pay

## 2016-12-29 ENCOUNTER — Encounter: Payer: Self-pay | Admitting: Emergency Medicine

## 2016-12-29 ENCOUNTER — Emergency Department
Admission: EM | Admit: 2016-12-29 | Discharge: 2016-12-29 | Disposition: A | Discharge status: Home / Self Care | Payer: Self-pay | Attending: Emergency Medicine | Admitting: Emergency Medicine

## 2016-12-29 DIAGNOSIS — Z87891 Personal history of nicotine dependence: Secondary | ICD-10-CM | POA: Insufficient documentation

## 2016-12-29 DIAGNOSIS — Z91040 Latex allergy status: Secondary | ICD-10-CM | POA: Insufficient documentation

## 2016-12-29 DIAGNOSIS — R1011 Right upper quadrant pain: Secondary | ICD-10-CM | POA: Insufficient documentation

## 2016-12-29 DIAGNOSIS — Z79899 Other long term (current) drug therapy: Secondary | ICD-10-CM | POA: Insufficient documentation

## 2016-12-29 LAB — CBC
HCT: 38 % (ref 35.0–47.0)
Hemoglobin: 12.9 g/dL (ref 12.0–16.0)
MCH: 30.2 pg (ref 26.0–34.0)
MCHC: 33.9 g/dL (ref 32.0–36.0)
MCV: 89 fL (ref 80.0–100.0)
PLATELETS: 332 10*3/uL (ref 150–440)
RBC: 4.27 MIL/uL (ref 3.80–5.20)
RDW: 13.3 % (ref 11.5–14.5)
WBC: 7.3 10*3/uL (ref 3.6–11.0)

## 2016-12-29 LAB — HEPATIC FUNCTION PANEL
ALK PHOS: 84 U/L (ref 38–126)
ALT: 14 U/L (ref 14–54)
AST: 23 U/L (ref 15–41)
Albumin: 4.2 g/dL (ref 3.5–5.0)
TOTAL PROTEIN: 7.8 g/dL (ref 6.5–8.1)
Total Bilirubin: 0.3 mg/dL (ref 0.3–1.2)

## 2016-12-29 LAB — BASIC METABOLIC PANEL WITH GFR
Anion gap: 9 (ref 5–15)
BUN: 8 mg/dL (ref 6–20)
CO2: 25 mmol/L (ref 22–32)
Calcium: 9.6 mg/dL (ref 8.9–10.3)
Chloride: 105 mmol/L (ref 101–111)
Creatinine, Ser: 0.86 mg/dL (ref 0.44–1.00)
GFR calc Af Amer: >60 mL/min
GFR calc non Af Amer: >60 mL/min
Glucose, Bld: 128 mg/dL — ABNORMAL HIGH (ref 65–99)
Potassium: 3.9 mmol/L (ref 3.5–5.1)
Sodium: 139 mmol/L (ref 135–145)

## 2016-12-29 LAB — TROPONIN I

## 2016-12-29 LAB — LIPASE, BLOOD: Lipase: 41 U/L (ref 11–51)

## 2016-12-29 MED ORDER — HYDROMORPHONE HCL 1 MG/ML IJ SOLN
0.5000 mg | Freq: Once | INTRAMUSCULAR | Status: AC
  Administered 2016-12-29: 0.5 mg via INTRAVENOUS
  Filled 2016-12-29: qty 1

## 2016-12-29 MED ORDER — MORPHINE SULFATE (PF) 4 MG/ML IV SOLN
4.0000 mg | Freq: Once | INTRAVENOUS | Status: AC
  Administered 2016-12-29: 4 mg via INTRAVENOUS
  Filled 2016-12-29: qty 1

## 2016-12-29 MED ORDER — ONDANSETRON HCL 4 MG/2ML IJ SOLN
4.0000 mg | Freq: Once | INTRAMUSCULAR | Status: AC
  Administered 2016-12-29: 4 mg via INTRAVENOUS
  Filled 2016-12-29: qty 2

## 2016-12-29 MED ORDER — OXYCODONE-ACETAMINOPHEN 5-325 MG PO TABS: 1.0000 | ORAL_TABLET | ORAL | 0 refills | Status: DC | PRN

## 2016-12-29 NOTE — ED Notes (Signed)
Patient returned from [facility].

## 2016-12-29 NOTE — ED Notes (Signed)
Patient transported to X-ray 

## 2016-12-29 NOTE — Discharge Instructions (Signed)
You do have a small amount of sludge in your gallbladder. Please return for worse pain fever vomiting. Please follow-up with [facility]. Please call them for an appointment. Also please return for any other pain in your abdomen or chest or fever vomiting or shortness of breath. If the pain returns she can try the Percocet 1 pill 4 times a day if that does not fix it return here again.

## 2016-12-29 NOTE — ED Provider Notes (Signed)
[facility] Emergency Department Provider Note   ____________________________________________   First MD Initiated Contact with Patient 12/29/16 1135     (approximate)  I have reviewed the triage vital signs and the nursing notes.   HISTORY  Chief Complaint Chest Pain    HPI Tammy Robles is a 46 y.o. female Who reports onset of sharp chest pain about 2-1/2 hour ago. Patient says she's not had anything like this before. It's worse when she sits up she is little bit nauseated but not really short of breath. On exam pain seems to be worse in the right upper quadrant to palpation.   Past Medical History:  Diagnosis Date  . Bladder prolapse, female, acquired   . Endometriosis 1996  . Endometritis 1990   fever 105 post delivery  . Polycystic ovarian disease 1993    Patient Active Problem List   Diagnosis Date Noted  . Tobacco use disorder 12/23/2016  . Bipolar 2 disorder (HCC) 12/20/2016  . Cannabis use disorder, moderate, dependence (HCC) 12/20/2016  . Migraine headache 12/20/2016  . Borderline personality disorder 11/04/2014  . PTSD (post-traumatic stress disorder) 03/29/2004    Past Surgical History:  Procedure Laterality Date  . ABDOMINAL HYSTERECTOMY    . 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 HIP ARTHROPLASTY    . hysterrectomy  1996  . LAPAROTOMY     multiple for cysts  . LEFT OOPHORECTOMY  2007  . mass off appendix    . RIGHT OOPHORECTOMY    . WRIST SURGERY  2007 and 2003   both tendon release    Prior to Admission medications   Medication Sig Start Date End Date Taking? Authorizing Provider  fluvoxaMINE (LUVOX) 50 MG tablet Take 3 tablets (150 mg total) by mouth at bedtime. 12/25/16  Yes Pucilowska, Jolanta B, MD  gabapentin (NEURONTIN) 400 MG capsule Take 1 capsule (400 mg total) by mouth 3 (three) times daily. 12/25/16  Yes Pucilowska, Jolanta B, MD  hydrOXYzine (ATARAX/VISTARIL) 25 MG  tablet Take 1 tablet (25 mg total) by mouth 3 (three) times daily as needed for anxiety. 12/25/16  Yes Pucilowska, Jolanta B, MD  lamoTRIgine (LAMICTAL) 200 MG tablet Take 1 tablet (200 mg total) by mouth at bedtime. 12/25/16  Yes Pucilowska, Jolanta B, MD  prazosin (MINIPRESS) 2 MG capsule Take 1 capsule (2 mg total) by mouth daily with breakfast. 12/26/16  Yes Pucilowska, Jolanta B, MD  prochlorperazine (COMPAZINE) 10 MG tablet Take 1 tablet (10 mg total) by mouth every 6 (six) hours as needed for nausea or vomiting. 12/25/16  Yes Pucilowska, Jolanta B, MD  QUEtiapine (SEROQUEL) 300 MG tablet Take 1 tablet (300 mg total) by mouth at bedtime. 12/25/16  Yes Pucilowska, Jolanta B, MD  oxyCODONE-acetaminophen (ROXICET) 5-325 MG tablet Take 1 tablet by mouth every 4 (four) hours as needed for severe pain. 12/29/16   Arnaldo Natal, MD    Allergies Latex; Amoxicillin; Darvocet [propoxyphene n-acetaminophen]; Eggs or egg-derived products; Gabapentin; Reglan [metoclopramide]; and Sulfa antibiotics  Family History  Problem Relation Age of Onset  . Stroke Mother   . Cancer Father     Social History Social History  Substance Use Topics  . Smoking status: Former Smoker    Packs/day: 0.50  . Smokeless tobacco: Never Used  . Alcohol use No    Review of Systems  Constitutional: No fever/chills Eyes: No visual changes. ENT: No sore throat. Cardiovascular: see history of  present illness Respiratory: ee history of present illness Gastrointestinal: see history of present illness  No diarrhea.  No constipation. Genitourinary: Negative for dysuria. Musculoskeletal: Negative for back pain. Skin: Negative for rash. Neurological: Negative for headaches, focal weakness   ____________________________________________   PHYSICAL EXAM:  VITAL SIGNS: ED Triage Vitals  Enc Vitals Group     BP 12/29/16 1128 124/68     Pulse Rate 12/29/16 1128 89     Resp 12/29/16 1128 (!) 22     Temp 12/29/16 1128  98.9 F (37.2 C)     Temp Source 12/29/16 1128 Oral     SpO2 12/29/16 1128 98 %     Weight 12/29/16 1125 275 lb (124.7 kg)     Height 12/29/16 1125 5' 7.5" (1.715 m)     Head Circumference --      Peak Flow --      Pain Score 12/29/16 1125 7     Pain Loc --      Pain Edu? --      Excl. in GC? --     Constitutional: Alert and oriented. Well appearing and in no acute distress. Eyes: Conjunctivae are normal. Head: Atraumatic. Nose: No congestion/rhinnorhea. Mouth/Throat: Mucous membranes are moist.  Oropharynx non-erythematous. Neck: No stridor Cardiovascular: Normal rate, regular rhythm. Grossly normal heart sounds.  Good peripheral circulation. Respiratory: Normal respiratory effort.  No retractions. Lungs CTAB. Gastrointestinal: Soft tender to palpation especially in the right upper quadrant pain there is worse with deep breathing as well. No distention. No abdominal bruits. No CVA tenderness. Musculoskeletal: No lower extremity tenderness nor edema.  No joint effusions. Neurologic:  Normal speech and language. No gross focal neurologic deficits are appreciated. No gait instability. Skin:  Skin is warm, dry and intact. No rash noted. Psychiatric: Mood and affect are normal. Speech and behavior are normal.  ____________________________________________   LABS (all labs ordered are listed, but only abnormal results are displayed)  Labs Reviewed  BASIC METABOLIC PANEL - Abnormal; Notable for the following:       Result Value   Glucose, Bld 128 (*)    All other components within normal limits  HEPATIC FUNCTION PANEL - Abnormal; Notable for the following:    Bilirubin, Direct <0.1 (*)    All other components within normal limits  CBC  TROPONIN I  LIPASE, BLOOD   ____________________________________________  EKG  EKG read and interpreted by me shows normal sinus rhythm rate of 91 normal axis nonspecific ST-T wave  changes ____________________________________________  RADIOLOGY  Dg Chest 2 View  Result Date: 12/29/2016 CLINICAL DATA:  Mid chest pain, dizziness, and diaphoresis associated with nausea and vomiting for the past 2 hours. Symptoms may be related anxiety according to the patient. Former smoker. EXAM: CHEST  2 VIEW COMPARISON:  Chest x-ray dated August 25, 2014 FINDINGS: The lungs are adequately inflated. The interstitial markings are coarse though stable. There is no alveolar infiltrate. There is no pleural effusion or pneumothorax. The heart and pulmonary vascularity are normal. The mediastinum is normal in width. The bony thorax exhibits no acute abnormality. IMPRESSION: Chronic bronchitic -smoking related changes, stable. There is no acute cardiopulmonary abnormality. Electronically Signed   By: David  [place] M.D.   On: 12/29/2016 11:55   US Abdomen Limited Ruq  Result Date: 12/29/2016 CLINICAL DATA:  Right upper quadrant pain. EXAM: ULTRASOUND ABDOMEN LIMITED RIGHT UPPER QUADRANT COMPARISON:  None. FINDINGS: Gallbladder: There may be minimal sludge in the gallbladder. No stones, wall thickening, pericholecystic fluid, or  Murphy's sign. Common bile duct: Diameter: 3 mm Liver: Probable hepatic steatosis. No focal mass. Portal vein is patent on color Doppler imaging with normal direction of blood flow towards the liver. IMPRESSION: 1. Mild sludge is suspected within the gallbladder without wall thickening, pericholecystic fluid, or Murphy's sign. 2. Probable mild hepatic steatosis. Electronically Signed   By: Gerome Sam III M.D   On: 12/29/2016 13:23   IMPRESSION: 1. Mild sludge is suspected within the gallbladder without wall thickening, pericholecystic fluid, or Murphy's sign. 2. Probable mild hepatic steatosis.   Electronically Signed   By: Gerome Sam III M.D   On: 12/29/2016 13:23  ____________________________________________   PROCEDURES  Procedure(s)  performed:  Procedures    ____________________________________________   INITIAL IMPRESSION / ASSESSMENT AND PLAN / ED COURSE  Pertinent labs & imaging results that were available during my care of the patient were reviewed by me and considered in my medical decision making (see chart for details).    patient feels better after pain medication. Patient does not have any pain in the chest per se it's all on the upper abdomen especially in the right upper quadrant. However after the pain medicine the pain is gone. I discussed her with surgery on-call he will follow her up in the office.   ____________________________________________   FINAL CLINICAL IMPRESSION(S) / ED DIAGNOSES  Final diagnoses:  Right upper quadrant abdominal pain      NEW MEDICATIONS STARTED DURING THIS VISIT:  New Prescriptions   OXYCODONE-ACETAMINOPHEN (ROXICET) 5-325 MG TABLET    Take 1 tablet by mouth every 4 (four) hours as needed for severe pain.     Note:  This document was prepared using Dragon voice recognition software and may include unintentional dictation errors.    Arnaldo Natal, MD 12/29/16 [phone]

## 2016-12-29 NOTE — ED Triage Notes (Signed)
Pt comes into the ED via [facility] from home c/o chest pain to the mid chest that is described as "my chest caving in".  Patient has h/o anxiety and was recently treated for this due to increase in stress with the loss of her mother.  Patient is alert and oriented x4.  States she was dizzy and nauseas with the chest pain.  H/o arrhythmias in the past.  VS stable, respiratory rate even and unlabored.  324 as given in route with EMS.

## 2017-01-02 DIAGNOSIS — E663 Overweight: Secondary | ICD-10-CM | POA: Insufficient documentation

## 2017-01-26 ENCOUNTER — Emergency Department
Admission: EM | Admit: 2017-01-26 | Discharge: 2017-01-26 | Disposition: A | Discharge status: Home / Self Care | Payer: Self-pay | Attending: Emergency Medicine | Admitting: Emergency Medicine

## 2017-01-26 ENCOUNTER — Emergency Department: Payer: Self-pay

## 2017-01-26 DIAGNOSIS — R110 Nausea: Secondary | ICD-10-CM | POA: Insufficient documentation

## 2017-01-26 DIAGNOSIS — Z91040 Latex allergy status: Secondary | ICD-10-CM | POA: Insufficient documentation

## 2017-01-26 DIAGNOSIS — Z79899 Other long term (current) drug therapy: Secondary | ICD-10-CM | POA: Insufficient documentation

## 2017-01-26 DIAGNOSIS — R0602 Shortness of breath: Secondary | ICD-10-CM | POA: Insufficient documentation

## 2017-01-26 DIAGNOSIS — Z87891 Personal history of nicotine dependence: Secondary | ICD-10-CM | POA: Insufficient documentation

## 2017-01-26 DIAGNOSIS — I8001 Phlebitis and thrombophlebitis of superficial vessels of right lower extremity: Secondary | ICD-10-CM | POA: Insufficient documentation

## 2017-01-26 MED ORDER — HYDROCODONE-ACETAMINOPHEN 5-325 MG PO TABS: 1.0000 | ORAL_TABLET | ORAL | 0 refills | Status: DC | PRN

## 2017-01-26 MED ORDER — HYDROCODONE-ACETAMINOPHEN 5-325 MG PO TABS
2.0000 | ORAL_TABLET | Freq: Once | ORAL | Status: AC
  Administered 2017-01-26: 2 via ORAL

## 2017-01-26 MED ORDER — HYDROCODONE-ACETAMINOPHEN 5-325 MG PO TABS
ORAL_TABLET | ORAL | Status: AC
  Filled 2017-01-26: qty 2

## 2017-01-26 MED ORDER — ONDANSETRON 4 MG PO TBDP
ORAL_TABLET | ORAL | Status: AC
  Filled 2017-01-26: qty 1

## 2017-01-26 MED ORDER — IBUPROFEN 400 MG PO TABS
600.0000 mg | ORAL_TABLET | Freq: Once | ORAL | Status: AC
  Administered 2017-01-26: 600 mg via ORAL
  Filled 2017-01-26: qty 2

## 2017-01-26 MED ORDER — ONDANSETRON 4 MG PO TBDP
4.0000 mg | ORAL_TABLET | Freq: Once | ORAL | Status: AC
  Administered 2017-01-26: 4 mg via ORAL

## 2017-01-26 MED ORDER — IBUPROFEN 600 MG PO TABS: ORAL_TABLET | ORAL | 1 refills | Status: DC

## 2017-01-26 MED ORDER — ONDANSETRON 4 MG PO TBDP: ORAL_TABLET | ORAL | 0 refills | Status: DC

## 2017-01-26 NOTE — ED Notes (Signed)
Per dr - v.o. For [place] only - no blood work at this time.

## 2017-01-26 NOTE — ED Provider Notes (Signed)
[facility] Emergency Department Provider Note  ____________________________________________   First MD Initiated Contact with Patient 01/26/17 1441     (approximate)  I have reviewed the triage vital signs and the nursing notes.   HISTORY  Chief Complaint Leg Pain; Shortness of Breath; and Nausea    HPI Tammy Robles is a 46 y.o. female with past medical history an active problem list as listed below who presents for evaluation of gradually worsening swelling, redness, and pain in the upper part of her right calf.  This is been going on for several days.  Nothing in particular makes the patient's symptoms better nor worse.    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  She states that last night she had some swelling in her feet but that has completely resolved and now it is just an area on the inner part of her upper right calf.  He states the pain is severe and radiates up her leg but there is no redness or streaking that is radiating away from the area. she states that it makes her nauseated as well but she has not had any vomiting.  She denies fever/chills, chest pain, shortness of breath, abdominal pain, and dysuria.  She has no history of blood clots in her legs nor lungs.  She has had extensive orthopedic issues in her right hip.  She is not on exogenous estrogen.   Past Medical History:  Diagnosis Date  . Bladder prolapse, female, acquired   . Endometriosis 1996  . Endometritis 1990   fever 105 post delivery  . Polycystic ovarian disease 1993    Patient Active Problem List   Diagnosis Date Noted  . Tobacco use disorder 12/23/2016  . Bipolar 2 disorder (HCC) 12/20/2016  . Cannabis use disorder, moderate, dependence (HCC) 12/20/2016  . Migraine headache 12/20/2016  . Borderline personality disorder 11/04/2014  . PTSD (post-traumatic stress disorder) 03/29/2004    Past Surgical  History:  Procedure Laterality Date  . ABDOMINAL HYSTERECTOMY    . 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 HIP ARTHROPLASTY    . hysterrectomy  1996  . LAPAROTOMY     multiple for cysts  . LEFT OOPHORECTOMY  2007  . mass off appendix    . RIGHT OOPHORECTOMY    . WRIST SURGERY  2007 and 2003   both tendon release    Prior to Admission medications   Medication Sig Start Date End Date Taking? Authorizing Provider  fluvoxaMINE (LUVOX) 50 MG tablet Take 3 tablets (150 mg total) by mouth at bedtime. 12/25/16   Pucilowska, Braulio Conte B, MD  gabapentin (NEURONTIN) 400 MG capsule Take 1 capsule (400 mg total) by mouth 3 (three) times daily. 12/25/16   Pucilowska, Braulio Conte B, MD  HYDROcodone-acetaminophen (NORCO/VICODIN) 5-325 MG tablet Take 1-2 tablets by mouth every 4 (four) hours as needed for moderate pain. 01/26/17   Loleta Rose, MD  hydrOXYzine (ATARAX/VISTARIL) 25 MG tablet Take 1 tablet (25 mg total) by mouth 3 (three) times daily as needed for anxiety. 12/25/16   Pucilowska, Ellin Goodie, MD  ibuprofen (ADVIL,MOTRIN) 600 MG tablet Take 1 tablet by mouth three times daily with meals 01/26/17   Loleta Rose, MD  lamoTRIgine (LAMICTAL) 200 MG tablet Take 1 tablet (200 mg total) by mouth at bedtime. 12/25/16   Pucilowska, Ellin Goodie, MD  ondansetron (ZOFRAN ODT)  4 MG disintegrating tablet Allow 1-2 tablets to dissolve in your mouth every 8 hours as needed for nausea/vomiting 01/26/17   Loleta Rose, MD  oxyCODONE-acetaminophen (ROXICET) 5-325 MG tablet Take 1 tablet by mouth every 4 (four) hours as needed for severe pain. 12/29/16   Arnaldo Natal, MD  prazosin (MINIPRESS) 2 MG capsule Take 1 capsule (2 mg total) by mouth daily with breakfast. 12/26/16   Pucilowska, Jolanta B, MD  prochlorperazine (COMPAZINE) 10 MG tablet Take 1 tablet (10 mg total) by mouth every 6 (six) hours as needed for nausea or vomiting. 12/25/16   Pucilowska, Jolanta B, MD  QUEtiapine (SEROQUEL)  300 MG tablet Take 1 tablet (300 mg total) by mouth at bedtime. 12/25/16   Pucilowska, Ellin Goodie, MD    Allergies Latex; Amoxicillin; Darvocet [propoxyphene n-acetaminophen]; Eggs or egg-derived products; Gabapentin; Reglan [metoclopramide]; and Sulfa antibiotics  Family History  Problem Relation Age of Onset  . Stroke Mother   . Cancer Father     Social History Social History  Substance Use Topics  . Smoking status: Former Smoker    Packs/day: 0.50  . Smokeless tobacco: Never Used  . Alcohol use No    Review of Systems Constitutional: No fever/chills Eyes: No visual changes. ENT: No sore throat. Cardiovascular: Denies chest pain. Respiratory: Denies shortness of breath. Gastrointestinal: No abdominal pain.  Nausea, no vomiting.  No diarrhea.  No constipation. Genitourinary: Negative for dysuria. Musculoskeletal: pain and right lower leg for several days.  Negative for neck pain.  Negative for back pain. Integumentary: area of redness and swelling in her right calf for a few days, warm to the touch Neurological: Negative for headaches, focal weakness or numbness.   ____________________________________________   PHYSICAL EXAM:  VITAL SIGNS: ED Triage Vitals  Enc Vitals Group     BP 01/26/17 1252 117/66     Pulse Rate 01/26/17 1252 92     Resp 01/26/17 1252 18     Temp 01/26/17 1252 98.7 F (37.1 C)     Temp Source 01/26/17 1252 Oral     SpO2 01/26/17 1252 97 %     Weight 01/26/17 1252 122.9 kg (271 lb)     Height 01/26/17 1252 1.715 m (5' 7.5")     Head Circumference --      Peak Flow --      Pain Score 01/26/17 1251 6     Pain Loc --      Pain Edu? --      Excl. in GC? --     Constitutional: Alert and oriented. Well appearing and in no acute distress. Eyes: Conjunctivae are normal.  Head: Atraumatic. Nose: No congestion/rhinnorhea. Mouth/Throat: Mucous membranes are moist. Neck: No stridor.  No meningeal signs.   Cardiovascular: Normal rate, regular  rhythm. Good peripheral circulation. Grossly normal heart sounds. Respiratory: Normal respiratory effort.  No retractions. Lungs CTAB. Gastrointestinal: Soft and nontender. No distention.  Musculoskeletal: No lower extremity tenderness nor edema. No gross deformities of extremities. Neurologic:  Normal speech and language. No gross focal neurologic deficits are appreciated.  Psychiatric: Mood and affect are normal. Speech and behavior are normal. Skin:  circular area of erythema on the proximal medial right lower extremity that is approximately 7-cm in diameter and corresponds to an area of superficial thrombophlebitis seen on ultrasound.  There is no streaking redness extending away from the area which would suggest spreading cellulitis.  There is no induration and no fluctuance.  See image below:  ____________________________________________   LABS (all labs ordered are listed, but only abnormal results are displayed)  Labs Reviewed - No data to display ____________________________________________  EKG  None - EKG not ordered by ED physician ____________________________________________  RADIOLOGY   US Venous Img Lower Unilateral Right  Result Date: 01/26/2017 CLINICAL DATA:  Right calf erythema. EXAM: RIGHT LOWER EXTREMITY VENOUS DOPPLER ULTRASOUND TECHNIQUE: Gray-scale sonography with graded compression, as well as color Doppler and duplex ultrasound were performed to evaluate the lower extremity deep venous systems from the level of the common femoral vein and including the common femoral, femoral, profunda femoral, popliteal and calf veins including the posterior tibial, peroneal and gastrocnemius veins when visible. The superficial great saphenous vein was also interrogated. Spectral Doppler was utilized to evaluate flow at rest and with distal augmentation maneuvers in the common femoral, femoral and popliteal veins. COMPARISON:  None. FINDINGS: Contralateral Common  Femoral Vein: Respiratory phasicity is normal and symmetric with the symptomatic side. No evidence of thrombus. Normal compressibility. Common Femoral Vein: No evidence of thrombus. Normal compressibility, respiratory phasicity and response to augmentation. Saphenofemoral Junction: No evidence of thrombus. Normal compressibility and flow on color Doppler imaging. Profunda Femoral Vein: No evidence of thrombus. Normal compressibility and flow on color Doppler imaging. Femoral Vein: No evidence of thrombus. Normal compressibility, respiratory phasicity and response to augmentation. Popliteal Vein: No evidence of thrombus. Normal compressibility, respiratory phasicity and response to augmentation. Calf Veins: No evidence of thrombus. Normal compressibility and flow on color Doppler imaging. Superficial Great Saphenous Vein: No evidence of thrombus. Normal compressibility and flow on color Doppler imaging. Venous Reflux:  None. Other Findings: Multiple thrombosed varicosities are noted in the posterior right calf, possibly including the small saphenous vein. IMPRESSION: 1. No evidence of DVT within the right lower extremity. 2. Superficial thrombophlebitis involving multiple varicosities in the posterior right calf, possibly including the small saphenous vein. Electronically Signed   By: Obie Dredge M.D.   On: 01/26/2017 13:59    ____________________________________________   PROCEDURES  Critical Care performed: No   Procedure(s) performed:   Procedures   ____________________________________________   INITIAL IMPRESSION / ASSESSMENT AND PLAN / ED COURSE  Pertinent labs & imaging results that were available during my care of the patient were reviewed by me and considered in my medical decision making (see chart for details).  the patient received a lower extremity ultrasound while awaiting in exam room and I reviewed the report. it is negative for DVT but shows evidence of superficial  thrombophlebitis which is consistent with her physical exam.  The differential diagnosis is most notable for DVT ( ruled out by the ultrasound) and cellulitis, but she has no other systemic symptoms.  I have paged Dr. Gilda Crease to discuss the plan which I anticipate will include NSAIDs, cold versus warm compresses, and follow up in the vascular surgery clinic, as well as pain medication and antiemetics as needed.  I reviewed the patient's prescription history over the last 12 months in the multi-state controlled substances database(s) that includes [place], [place], [place], [place], [place], [place], [place], [place], [place], [place], [place], [place], [place], and [place].  The patient has filled no controlled substances during that time.   Clinical Course as of Jan 27 1631  Thu Jan 26, 2017  1536 No responsive from Dr. Gilda Crease, have paged again.  I am treating the patient's acute pain with 2 Norco and her nausea with Zofran, awaiting additional recommendations from vascular surgery.  [CF]  1600  Spoke by phone with Dr. Gilda Crease Who advised ibuprofen, elevation, compression stockings as soon as possible, and either warm or cold compresses, whichever makes the patient feel better.  He recommended a vascular surgery follow-up next week but also advised me to let the patient know that this can take weeks to resolve.  [CF]    Clinical Course User Index [CF] Loleta Rose, MD    ____________________________________________  FINAL CLINICAL IMPRESSION(S) / ED DIAGNOSES  Final diagnoses:  Thrombophlebitis of superficial veins of right lower extremity     MEDICATIONS GIVEN DURING THIS VISIT:  Medications  HYDROcodone-acetaminophen (NORCO/VICODIN) 5-325 MG per tablet 2 tablet (2 tablets Oral Given 01/26/17 1524)  ondansetron (ZOFRAN-ODT) disintegrating tablet 4 mg (4 mg Oral Given 01/26/17 1524)  ibuprofen (ADVIL,MOTRIN) tablet 600 mg (600 mg Oral Given 01/26/17  1610)     NEW OUTPATIENT MEDICATIONS STARTED DURING THIS VISIT:  New Prescriptions   HYDROCODONE-ACETAMINOPHEN (NORCO/VICODIN) 5-325 MG TABLET    Take 1-2 tablets by mouth every 4 (four) hours as needed for moderate pain.   IBUPROFEN (ADVIL,MOTRIN) 600 MG TABLET    Take 1 tablet by mouth three times daily with meals   ONDANSETRON (ZOFRAN ODT) 4 MG DISINTEGRATING TABLET    Allow 1-2 tablets to dissolve in your mouth every 8 hours as needed for nausea/vomiting    Modified Medications   No medications on file    Discontinued Medications   No medications on file     Note:  This document was prepared using Dragon voice recognition software and may include unintentional dictation errors.    Loleta Rose, MD 01/26/17 [phone]

## 2017-01-26 NOTE — ED Notes (Signed)
Pt to ed with c/o right lower leg pain and swelling and warm to touch.

## 2017-01-26 NOTE — Discharge Instructions (Signed)
As we discussed, you have inflammation of the blood vessels in your leg.  Please read through the RICE instructions, because the recommendations for Rest, Ice (or heating pads), Compression, and Elevation all apply to your condition.  As soon as you can start wearing a compression stocking, we recommend that you do so.  Take ibuprofen 600 mg three times daily with meals, at least until you follow up with Dr. Gilda Crease in the vascular surgery clinic.    Take Norco as prescribed for severe pain. Do not drink alcohol, drive or participate in any other potentially dangerous activities while taking this medication as it may make you sleepy. Do not take this medication with any other sedating medications, either prescription or over-the-counter. If you were prescribed Percocet or Vicodin, do not take these with acetaminophen (Tylenol) as it is already contained within these medications.   This medication is an opiate (or narcotic) pain medication and can be habit forming.  Use it as little as possible to achieve adequate pain control.  Do not use or use it with extreme caution if you have a history of opiate abuse or dependence.  If you are on a pain contract with your primary care doctor or a pain specialist, be sure to let them know you were prescribed this medication today from the [facility] Emergency Department.  This medication is intended for your use only - do not give any to anyone else and keep it in a secure place where nobody else, especially children, have access to it.  It will also cause or worsen constipation, so you may want to consider taking an over-the-counter stool softener while you are taking this medication.    Return to the emergency department if you develop new or worsening symptoms that concern you.

## 2017-01-27 ENCOUNTER — Other Ambulatory Visit: Payer: Self-pay

## 2017-01-27 ENCOUNTER — Encounter: Payer: Self-pay | Admitting: Emergency Medicine

## 2017-01-27 ENCOUNTER — Emergency Department: Payer: Self-pay

## 2017-01-27 ENCOUNTER — Emergency Department
Admission: EM | Admit: 2017-01-27 | Discharge: 2017-01-27 | Disposition: A | Discharge status: Home / Self Care | Payer: Self-pay | Attending: Emergency Medicine | Admitting: Emergency Medicine

## 2017-01-27 DIAGNOSIS — R079 Chest pain, unspecified: Secondary | ICD-10-CM | POA: Insufficient documentation

## 2017-01-27 DIAGNOSIS — F419 Anxiety disorder, unspecified: Secondary | ICD-10-CM | POA: Insufficient documentation

## 2017-01-27 LAB — CBC
HEMATOCRIT: 37.6 % (ref 35.0–47.0)
Hemoglobin: 12.7 g/dL (ref 12.0–16.0)
MCH: 30 pg (ref 26.0–34.0)
MCHC: 33.7 g/dL (ref 32.0–36.0)
MCV: 89.1 fL (ref 80.0–100.0)
Platelets: 364 10*3/uL (ref 150–440)
RBC: 4.22 MIL/uL (ref 3.80–5.20)
RDW: 13.5 % (ref 11.5–14.5)
WBC: 6.2 10*3/uL (ref 3.6–11.0)

## 2017-01-27 LAB — BASIC METABOLIC PANEL
Anion gap: 11 (ref 5–15)
BUN: 15 mg/dL (ref 6–20)
CHLORIDE: 100 mmol/L — AB (ref 101–111)
CO2: 25 mmol/L (ref 22–32)
Calcium: 9.4 mg/dL (ref 8.9–10.3)
Creatinine, Ser: 1 mg/dL (ref 0.44–1.00)
GFR calc non Af Amer: >60 mL/min (ref >60)
Glucose, Bld: 111 mg/dL — ABNORMAL HIGH (ref 65–99)
POTASSIUM: 4 mmol/L (ref 3.5–5.1)
SODIUM: 136 mmol/L (ref 135–145)

## 2017-01-27 LAB — TROPONIN I: Troponin I: <0.03 ng/mL (ref <0.03)

## 2017-01-27 MED ORDER — LORAZEPAM 1 MG PO TABS
1.0000 mg | ORAL_TABLET | Freq: Once | ORAL | Status: AC
  Administered 2017-01-27: 1 mg via ORAL

## 2017-01-27 MED ORDER — LORAZEPAM 1 MG PO TABS: 1.0000 mg | ORAL_TABLET | Freq: Four times a day (QID) | ORAL | 0 refills | Status: DC | PRN

## 2017-01-27 MED ORDER — LORAZEPAM 1 MG PO TABS
ORAL_TABLET | ORAL | Status: AC
  Filled 2017-01-27: qty 1

## 2017-01-27 NOTE — ED Notes (Signed)
Left sided chest pain that radiates up the left arm. PT tearful. States that she does not feel well. Dx with "thrombitis" yesterday to right calf.

## 2017-01-27 NOTE — ED Triage Notes (Signed)
Pt to ED from home c/o chest pain. Pt states that she has been having chest pain for the past couple of days. Pt states that she was seen in our ED yesterday and diagnosed with phlebitis. Pt states that the chest pain got worse today and she tried to get in with her PCP but was told to come back to the ED if she was having chest pain. Pt states that she has had nausea but no vomiting and doesn't feel well. Pt does not appear to be in any distress at this time.

## 2017-01-27 NOTE — Discharge Instructions (Signed)
Please make an appointment with your primary care physician for re-evaluation of your chest pain and anxiety.  Return to the emergency department for severe pain, shortness of breath, palpitations or lightheadedness, if you become overwhelmed or begin to have thoughts of hurting yourself or anyone else, or for any other symptoms concerning to you.

## 2017-01-27 NOTE — ED Provider Notes (Signed)
[facility] Emergency Department Provider Note  ____________________________________________  Time seen: Approximately 9:00 PM  I have reviewed the triage vital signs and the nursing notes.   HISTORY  Chief Complaint Chest Pain    HPI Tammy Robles is a 46 y.o. female with a history of anxiety and bipolar disorder, borderline personality d/o, ongoing tobacco abuse,presenting withchest pain that is typical of her panic attacks. The patient reports years of intermittent sharp CP episodes due to anxiety.  Today in the early afternoon, she was feeling sign stress when she developed a nonpleuritic sharp central cp w/o assoc sob, n/v, palpitations, lightheadedness or syncope.  It lasted 15 mins and self resolved when she was able to calm down.  She usually takes Ativan for her anxiety, but her prescription is at someone else's home.  She denies any recent illness, LE swelling or calf pain.   Past Medical History:  Diagnosis Date  . Bladder prolapse, female, acquired   . Endometriosis 1996  . Endometritis 1990   fever 105 post delivery  . Polycystic ovarian disease 1993    Patient Active Problem List   Diagnosis Date Noted  . Tobacco use disorder 12/23/2016  . Bipolar 2 disorder (HCC) 12/20/2016  . Cannabis use disorder, moderate, dependence (HCC) 12/20/2016  . Migraine headache 12/20/2016  . Borderline personality disorder 11/04/2014  . PTSD (post-traumatic stress disorder) 03/29/2004    Past Surgical History:  Procedure Laterality Date  . ABDOMINAL HYSTERECTOMY    . 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 HIP ARTHROPLASTY    . hysterrectomy  1996  . LAPAROTOMY     multiple for cysts  . LEFT OOPHORECTOMY  2007  . mass off appendix    . RIGHT OOPHORECTOMY    . WRIST SURGERY  2007 and 2003   both tendon release    Current Outpatient Rx  . Order #: [id number] Class: Print  . Order #: [id number] Class: Print  .  Order #: [id number] Class: Print  . Order #: [id number] Class: Print  . Order #: [id number] Class: Print  . Order #: [id number] Class: Print  . Order #: [id number] Class: Print  . Order #: [id number] Class: Print  . Order #: [id number] Class: Print  . Order #: [id number] Class: Print  . Order #: [id number] Class: Print  . Order #: [id number] Class: Print    Allergies Latex; Amoxicillin; Darvocet [propoxyphene n-acetaminophen]; Eggs or egg-derived products; Gabapentin; Reglan [metoclopramide]; and Sulfa antibiotics  Family History  Problem Relation Age of Onset  . Stroke Mother   . Cancer Father     Social History Social History  Substance Use Topics  . Smoking status: Former Smoker    Packs/day: 0.50  . Smokeless tobacco: Never Used  . Alcohol use No    Review of Systems Constitutional: No fever/chills.  No lightheadedness or syncope. Eyes: No visual changes. ENT: No sore throat. No congestion or rhinorrhea. Cardiovascular: + chest pain. Denies palpitations. Respiratory: Denies shortness of breath.  No cough. Gastrointestinal: No abdominal pain.  No nausea, no vomiting.  No diarrhea.  No constipation. Genitourinary: Negative for dysuria. Musculoskeletal: Negative for back pain. Skin: Negative for rash. Neurological: Negative for headaches. No focal numbness, tingling or weakness.  Psychiatric:+ anxiety    ____________________________________________   PHYSICAL EXAM:  VITAL SIGNS: ED Triage Vitals [01/27/17 1825]  Enc Vitals Group     BP 117/68     Pulse Rate 85     Resp 16     Temp  98.2 F (36.8 C)     Temp Source Oral     SpO2 95 %     Weight      Height      Head Circumference      Peak Flow      Pain Score 7     Pain Loc      Pain Edu?      Excl. in GC?     Constitutional: Alert and oriented. Chronically ill appearing but in no acute distress. Answers questions appropriately. Eyes: Conjunctivae are normal.  EOMI. No scleral icterus. Head: Atraumatic. Nose: No  congestion/rhinnorhea. Mouth/Throat: Mucous membranes are moist.  Neck: No stridor.  Supple.  No JVD. Cardiovascular: Normal rate, regular rhythm. No murmurs, rubs or gallops.  Respiratory: Normal respiratory effort.  No accessory muscle use or retractions. Lungs CTAB.  No wheezes, rales or ronchi. Gastrointestinal: Obese. Soft, nontender and nondistended.  No guarding or rebound.  No peritoneal signs. Musculoskeletal: No LE edema. No ttp in the calves or palpable cords.  Negative Homan's sign. Neurologic:  A&Ox3.  Speech is clear.  Face and smile are symmetric.  EOMI.  Moves all extremities well. Skin:  Skin is warm, dry and intact. No rash noted. Psychiatric: Depressed mood with anxious affect but normal speech and exhibits good insight. ____________________________________________   LABS (all labs ordered are listed, but only abnormal results are displayed)  Labs Reviewed  BASIC METABOLIC PANEL - Abnormal; Notable for the following:       Result Value   Chloride 100 (*)    Glucose, Bld 111 (*)    All other components within normal limits  CBC  TROPONIN I   ____________________________________________  EKG  ED ECG REPORT I, Rockne Menghini, the attending physician, personally viewed and interpreted this ECG.   Date: 01/27/2017  EKG Time: 1822  Rate: 84  Rhythm: normal sinus rhythm  Axis: normal  Intervals:none  ST&T Change: No STEMI  ____________________________________________  RADIOLOGY  Dg Chest 2 View  Result Date: 01/27/2017 CLINICAL DATA:  Chest pain EXAM: CHEST  2 VIEW COMPARISON:  Chest x-ray dated 12/29/2016. FINDINGS: Heart size and mediastinal contours are normal. Lungs are clear. No pleural effusion or pneumothorax seen. No acute or suspicious osseous finding. IMPRESSION: No active cardiopulmonary disease. No evidence of pneumonia or pulmonary edema Electronically Signed   By: Bary Richard M.D.   On: 01/27/2017 19:01     ____________________________________________   PROCEDURES  Procedure(s) performed: None  Procedures  Critical Care performed: No ____________________________________________   INITIAL IMPRESSION / ASSESSMENT AND PLAN / ED COURSE  Pertinent labs & imaging results that were available during my care of the patient were reviewed by me and considered in my medical decision making (see chart for details).  46 y.o. F w/ hx of anxiety presenting w/ CP typical of her anxiety attacks.  The pt has not ever had a risk stratification study.  It is unlikely that this CP is due to ACS or MI and her ED w/u is reassuring with an EKG w/o ischemic changes and a negative trop, no cardiopulm abnormalities on her CXR.  She is asymptomatic at this time.  Dr. Joana Reamer will see her in clinic for outpatient cardiology eval and I have encouraged her to make an appt w/ her PMD for anxiety re-evaluation.  Return precautions were discussed.  ____________________________________________  FINAL CLINICAL IMPRESSION(S) / ED DIAGNOSES  Final diagnoses:  Chest pain, unspecified type  Anxiety  NEW MEDICATIONS STARTED DURING THIS VISIT:  New Prescriptions   LORAZEPAM (ATIVAN) 1 MG TABLET    Take 1 tablet (1 mg total) by mouth every 6 (six) hours as needed for anxiety.      Rockne Menghini, MD 01/27/17 2110

## 2017-02-23 ENCOUNTER — Emergency Department
Admission: EM | Admit: 2017-02-23 | Discharge: 2017-02-23 | Disposition: A | Discharge status: Home / Self Care | Payer: Self-pay | Attending: Emergency Medicine | Admitting: Emergency Medicine

## 2017-02-23 ENCOUNTER — Emergency Department: Payer: Self-pay

## 2017-02-23 DIAGNOSIS — R519 Headache, unspecified: Secondary | ICD-10-CM

## 2017-02-23 DIAGNOSIS — F17200 Nicotine dependence, unspecified, uncomplicated: Secondary | ICD-10-CM | POA: Insufficient documentation

## 2017-02-23 DIAGNOSIS — F419 Anxiety disorder, unspecified: Secondary | ICD-10-CM

## 2017-02-23 DIAGNOSIS — Z79899 Other long term (current) drug therapy: Secondary | ICD-10-CM | POA: Insufficient documentation

## 2017-02-23 DIAGNOSIS — Z91040 Latex allergy status: Secondary | ICD-10-CM | POA: Insufficient documentation

## 2017-02-23 DIAGNOSIS — R51 Headache: Secondary | ICD-10-CM | POA: Insufficient documentation

## 2017-02-23 HISTORY — DX: Benign intracranial hypertension: G93.2

## 2017-02-23 LAB — COMPREHENSIVE METABOLIC PANEL
ALT: 11 U/L — AB (ref 14–54)
ANION GAP: 9 (ref 5–15)
AST: 20 U/L (ref 15–41)
Albumin: 4.4 g/dL (ref 3.5–5.0)
Alkaline Phosphatase: 88 U/L (ref 38–126)
BUN: 11 mg/dL (ref 6–20)
CHLORIDE: 106 mmol/L (ref 101–111)
CO2: 26 mmol/L (ref 22–32)
CREATININE: 1.12 mg/dL — AB (ref 0.44–1.00)
Calcium: 9.7 mg/dL (ref 8.9–10.3)
GFR calc non Af Amer: 58 mL/min — ABNORMAL LOW (ref >60)
Glucose, Bld: 97 mg/dL (ref 65–99)
Potassium: 3.5 mmol/L (ref 3.5–5.1)
SODIUM: 141 mmol/L (ref 135–145)
Total Bilirubin: 0.4 mg/dL (ref 0.3–1.2)
Total Protein: 8.1 g/dL (ref 6.5–8.1)

## 2017-02-23 LAB — SALICYLATE LEVEL: Salicylate Lvl: <7 mg/dL (ref 2.8–30.0)

## 2017-02-23 LAB — CBC
HEMATOCRIT: 39.7 % (ref 35.0–47.0)
Hemoglobin: 13.1 g/dL (ref 12.0–16.0)
MCH: 29.4 pg (ref 26.0–34.0)
MCHC: 33.1 g/dL (ref 32.0–36.0)
MCV: 88.8 fL (ref 80.0–100.0)
PLATELETS: 300 10*3/uL (ref 150–440)
RBC: 4.47 MIL/uL (ref 3.80–5.20)
RDW: 13.5 % (ref 11.5–14.5)
WBC: 7.9 10*3/uL (ref 3.6–11.0)

## 2017-02-23 LAB — URINE DRUG SCREEN, QUALITATIVE (ARMC ONLY)
Amphetamines, Ur Screen: NOT DETECTED
BARBITURATES, UR SCREEN: NOT DETECTED
BENZODIAZEPINE, UR SCRN: NOT DETECTED
Cannabinoid 50 Ng, Ur ~~LOC~~: POSITIVE — AB
Cocaine Metabolite,Ur ~~LOC~~: NOT DETECTED
MDMA (Ecstasy)Ur Screen: NOT DETECTED
Methadone Scn, Ur: NOT DETECTED
OPIATE, UR SCREEN: NOT DETECTED
PHENCYCLIDINE (PCP) UR S: NOT DETECTED
Tricyclic, Ur Screen: POSITIVE — AB

## 2017-02-23 LAB — ETHANOL: Alcohol, Ethyl (B): <10 mg/dL (ref <10)

## 2017-02-23 LAB — ACETAMINOPHEN LEVEL

## 2017-02-23 MED ORDER — TRAMADOL HCL 50 MG PO TABS
50.0000 mg | ORAL_TABLET | Freq: Once | ORAL | Status: AC
Start: 2017-02-23 — End: 2017-02-23
  Administered 2017-02-23: 50 mg via ORAL
  Filled 2017-02-23: qty 1

## 2017-02-23 NOTE — ED Notes (Signed)

## 2017-02-23 NOTE — ED Notes (Signed)
Patient states she came to ER tonight for a migraine headache she has had for the last four days.  She has a hx of migraines but "this is a bad one".  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  She states she does have some other help so she does not feel overburdened but she has not taken her Lamictal for the last week and states she feels she needs to start taking better care of herself.  She has a hx of DiD and BPD.  Patient is calm and cooperative and asking for something to eat/drink.

## 2017-02-23 NOTE — ED Triage Notes (Signed)
Patient c/o migraine X 4 days. Patient reports hx of idiopathic intracranial hypertension.  Patient c/o bilateral thigh pain. Patient reports she has not been taking her lamictal, and is out of several medications.  Patient denies SI, however reports increased depression.

## 2017-02-23 NOTE — ED Notes (Signed)
Patient given belongings to dress before dc.  Patient has contacted friend to pick her up, and will be here in ~10 minutes.

## 2017-02-23 NOTE — ED Provider Notes (Addendum)
[facility] Provider [facility]  ____________________________________________   I have reviewed the triage vital signs and the nursing notes.   HISTORY  Chief Complaint Migraine and Mental Health Problem    HPI Tammy Robles is a 46 y.o. female Patient presents today complaining of headache patient has chronic headaches at one point was document that she had headaches 20 days out of 30 days. She has multiple different kinds of headaches. This is a migraine headache, she states she has a brought on by tension. She's been under stress recently. She has no SI or HI. She states that she has had no fever no chills. No stiff neck. This is similar to some prior headaches but seems somewhat worse. Not the worst headache of life. Patient has not followed up with her neurologist for years. 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 Denies any visual changes, denies any other concerns with this headache was gradual in onset. However, she is very concerned about and states that it is "different in some ways". She cannot be specific about this. She would like a CT scan.  Patient also complains of feeling anxious but she has no SI no HI and she declines to talk to psychiatry. She contracts for safety.    Past Medical History:  Diagnosis Date  . Bladder prolapse, female, acquired   . Endometriosis 1996  . Endometritis 1990   fever 105 post delivery  . Idiopathic intracranial hypertension   . Polycystic ovarian disease 1993    Patient Active Problem List   Diagnosis Date Noted  . Tobacco use disorder 12/23/2016  . Bipolar 2 disorder (HCC) 12/20/2016  . Cannabis use disorder, moderate, dependence (HCC) 12/20/2016  . Migraine headache 12/20/2016  . Borderline personality disorder (HCC) 11/04/2014  . PTSD (post-traumatic stress disorder) 03/29/2004    Past Surgical History:  Procedure Laterality Date  . ABDOMINAL HYSTERECTOMY    . HIP  ARTHRODESIS W/ ILIAC CREST BONE GRAFT  1984   right gaint cell tumor  . HIP ARTHROPLASTY    . hysterrectomy  1996  . LAPAROTOMY     multiple for cysts  . LEFT OOPHORECTOMY  2007  . mass off appendix    . RIGHT OOPHORECTOMY    . TONSILLECTOMY    . WRIST SURGERY  2007 and 2003   both tendon release    Prior to Admission medications   Medication Sig Start Date End Date Taking? Authorizing Provider  fluvoxaMINE (LUVOX) 50 MG tablet Take 3 tablets (150 mg total) by mouth at bedtime. 12/25/16   Pucilowska, Braulio Conte B, MD  gabapentin (NEURONTIN) 400 MG capsule Take 1 capsule (400 mg total) by mouth 3 (three) times daily. 12/25/16   Pucilowska, Braulio Conte B, MD  HYDROcodone-acetaminophen (NORCO/VICODIN) 5-325 MG tablet Take 1-2 tablets by mouth every 4 (four) hours as needed for moderate pain. 01/26/17   Loleta Rose, MD  hydrOXYzine (ATARAX/VISTARIL) 25 MG tablet Take 1 tablet (25 mg total) by mouth 3 (three) times daily as needed for anxiety. 12/25/16   Pucilowska, Ellin Goodie, MD  ibuprofen (ADVIL,MOTRIN) 600 MG tablet Take 1 tablet by mouth three times daily with meals 01/26/17   Loleta Rose, MD  lamoTRIgine (LAMICTAL) 200 MG tablet Take 1 tablet (200 mg total) by mouth at bedtime. 12/25/16   Pucilowska, Jolanta B, MD  LORazepam (ATIVAN) 1 MG tablet Take 1 tablet (1 mg total) by mouth every 6 (six) hours as needed for anxiety. 01/27/17 01/27/18  Rockne Menghini, MD  ondansetron (ZOFRAN ODT) 4 MG disintegrating tablet Allow 1-2 tablets to dissolve in your mouth every 8 hours as needed for nausea/vomiting 01/26/17   Loleta RoseForbach, Cory, MD  oxyCODONE-acetaminophen (ROXICET) 5-325 MG tablet Take 1 tablet by mouth every 4 (four) hours as needed for severe pain. 12/29/16   Arnaldo NatalMalinda, Paul F, MD  prazosin (MINIPRESS) 2 MG capsule Take 1 capsule (2 mg total) by mouth daily with breakfast. 12/26/16   Pucilowska, Jolanta B, MD  prochlorperazine (COMPAZINE) 10 MG tablet Take 1 tablet (10 mg total) by mouth every 6 (six)  hours as needed for nausea or vomiting. 12/25/16   Pucilowska, Jolanta B, MD  QUEtiapine (SEROQUEL) 300 MG tablet Take 1 tablet (300 mg total) by mouth at bedtime. 12/25/16   Pucilowska, Ellin GoodieJolanta B, MD    Allergies Latex; Amoxicillin; Darvocet [propoxyphene n-acetaminophen]; Gabapentin; Reglan [metoclopramide]; and Sulfa antibiotics  Family History  Problem Relation Age of Onset  . Stroke Mother   . Cancer Father     Social History Social History  Substance Use Topics  . Smoking status: Current Some Day Smoker    Packs/day: 0.50  . Smokeless tobacco: Never Used  . Alcohol use No    Review of Systems Constitutional: No fever/chills Eyes: No visual changes. ENT: No sore throat. No stiff neck no neck pain Cardiovascular: Denies chest pain. Respiratory: Denies shortness of breath. Gastrointestinal:   no vomiting.  No diarrhea.  No constipation. Genitourinary: Negative for dysuria. Musculoskeletal: Negative lower extremity swelling Skin: Negative for rash. Neurological: Nwas of her chronic headaches, negative, focal weakness or numbness.   ____________________________________________   PHYSICAL EXAM:  VITAL SIGNS: ED Triage Vitals  Enc Vitals Group     BP 02/23/17 1921 108/74     Pulse Rate 02/23/17 1921 83     Resp 02/23/17 1921 17     Temp 02/23/17 1921 97.7 F (36.5 C)     Temp Source 02/23/17 1921 Oral     SpO2 02/23/17 1921 99 %     Weight 02/23/17 1921 271 lb (122.9 kg)     Height 02/23/17 1921 5' 7.5" (1.715 m)     Head Circumference --      Peak Flow --      Pain Score 02/23/17 1920 8     Pain Loc --      Pain Edu? --      Excl. in GC? --     Constitutional: Alert and oriented. Well appearing and in no acute distress.laughing and in no acute distress Eyes: Conjunctivae are normal Head: Atraumatic HEENT: No congestion/rhinnorhea. Mucous membranes are moist.  Oropharynx non-erythematous Neck:   Nontender with no meningismus, no masses, no  stridor Cardiovascular: Normal rate, regular rhythm. Grossly normal heart sounds.  Good peripheral circulation. Respiratory: Normal respiratory effort.  No retractions. Lungs CTAB. Abdominal: Soft and nontender. No distention. No guarding no rebound Back:  There is no focal tenderness or step off.  there is no midline tenderness there are no lesions noted. there is no CVA tenderness Musculoskeletal: No lower extremity tenderness, no upper extremity tenderness. No joint effusions, no DVT signs strong distal pulses no edema Neurologic:  Normal speech and language. No gross focal neurologic deficits are appreciated.  Skin:  Skin is warm, dry and intact. No rash noted. Psychiatric: Mood and affect are normal. Speech and behavior are normal.  ____________________________________________   LABS (all labs ordered are listed, but only abnormal results are displayed)  Labs Reviewed  COMPREHENSIVE METABOLIC PANEL - Abnormal; Notable  for the following:       Result Value   Creatinine, Ser 1.12 (*)    ALT 11 (*)    GFR calc non Af Amer 58 (*)    All other components within normal limits  ACETAMINOPHEN LEVEL - Abnormal; Notable for the following:    Acetaminophen (Tylenol), Serum <10 (*)    All other components within normal limits  URINE DRUG SCREEN, QUALITATIVE ([facility] ONLY) - Abnormal; Notable for the following:    Tricyclic, Ur Screen POSITIVE (*)    Cannabinoid 50 Ng, Ur [place] POSITIVE (*)    All other components within normal limits  ETHANOL  SALICYLATE LEVEL  CBC    Pertinent labs  results that were available during my care of the patient were reviewed by me and considered in my medical decision making (see chart for details). ____________________________________________  EKG  I personally interpreted any EKGs ordered by me or triage  ____________________________________________  RADIOLOGY  Pertinent labs & imaging results that were available during my care of the patient were  reviewed by me and considered in my medical decision making (see chart for details). If possible, patient and/or family made aware of any abnormal findings. ____________________________________________    PROCEDURES  Procedure(s) performed: None  Procedures  Critical Care performed: None  ____________________________________________   INITIAL IMPRESSION / ASSESSMENT AND PLAN / ED COURSE  Pertinent labs & imaging results that were available during my care of the patient were reviewed by me and considered in my medical decision making (see chart for details).  has patient states his headache was different in some ways on her chronic headaches, as well as history of pseudotumor we did a CT scan which is negative patient feels much better with like to be discharged. She does not have any SI or HI does not want to talk to anyone about her anxiety.  Pt remains at neurologic baseline. At this time, there is nothing to suggest or support the diagnosis of subarachnoid hemorrhage, aneurysmal event, meningitis, tumor or mass, cavernous thrombosis, encephalitis, ischemic stroke, pseudotumor cerebri, glaucoma, temporal arteritis, or any other acute intracrania/neurological process. Extensive return precautions including but not limited to any new or worrisome symptoms such as worsening of, or change in, headache, any neurological symptoms, fever etc.. Natural disease course discussed with patient. The need for follow-up and all of my customary return precautions have been discussed as well.  ----------------------------------------- 11:32 PM on 02/23/2017 -----------------------------------------  She will follow closely with neurology for her chronic headaches, she is neurologically intact at this time. She is resting herself on, she would like to go home. I instructed her not to drive after tramadol    ____________________________________________   FINAL CLINICAL IMPRESSION(S) / ED  DIAGNOSES  Final diagnoses:  None      This chart was dictated using voice recognition software.  Despite best efforts to proofread,  errors can occur which can change meaning.      Jeanmarie Plant, MD 02/23/17 2317    Jeanmarie Plant, MD 02/23/17 [phone]

## 2017-02-23 NOTE — ED Notes (Signed)
Patient changed into burgundy scrubs and belongings secured by Eileen StanfordJenna RN and Myra NT.    Patient's top, sweater, pants, underwear, bra, silver colored ring, hair tie and purse were all placed into belongings bags and labeled with patient chart labels.

## 2017-02-23 NOTE — ED Notes (Signed)
Patient given meal tray and ginger ale 

## 2017-03-14 ENCOUNTER — Emergency Department
Admission: EM | Admit: 2017-03-14 | Discharge: 2017-03-14 | Disposition: A | Discharge status: Home / Self Care | Payer: Self-pay | Attending: Emergency Medicine | Admitting: Emergency Medicine

## 2017-03-14 ENCOUNTER — Encounter: Payer: Self-pay | Admitting: Emergency Medicine

## 2017-03-14 ENCOUNTER — Emergency Department: Payer: Self-pay

## 2017-03-14 DIAGNOSIS — R109 Unspecified abdominal pain: Secondary | ICD-10-CM | POA: Insufficient documentation

## 2017-03-14 DIAGNOSIS — F17200 Nicotine dependence, unspecified, uncomplicated: Secondary | ICD-10-CM | POA: Insufficient documentation

## 2017-03-14 DIAGNOSIS — Z791 Long term (current) use of non-steroidal anti-inflammatories (NSAID): Secondary | ICD-10-CM | POA: Insufficient documentation

## 2017-03-14 DIAGNOSIS — F319 Bipolar disorder, unspecified: Secondary | ICD-10-CM | POA: Insufficient documentation

## 2017-03-14 DIAGNOSIS — Z79899 Other long term (current) drug therapy: Secondary | ICD-10-CM | POA: Insufficient documentation

## 2017-03-14 DIAGNOSIS — K839 Disease of biliary tract, unspecified: Secondary | ICD-10-CM | POA: Insufficient documentation

## 2017-03-14 DIAGNOSIS — Z96649 Presence of unspecified artificial hip joint: Secondary | ICD-10-CM | POA: Insufficient documentation

## 2017-03-14 DIAGNOSIS — Z91040 Latex allergy status: Secondary | ICD-10-CM | POA: Insufficient documentation

## 2017-03-14 DIAGNOSIS — F603 Borderline personality disorder: Secondary | ICD-10-CM | POA: Insufficient documentation

## 2017-03-14 DIAGNOSIS — F1220 Cannabis dependence, uncomplicated: Secondary | ICD-10-CM | POA: Insufficient documentation

## 2017-03-14 DIAGNOSIS — K838 Other specified diseases of biliary tract: Secondary | ICD-10-CM

## 2017-03-14 LAB — CBC
HEMATOCRIT: 39.1 % (ref 35.0–47.0)
Hemoglobin: 13.1 g/dL (ref 12.0–16.0)
MCH: 29.2 pg (ref 26.0–34.0)
MCHC: 33.4 g/dL (ref 32.0–36.0)
MCV: 87.3 fL (ref 80.0–100.0)
PLATELETS: 317 10*3/uL (ref 150–440)
RBC: 4.47 MIL/uL (ref 3.80–5.20)
RDW: 13.5 % (ref 11.5–14.5)
WBC: 5.3 10*3/uL (ref 3.6–11.0)

## 2017-03-14 LAB — URINALYSIS, ROUTINE W REFLEX MICROSCOPIC
Bilirubin Urine: NEGATIVE
GLUCOSE, UA: NEGATIVE mg/dL
KETONES UR: NEGATIVE mg/dL
Nitrite: NEGATIVE
PROTEIN: NEGATIVE mg/dL
Specific Gravity, Urine: 1.009 (ref 1.005–1.030)
pH: 7 (ref 5.0–8.0)

## 2017-03-14 LAB — COMPREHENSIVE METABOLIC PANEL
ALBUMIN: 4.1 g/dL (ref 3.5–5.0)
ALT: 12 U/L — AB (ref 14–54)
AST: 19 U/L (ref 15–41)
Alkaline Phosphatase: 90 U/L (ref 38–126)
Anion gap: 8 (ref 5–15)
BUN: 9 mg/dL (ref 6–20)
CHLORIDE: 105 mmol/L (ref 101–111)
CO2: 27 mmol/L (ref 22–32)
CREATININE: 0.9 mg/dL (ref 0.44–1.00)
Calcium: 9.8 mg/dL (ref 8.9–10.3)
GFR calc Af Amer: >60 mL/min (ref >60)
GLUCOSE: 89 mg/dL (ref 65–99)
POTASSIUM: 4 mmol/L (ref 3.5–5.1)
Sodium: 140 mmol/L (ref 135–145)
Total Bilirubin: 0.5 mg/dL (ref 0.3–1.2)
Total Protein: 7.8 g/dL (ref 6.5–8.1)

## 2017-03-14 LAB — TYPE AND SCREEN
ABO/RH(D): A POS
Antibody Screen: NEGATIVE

## 2017-03-14 MED ORDER — ONDANSETRON HCL 4 MG/2ML IJ SOLN
4.0000 mg | INTRAMUSCULAR | Status: AC
  Administered 2017-03-14: 4 mg via INTRAVENOUS
  Filled 2017-03-14: qty 2

## 2017-03-14 MED ORDER — MORPHINE SULFATE (PF) 4 MG/ML IV SOLN
4.0000 mg | Freq: Once | INTRAVENOUS | Status: AC
  Administered 2017-03-14: 4 mg via INTRAVENOUS
  Filled 2017-03-14: qty 1

## 2017-03-14 MED ORDER — IOPAMIDOL (ISOVUE-300) INJECTION 61%
100.0000 mL | Freq: Once | INTRAVENOUS | Status: AC | PRN
  Administered 2017-03-14: 100 mL via INTRAVENOUS

## 2017-03-14 MED ORDER — IOPAMIDOL (ISOVUE-300) INJECTION 61%
30.0000 mL | Freq: Once | INTRAVENOUS | Status: AC
  Administered 2017-03-14: 30 mL via ORAL

## 2017-03-14 NOTE — ED Provider Notes (Signed)
[facility] Emergency Department Provider Note  ____________________________________________   First MD Initiated Contact with Patient 03/14/17 1726     (approximate)  I have reviewed the triage vital signs and the nursing notes.   HISTORY  Chief Complaint Rectal Bleeding and Abdominal Pain    HPI Tammy Robles is a 46 y.o. female with medical and psychiatric history as listed below who presents for evaluation of what she describes as dark colored in her stool and right-sided abdominal pain.  Nothing makes it better and moving around makes it worse.  She reports that the blood in stool has been present for at least several days.  She has a history of ulcers which concerns her.  She was able to ambulate to triage with no apparent distress and she denies fever/chills, chest pain, shortness of breath, nausea, and vomiting.  She has never had bloody stools in the past and she is on no anticoagulation.  She describes her symptoms as severe including the pain which she describes as both sharp and aching.  She repeatedly mentions her history of ulcer and is very concerned about it.   Past Medical History:  Diagnosis Date  . Bladder prolapse, female, acquired   . Endometriosis 1996  . Endometritis 1990   fever 105 post delivery  . Idiopathic intracranial hypertension   . Polycystic ovarian disease 1993    Patient Active Problem List   Diagnosis Date Noted  . Tobacco use disorder 12/23/2016  . Bipolar 2 disorder (HCC) 12/20/2016  . Cannabis use disorder, moderate, dependence (HCC) 12/20/2016  . Migraine headache 12/20/2016  . Borderline personality disorder (HCC) 11/04/2014  . PTSD (post-traumatic stress disorder) 03/29/2004    Past Surgical History:  Procedure Laterality Date  . ABDOMINAL HYSTERECTOMY    . 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 HIP ARTHROPLASTY    . hysterrectomy  1996  . LAPAROTOMY     multiple  for cysts  . LEFT OOPHORECTOMY  2007  . mass off appendix    . RIGHT OOPHORECTOMY    . TONSILLECTOMY    . WRIST SURGERY  2007 and 2003   both tendon release    Prior to Admission medications   Medication Sig Start Date End Date Taking? Authorizing Provider  fluvoxaMINE (LUVOX) 50 MG tablet Take 3 tablets (150 mg total) by mouth at bedtime. 12/25/16   Pucilowska, Braulio ConteJolanta B, MD  gabapentin (NEURONTIN) 400 MG capsule Take 1 capsule (400 mg total) by mouth 3 (three) times daily. 12/25/16   Pucilowska, Braulio ConteJolanta B, MD  HYDROcodone-acetaminophen (NORCO/VICODIN) 5-325 MG tablet Take 1-2 tablets by mouth every 4 (four) hours as needed for moderate pain. 01/26/17   Loleta RoseForbach, Chelby Salata, MD  hydrOXYzine (ATARAX/VISTARIL) 25 MG tablet Take 1 tablet (25 mg total) by mouth 3 (three) times daily as needed for anxiety. 12/25/16   Pucilowska, Ellin GoodieJolanta B, MD  ibuprofen (ADVIL,MOTRIN) 600 MG tablet Take 1 tablet by mouth three times daily with meals 01/26/17   Loleta RoseForbach, Alexah Kivett, MD  lamoTRIgine (LAMICTAL) 200 MG tablet Take 1 tablet (200 mg total) by mouth at bedtime. 12/25/16   Pucilowska, Jolanta B, MD  LORazepam (ATIVAN) 1 MG tablet Take 1 tablet (1 mg total) by mouth every 6 (six) hours as needed for anxiety. 01/27/17 01/27/18  Rockne MenghiniNorman, Anne-Caroline, MD  ondansetron (ZOFRAN ODT) 4 MG disintegrating tablet Allow 1-2 tablets to dissolve in your mouth every 8 hours as needed for nausea/vomiting 01/26/17  Loleta RoseForbach, Jenisa Monty, MD  oxyCODONE-acetaminophen (ROXICET) 5-325 MG tablet Take 1 tablet by mouth every 4 (four) hours as needed for severe pain. 12/29/16   Arnaldo NatalMalinda, Paul F, MD  prazosin (MINIPRESS) 2 MG capsule Take 1 capsule (2 mg total) by mouth daily with breakfast. 12/26/16   Pucilowska, Jolanta B, MD  prochlorperazine (COMPAZINE) 10 MG tablet Take 1 tablet (10 mg total) by mouth every 6 (six) hours as needed for nausea or vomiting. 12/25/16   Pucilowska, Jolanta B, MD  QUEtiapine (SEROQUEL) 300 MG tablet Take 1 tablet (300 mg total) by  mouth at bedtime. 12/25/16   Pucilowska, Ellin GoodieJolanta B, MD    Allergies Latex; Amoxicillin; Darvocet [propoxyphene n-acetaminophen]; Gabapentin; Reglan [metoclopramide]; and Sulfa antibiotics  Family History  Problem Relation Age of Onset  . Stroke Mother   . Cancer Father     Social History Social History   Tobacco Use  . Smoking status: Current Some Day Smoker    Packs/day: 0.50  . Smokeless tobacco: Never Used  Substance Use Topics  . Alcohol use: No  . Drug use: No    Review of Systems Constitutional: No fever/chills Eyes: No visual changes. ENT: No sore throat. Cardiovascular: Denies chest pain. Respiratory: Denies shortness of breath. Gastrointestinal: No pain primarily on the right side.  Dark blood in stools for several days. Genitourinary: Negative for dysuria. Musculoskeletal: Negative for neck pain.  Negative for back pain. Integumentary: Negative for rash. Neurological: Negative for headaches, focal weakness or numbness.   ____________________________________________   PHYSICAL EXAM:  VITAL SIGNS: ED Triage Vitals  Enc Vitals Group     BP 03/14/17 1401 (!) 142/51     Pulse Rate 03/14/17 1401 72     Resp 03/14/17 1401 17     Temp 03/14/17 1401 98.3 F (36.8 C)     Temp Source 03/14/17 1401 Oral     SpO2 03/14/17 1401 98 %     Weight 03/14/17 1402 127 kg (280 lb)     Height 03/14/17 1402 1.715 m (5' 7.5")     Head Circumference --      Peak Flow --      Pain Score 03/14/17 1401 8     Pain Loc --      Pain Edu? --      Excl. in GC? --     Constitutional: Alert and oriented. Well appearing and in no acute distress. Eyes: Conjunctivae are normal.  Head: Atraumatic. Nose: No congestion/rhinnorhea. Mouth/Throat: Mucous membranes are moist. Neck: No stridor.  No meningeal signs.   Cardiovascular: Normal rate, regular rhythm. Good peripheral circulation. Grossly normal heart sounds. Respiratory: Normal respiratory effort.  No retractions. Lungs  CTAB. Gastrointestinal: Soft with generalized tenderness throughout, no peritonitis, no rebound/guarding Rectal: Normal external rectal exam, normal colored stool is present in the rectum, Hemoccult negative with quality control passed.  ED chaperone present throughout exam. Musculoskeletal: No lower extremity tenderness nor edema. No gross deformities of extremities. Neurologic:  Normal speech and language. No gross focal neurologic deficits are appreciated.  Skin:  Skin is warm, dry and intact. No rash noted. Psychiatric: Mood and affect are normal. Speech and behavior are normal.  ____________________________________________   LABS (all labs ordered are listed, but only abnormal results are displayed)  Labs Reviewed  COMPREHENSIVE METABOLIC PANEL - Abnormal; Notable for the following components:      Result Value   ALT 12 (*)    All other components within normal limits  URINALYSIS, ROUTINE W REFLEX MICROSCOPIC -  Abnormal; Notable for the following components:   Color, Urine STRAW (*)    APPearance CLEAR (*)    Hgb urine dipstick SMALL (*)    Leukocytes, UA TRACE (*)    Bacteria, UA MANY (*)    Squamous Epithelial / LPF 0-5 (*)    All other components within normal limits  URINE CULTURE  CBC  POC OCCULT BLOOD, ED  TYPE AND SCREEN   ____________________________________________  EKG  None - EKG not ordered by ED physician ____________________________________________  RADIOLOGY   Ct Abdomen Pelvis W Contrast  Result Date: 03/14/2017 CLINICAL DATA:  Right-sided abdominal pain EXAM: CT ABDOMEN AND PELVIS WITH CONTRAST TECHNIQUE: Multidetector CT imaging of the abdomen and pelvis was performed using the standard protocol following bolus administration of intravenous contrast. CONTRAST:  ISOVUE-300 IOPAMIDOL (ISOVUE-300) INJECTION 61% COMPARISON:  12/29/2016, 09/30/2014 FINDINGS: Lower chest: No acute abnormality. Hepatobiliary: High density in the gallbladder likely  reflects sludge. No calcified stones or wall thickening. No biliary dilatation. No focal hepatic abnormality Pancreas: Unremarkable. No pancreatic ductal dilatation or surrounding inflammatory changes. Spleen: Normal in size without focal abnormality. Adrenals/Urinary Tract: Adrenal glands are unremarkable. Kidneys are normal, without renal calculi, focal lesion, or hydronephrosis. Small cystocele. Stomach/Bowel: Stomach is within normal limits. Appendix appears normal. No evidence of bowel wall thickening, distention, or inflammatory changes. Few sigmoid colon diverticula without acute wall thickening Vascular/Lymphatic: No significant vascular findings are present. No enlarged abdominal or pelvic lymph nodes. Reproductive: Status post hysterectomy. No adnexal masses. Other: Negative for free air or free fluid.  Fat in the umbilicus Musculoskeletal: Degenerative changes of the spine. Advanced arthritis of the right hip with evidence of prior surgical hardware in the proximal right femur with old appearing femoral neck fracture. IMPRESSION: 1. Negative appendix. No CT evidence for acute intra-abdominal or pelvic abnormality 2. High density in the lumen of the gallbladder likely reflects sludge 3. Low lying inferior aspect of bladder consistent with cystocele. Electronically Signed   By: Jasmine Pang M.D.   On: 03/14/2017 18:37   US Abdomen Limited Ruq  Result Date: 03/14/2017 CLINICAL DATA:  Right-sided abdominal pain. Gallbladder sludge seen on CT. EXAM: ULTRASOUND ABDOMEN LIMITED RIGHT UPPER QUADRANT COMPARISON:  Abdominal CT 03/14/2017 FINDINGS: Gallbladder: Small amount of gallbladder sludge. No gallstones or wall thickening visualized. No sonographic Murphy sign noted by sonographer. Common bile duct: Diameter: 3 mm Liver: No focal lesion identified. Within normal limits in parenchymal echogenicity. Portal vein is patent on color Doppler imaging with normal direction of blood flow towards the liver.  IMPRESSION: Minimal gallbladder sludge, without evidence of acute cholecystitis. Electronically Signed   By: Ted Mcalpine M.D.   On: 03/14/2017 19:55    ____________________________________________   PROCEDURES  Critical Care performed: No   Procedure(s) performed:   Procedures   ____________________________________________   INITIAL IMPRESSION / ASSESSMENT AND PLAN / ED COURSE  As part of my medical decision making, I reviewed the following data within the electronic MEDICAL RECORD NUMBER Nursing notes reviewed and incorporated and Labs reviewed     Differential diagnosis includes, but is not limited to, ovarian cyst, ovarian torsion, acute appendicitis, diverticulitis, urinary tract infection/pyelonephritis, endometriosis, bowel obstruction, colitis, renal colic, gastroenteritis, hernia, fibroids, endometriosis, pregnancy related pain including ectopic pregnancy, etc. Rectal bleeding could be the result of diverticular bleeding, hemorrhoids, neoplasm, etc., however the patient has no evidence of rectal bleeding at this time with Hemoccult negative stool.  I will evaluate with a CT scan of her abdomen pelvis.  CMP is unremarkable and CBC is normal.  No dysuria, but with some leukocytes, added on urine culture (patient should be contacted if antibiotics are required). Morphine and Zofran for pain, will reassess after imaging.   Clinical Course as of Mar 15 12  Tue Mar 14, 2017  1901 Biliary sludge, otherwise the exam is unremarkable.  I will order a right upper quadrant ultrasound to further evaluate the possibility of gallbladder disease. CT Abdomen Pelvis W Contrast [CF]  2100 U/S unremarkable except for some sludge.  Patient's pain has resolved and she is comfortable with the plan to follow up.  No evidence of acute/emergent medical problems.  Gave usual/customary return precautions.  Patient agrees with the plan.  [CF]    Clinical Course User Index [CF] Loleta Rose, MD     ____________________________________________  FINAL CLINICAL IMPRESSION(S) / ED DIAGNOSES  Final diagnoses:  Right sided abdominal pain  Biliary sludge     MEDICATIONS GIVEN DURING THIS VISIT:  Medications  morphine 4 MG/ML injection 4 mg (4 mg Intravenous Given 03/14/17 1758)  ondansetron (ZOFRAN) injection 4 mg (4 mg Intravenous Given 03/14/17 1758)  iopamidol (ISOVUE-300) 61 % injection 30 mL (30 mLs Oral Contrast Given 03/14/17 1740)  iopamidol (ISOVUE-300) 61 % injection 100 mL (100 mLs Intravenous Contrast Given 03/14/17 1815)     ED Discharge Orders    None       Note:  This document was prepared using Dragon voice recognition software and may include unintentional dictation errors.    Loleta Rose, MD 03/15/17 [phone]

## 2017-03-14 NOTE — Discharge Instructions (Signed)
You have been seen in the Emergency Department (ED) for abdominal pain.  Your evaluation did not identify a clear cause of your symptoms but was generally reassuring.  It may be the result of some "sludge" in your gallbladder.  Please consider following up with a general surgeon to discuss whether or not you would benefit from having your gallbladder removed.  Please follow up as instructed above regarding today?s emergent visit and the symptoms that are bothering you.  Return to the ED if your abdominal pain worsens or fails to improve, you develop bloody vomiting, bloody diarrhea, you are unable to tolerate fluids due to vomiting, fever greater than 101, or other symptoms that concern you.

## 2017-03-14 NOTE — ED Triage Notes (Signed)
Pt comes into the ED via POV c/o right sided abdominal pain, N/V, and rectal bleeding.  States the blood is dark in color and denies any iron supplements.  Per Patient she has a h.o ulcers.  Patient ambulatory to triage at this time and denies any chest pain, shortness of breath, or dizziness.

## 2017-03-14 NOTE — ED Notes (Addendum)
Dark red bleeding from rectum, right sided abdominal pain, right flank pain. No new urinary sx. Pt alert and oriented X4, active, cooperative, pt in NAD. RR even and unlabored, color WNL.

## 2017-03-14 NOTE — ED Notes (Signed)
ED Provider at bedside. 

## 2017-03-17 LAB — URINE CULTURE: Special Requests: NORMAL

## 2017-03-20 ENCOUNTER — Ambulatory Visit: Payer: Self-pay | Admitting: Surgery

## 2017-03-21 ENCOUNTER — Telehealth: Payer: Self-pay | Admitting: General Practice

## 2017-03-21 NOTE — Telephone Encounter (Signed)
Left a message for the patient to call the office to reschedule her no showed appointment on 03/20/17 for a New Patient-ED referral-Small amount of gallbladder sludge-abdominal pain-patient called to request appt.if patient calls back please reschedule appointment.

## 2017-03-22 ENCOUNTER — Telehealth: Payer: Self-pay | Admitting: Pharmacy Technician

## 2017-03-22 NOTE — Telephone Encounter (Signed)
[facility] filled initial prescription.  Patient was given new patient packet.  Patient never returned information or scheduled and eligibility appointment.  [facility] unable to provide additional medication assistance until eligibility is determined.  Betty J. Kluttz Care Manager [facility] 

## 2017-04-11 NOTE — Telephone Encounter (Signed)
Spoke with patient said she did not want to schedule this appointment at this time , but may call back at later time if she starts having trouble again.

## 2017-06-11 ENCOUNTER — Other Ambulatory Visit: Payer: Self-pay

## 2017-06-11 ENCOUNTER — Emergency Department: Payer: Self-pay

## 2017-06-11 ENCOUNTER — Emergency Department
Admission: EM | Admit: 2017-06-11 | Discharge: 2017-06-11 | Disposition: A | Discharge status: Home / Self Care | Payer: Self-pay | Attending: Emergency Medicine | Admitting: Emergency Medicine

## 2017-06-11 ENCOUNTER — Encounter: Payer: Self-pay | Admitting: Emergency Medicine

## 2017-06-11 DIAGNOSIS — Y939 Activity, unspecified: Secondary | ICD-10-CM | POA: Insufficient documentation

## 2017-06-11 DIAGNOSIS — F17210 Nicotine dependence, cigarettes, uncomplicated: Secondary | ICD-10-CM | POA: Insufficient documentation

## 2017-06-11 DIAGNOSIS — Y999 Unspecified external cause status: Secondary | ICD-10-CM | POA: Insufficient documentation

## 2017-06-11 DIAGNOSIS — S79911A Unspecified injury of right hip, initial encounter: Secondary | ICD-10-CM | POA: Insufficient documentation

## 2017-06-11 DIAGNOSIS — Z86018 Personal history of other benign neoplasm: Secondary | ICD-10-CM | POA: Insufficient documentation

## 2017-06-11 DIAGNOSIS — Z79899 Other long term (current) drug therapy: Secondary | ICD-10-CM | POA: Insufficient documentation

## 2017-06-11 DIAGNOSIS — Z96641 Presence of right artificial hip joint: Secondary | ICD-10-CM | POA: Insufficient documentation

## 2017-06-11 DIAGNOSIS — W19XXXA Unspecified fall, initial encounter: Secondary | ICD-10-CM

## 2017-06-11 DIAGNOSIS — M25551 Pain in right hip: Secondary | ICD-10-CM

## 2017-06-11 DIAGNOSIS — Y929 Unspecified place or not applicable: Secondary | ICD-10-CM | POA: Insufficient documentation

## 2017-06-11 DIAGNOSIS — Z91040 Latex allergy status: Secondary | ICD-10-CM | POA: Insufficient documentation

## 2017-06-11 MED ORDER — MELOXICAM 15 MG PO TABS: 15.0000 mg | ORAL_TABLET | Freq: Every day | ORAL | 0 refills | Status: AC

## 2017-06-11 NOTE — ED Notes (Signed)
See triage note   States she fell last pm  Landed on right hip  No deformity. Is able to ambulate with limp d/t pain

## 2017-06-11 NOTE — ED Triage Notes (Signed)
Pt c/o fall while walking last night. C/o R hip pain at this time. Pt states was ambulatory at this time with impaired gait at baseline.

## 2017-06-11 NOTE — ED Provider Notes (Signed)
[facility] Emergency Department Provider Note  ____________________________________________  Time seen: Approximately 9:14 AM  I have reviewed the triage vital signs and the nursing notes.   HISTORY  Chief Complaint Fall    HPI Tammy Robles is a 47 y.o. female that presents to the emergency department for evaluation of chronic right hip pain that worsened after falling yesterday.  Patient states that she has very limited mobility in that hip, which caused her to fall last night.  She states that her hip has felt "aggravated" since fall and "now it feels like the muscles are involved."  She has been walking but with pain.  She has always had a limp.  She has had surgery on that hip twice.  Last appointment with orthopedist was 1 year ago. No numbness, tingling.    Past Medical History:  Diagnosis Date  . Bladder prolapse, female, acquired   . Endometriosis 1996  . Endometritis 1990   fever 105 post delivery  . Idiopathic intracranial hypertension   . Polycystic ovarian disease 1993    Patient Active Problem List   Diagnosis Date Noted  . Overweight 01/02/2017  . Tobacco use disorder 12/23/2016  . Bipolar 2 disorder (HCC) 12/20/2016  . Cannabis use disorder, moderate, dependence (HCC) 12/20/2016  . Migraine headache 12/20/2016  . Borderline personality disorder (HCC) 11/04/2014  . Anxiety and depression 03/23/2012  . PTSD (post-traumatic stress disorder) 03/29/2004    Past Surgical History:  Procedure Laterality Date  . ABDOMINAL HYSTERECTOMY    . 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 HIP ARTHROPLASTY    . hysterrectomy  1996  . LAPAROTOMY     multiple for cysts  . LEFT OOPHORECTOMY  2007  . mass off appendix    . RIGHT OOPHORECTOMY    . TONSILLECTOMY    . WRIST SURGERY  2007 and 2003   both tendon release    Prior to Admission medications   Medication Sig Start Date End Date Taking? Authorizing  Provider  DULoxetine (CYMBALTA) 30 MG capsule Take 90 mg by mouth daily.   Yes [provider]  buPROPion (WELLBUTRIN XL) 150 MG 24 hr tablet Take 300 mg by mouth. 12/01/16   [provider]  clonazePAM (KLONOPIN) 0.5 MG tablet Take 0.5 mg by mouth. 12/01/16   [provider]  cyclobenzaprine (FLEXERIL) 5 MG tablet Take 5 mg by mouth. 06/19/16   [provider]  diclofenac sodium (VOLTAREN) 1 % GEL Apply topically. 04/21/15   [provider]  estradiol (ESTRACE) 0.1 MG/GM vaginal cream Place vaginally. 12/07/15   [provider]  FLUoxetine (PROZAC) 40 MG capsule Take 80 mg by mouth. 12/01/16   [provider]  fluvoxaMINE (LUVOX) 50 MG tablet Take 3 tablets (150 mg total) by mouth at bedtime. 12/25/16   Pucilowska, Braulio Conte B, MD  gabapentin (NEURONTIN) 400 MG capsule Take 1 capsule (400 mg total) by mouth 3 (three) times daily. 12/25/16   Pucilowska, Braulio Conte B, MD  HYDROcodone-acetaminophen (NORCO/VICODIN) 5-325 MG tablet Take 1-2 tablets by mouth every 4 (four) hours as needed for moderate pain. 01/26/17   Loleta Rose, MD  hydrOXYzine (ATARAX/VISTARIL) 25 MG tablet Take 1 tablet (25 mg total) by mouth 3 (three) times daily as needed for anxiety. 12/25/16   Pucilowska, Ellin Goodie, MD  ibuprofen (ADVIL,MOTRIN) 600 MG tablet Take 1 tablet by mouth three times daily with meals 01/26/17   Loleta Rose, MD  lamoTRIgine (LAMICTAL) 200 MG tablet Take 1 tablet (200 mg total) by mouth at bedtime. 12/25/16   Pucilowska, Jolanta B, MD  LORazepam (ATIVAN) 1 MG tablet Take 1 tablet (1 mg total) by mouth every 6 (six) hours as needed for anxiety. 01/27/17 01/27/18  Rockne Menghini, MD  meloxicam (MOBIC) 15 MG tablet Take 1 tablet (15 mg total) by mouth daily for 10 days. 06/11/17 06/21/17  Enid Derry, PA-C  ondansetron (ZOFRAN ODT) 4 MG disintegrating tablet Allow 1-2 tablets to dissolve in your mouth every 8 hours as needed for nausea/vomiting 01/26/17    Loleta Rose, MD  oxyCODONE-acetaminophen (ROXICET) 5-325 MG tablet Take 1 tablet by mouth every 4 (four) hours as needed for severe pain. 12/29/16   Arnaldo Natal, MD  phenol (CHLORASEPTIC) 1.4 % LIQD 2 sprays by Mucous Membrane route every two (2) hours as needed. 02/24/16   [provider]  prazosin (MINIPRESS) 2 MG capsule Take 1 capsule (2 mg total) by mouth daily with breakfast. 12/26/16   Pucilowska, Jolanta B, MD  prochlorperazine (COMPAZINE) 10 MG tablet Take 1 tablet (10 mg total) by mouth every 6 (six) hours as needed for nausea or vomiting. 12/25/16   Pucilowska, Jolanta B, MD  QUEtiapine (SEROQUEL) 300 MG tablet Take 1 tablet (300 mg total) by mouth at bedtime. 12/25/16   Pucilowska, Ellin Goodie, MD    Allergies Gabapentin; Latex; Propoxyphene; Amoxicillin; Darvocet [propoxyphene n-acetaminophen]; Metoclopramide; Sulfa antibiotics; and Sulfacetamide sodium  Family History  Problem Relation Age of Onset  . Stroke Mother   . Cancer Father     Social History Social History   Tobacco Use  . Smoking status: Current Some Day Smoker    Packs/day: 0.50  . Smokeless tobacco: Never Used  Substance Use Topics  . Alcohol use: No  . Drug use: No     Review of Systems  Cardiovascular: No chest pain. Respiratory: No SOB. Gastrointestinal: No abdominal pain.  No nausea, no vomiting.  Musculoskeletal: Positive for hip pain. Skin: Negative for rash, abrasions, lacerations, ecchymosis. Neurological: Negative for numbness or tingling   ____________________________________________   PHYSICAL EXAM:  VITAL SIGNS: ED Triage Vitals  Enc Vitals Group     BP 06/11/17 0705 117/74     Pulse Rate 06/11/17 0705 81     Resp 06/11/17 0705 18     Temp 06/11/17 0705 98.2 F (36.8 C)     Temp Source 06/11/17 0705 Oral     SpO2 06/11/17 0705 98 %     Weight 06/11/17 0706 285 lb (129.3 kg)     Height 06/11/17 0706 5' 7.5" (1.715 m)     Head Circumference --      Peak Flow --       Pain Score 06/11/17 0705 8     Pain Loc --      Pain Edu? --      Excl. in GC? --      Constitutional: Alert and oriented. Well appearing and in no acute distress. Eyes: Conjunctivae are normal. PERRL. EOMI. Head: Atraumatic. ENT:      Ears:      Nose: No congestion/rhinnorhea.      Mouth/Throat: Mucous membranes are moist.  Neck: No stridor.  Cardiovascular: Normal rate, regular rhythm.  Good peripheral circulation. Respiratory: Normal respiratory effort without tachypnea or retractions. Lungs CTAB. Good air entry to the bases with no decreased or absent breath sounds. Musculoskeletal: Full range of motion to all extremities. No gross deformities appreciated.  Tenderness to  palpation throughout right hip.  Pain with range of motion.  Pain with internal and external rotation, which patient comments is normal for her. Neurologic:  Normal speech and language. No gross focal neurologic deficits are appreciated.  Skin:  Skin is warm, dry and intact. No rash noted.   ____________________________________________   LABS (all labs ordered are listed, but only abnormal results are displayed)  Labs Reviewed - No data to display ____________________________________________  EKG   ____________________________________________  RADIOLOGY Lexine BatonI, Kaelan Amble, personally viewed and evaluated these images (plain radiographs) as part of my medical decision making, as well as reviewing the written report by the radiologist.  Dg Hip Unilat  With Pelvis 2-3 Views Right  Result Date: 06/11/2017 CLINICAL DATA:  Fall, hip pain EXAM: DG HIP (WITH OR WITHOUT PELVIS) 2-3V RIGHT COMPARISON:  07/12/2015 FINDINGS: Chronic degenerative/posttraumatic changes in the right hip with suspected prior fixation of the right femoral neck, status post hardware removal. The superimposed degenerative changes have mildly progressed from 2017. However, there has been no acute fracture. Left hip joint space is preserved.  Visualized bony pelvis appears intact. Mild degenerative changes of the lower lumbar spine. IMPRESSION: Posttraumatic deformity of the right hip with removal of prior surgical hardware. Superimposed moderate degenerative changes, mildly progressed from 2017. No evidence of acute fracture. Electronically Signed   By: Charline BillsSriyesh  Krishnan M.D.   On: 06/11/2017 07:50    ____________________________________________    PROCEDURES  Procedure(s) performed:    Procedures    Medications - No data to display   ____________________________________________   INITIAL IMPRESSION / ASSESSMENT AND PLAN / ED COURSE  Pertinent labs & imaging results that were available during my care of the patient were reviewed by me and considered in my medical decision making (see chart for details).  Review of the  [place] was performed in accordance of the [facility] prior to dispensing any controlled drugs.   Patient presented to the emergency department for evaluation of hip pain after fall.  Vital signs and exam are reassuring.  X-ray negative for acute abnormalities and consistent with chronic changes.  Findings were discussed with patient.  Patient has a limp at baseline and pain with all range of motion of hip at baseline. Patient was given a walker.  Patient will be discharged home with prescriptions for Mobic. Patient is to follow up with orthopedics as directed. Patient is given ED precautions to return to the ED for any worsening or new symptoms.     ____________________________________________  FINAL CLINICAL IMPRESSION(S) / ED DIAGNOSES  Final diagnoses:  Fall, initial encounter  Pain of right hip joint      NEW MEDICATIONS STARTED DURING THIS VISIT:  ED Discharge Orders        Ordered    meloxicam (MOBIC) 15 MG tablet  Daily     06/11/17 0939          This chart was dictated using voice recognition software/Dragon. Despite best efforts to proofread, errors can occur which can change  the meaning. Any change was purely unintentional.    Enid DerryWagner, Franciscojavier Wronski, PA-C 06/11/17 1234    Minna AntisPaduchowski, Kevin, MD 06/11/17 1425

## 2017-06-20 ENCOUNTER — Ambulatory Visit: Payer: Self-pay

## 2017-06-26 ENCOUNTER — Ambulatory Visit: Payer: Self-pay

## 2017-07-06 ENCOUNTER — Telehealth: Payer: Self-pay | Admitting: Pharmacy Technician

## 2017-07-06 NOTE — Telephone Encounter (Signed)
[facility]  The first on 06/20/17 & the second on 2/18.  Patient called to cancel both appointments.  Did not want to reschedule.  [facility] unable to provide [facility] until eligibility is determined.  Sherilyn DacostaBetty J. Latravia Southgate Care Manager [facility]

## 2017-08-01 ENCOUNTER — Ambulatory Visit: Payer: Self-pay | Admitting: Pharmacy Technician

## 2017-08-01 DIAGNOSIS — Z79899 Other long term (current) drug therapy: Secondary | ICD-10-CM

## 2017-08-01 NOTE — Progress Notes (Signed)
  Completed [facility] application and contract.  Patient agreed to all terms of the [facility] contract.    Patient approved to receive medication assistance at [facility] through 2019, as long as eligibility criteria continues to be met.    Provided patient with community resource material based on her particular needs.    Sonny Poth J. Kynzley Dowson Care Manager [facility]  

## 2017-10-21 ENCOUNTER — Emergency Department
Admission: EM | Admit: 2017-10-21 | Discharge: 2017-10-21 | Disposition: A | Discharge status: Home / Self Care | Payer: Self-pay | Attending: Emergency Medicine | Admitting: Emergency Medicine

## 2017-10-21 ENCOUNTER — Encounter: Payer: Self-pay | Admitting: Emergency Medicine

## 2017-10-21 ENCOUNTER — Other Ambulatory Visit: Payer: Self-pay

## 2017-10-21 DIAGNOSIS — R45851 Suicidal ideations: Secondary | ICD-10-CM | POA: Insufficient documentation

## 2017-10-21 DIAGNOSIS — F329 Major depressive disorder, single episode, unspecified: Secondary | ICD-10-CM | POA: Insufficient documentation

## 2017-10-21 DIAGNOSIS — F32A Depression, unspecified: Secondary | ICD-10-CM

## 2017-10-21 DIAGNOSIS — M25559 Pain in unspecified hip: Secondary | ICD-10-CM | POA: Insufficient documentation

## 2017-10-21 DIAGNOSIS — G8929 Other chronic pain: Secondary | ICD-10-CM | POA: Insufficient documentation

## 2017-10-21 LAB — COMPREHENSIVE METABOLIC PANEL
ALT: 16 U/L (ref 14–54)
ANION GAP: 12 (ref 5–15)
AST: 21 U/L (ref 15–41)
Albumin: 4.3 g/dL (ref 3.5–5.0)
Alkaline Phosphatase: 87 U/L (ref 38–126)
BUN: 10 mg/dL (ref 6–20)
CHLORIDE: 103 mmol/L (ref 101–111)
CO2: 23 mmol/L (ref 22–32)
Calcium: 9.4 mg/dL (ref 8.9–10.3)
Creatinine, Ser: 0.75 mg/dL (ref 0.44–1.00)
Glucose, Bld: 91 mg/dL (ref 65–99)
POTASSIUM: 3.6 mmol/L (ref 3.5–5.1)
Sodium: 138 mmol/L (ref 135–145)
Total Bilirubin: 0.5 mg/dL (ref 0.3–1.2)
Total Protein: 7.8 g/dL (ref 6.5–8.1)

## 2017-10-21 LAB — CBC
HCT: 39 % (ref 35.0–47.0)
Hemoglobin: 13.2 g/dL (ref 12.0–16.0)
MCH: 30 pg (ref 26.0–34.0)
MCHC: 33.9 g/dL (ref 32.0–36.0)
MCV: 88.3 fL (ref 80.0–100.0)
PLATELETS: 279 10*3/uL (ref 150–440)
RBC: 4.42 MIL/uL (ref 3.80–5.20)
RDW: 13.5 % (ref 11.5–14.5)
WBC: 6.8 10*3/uL (ref 3.6–11.0)

## 2017-10-21 LAB — URINE DRUG SCREEN, QUALITATIVE (ARMC ONLY)
AMPHETAMINES, UR SCREEN: NOT DETECTED
BENZODIAZEPINE, UR SCRN: NOT DETECTED
CANNABINOID 50 NG, UR ~~LOC~~: POSITIVE — AB
Cocaine Metabolite,Ur ~~LOC~~: NOT DETECTED
MDMA (ECSTASY) UR SCREEN: NOT DETECTED
Methadone Scn, Ur: NOT DETECTED
Opiate, Ur Screen: NOT DETECTED
PHENCYCLIDINE (PCP) UR S: NOT DETECTED
TRICYCLIC, UR SCREEN: NOT DETECTED

## 2017-10-21 LAB — ACETAMINOPHEN LEVEL: Acetaminophen (Tylenol), Serum: <10 ug/mL — ABNORMAL LOW (ref 10–30)

## 2017-10-21 LAB — SALICYLATE LEVEL

## 2017-10-21 LAB — ETHANOL

## 2017-10-21 MED ORDER — ACETAMINOPHEN 500 MG PO TABS
1000.0000 mg | ORAL_TABLET | Freq: Once | ORAL | Status: AC
  Administered 2017-10-21: 1000 mg via ORAL
  Filled 2017-10-21: qty 2

## 2017-10-21 MED ORDER — ACETAMINOPHEN 325 MG PO TABS: 650.0000 mg | ORAL_TABLET | Freq: Once | ORAL | Status: DC

## 2017-10-21 MED ORDER — GABAPENTIN 300 MG PO CAPS: 300.0000 mg | ORAL_CAPSULE | Freq: Three times a day (TID) | ORAL | 0 refills | Status: DC

## 2017-10-21 MED ORDER — DULOXETINE HCL 30 MG PO CPEP: 60.0000 mg | ORAL_CAPSULE | Freq: Every day | ORAL | 0 refills | Status: DC

## 2017-10-21 MED ORDER — QUETIAPINE FUMARATE 100 MG PO TABS: 100.0000 mg | ORAL_TABLET | Freq: Two times a day (BID) | ORAL | 0 refills | Status: DC

## 2017-10-21 NOTE — ED Provider Notes (Signed)
[facility] Emergency Department Provider Note  ____________________________________________  Time seen: Approximately 1:44 PM  I have reviewed the triage vital signs and the nursing notes.   HISTORY  Chief Complaint Suicidal    HPI Tammy Robles is a 47 y.o. female who complains of worsening of her chronic depression symptoms over the past month, gradual, worsening, constant, no alleviating or aggravating factors, waxing and waning.  Feels like is been worse because a close friend of hers died a month ago.  She has suicidal thoughts but no intention of harming or killing herself.  She does not want to die.  No HI or hallucinations.  She is dissatisfied that the doctor at [facility] changed a lot of her psychiatry medicines after she was hospitalized here with behavioral health in the past.  She feels that she did better on the medicines that the hospital had started her on.      Past Medical History:  Diagnosis Date  . Bladder prolapse, female, acquired   . Endometriosis 1996  . Endometritis 1990   fever 105 post delivery  . Idiopathic intracranial hypertension   . Polycystic ovarian disease 1993     Patient Active Problem List   Diagnosis Date Noted  . Overweight 01/02/2017  . Tobacco use disorder 12/23/2016  . Bipolar 2 disorder (HCC) 12/20/2016  . Cannabis use disorder, moderate, dependence (HCC) 12/20/2016  . Migraine headache 12/20/2016  . Borderline personality disorder (HCC) 11/04/2014  . Anxiety and depression 03/23/2012  . PTSD (post-traumatic stress disorder) 03/29/2004     Past Surgical History:  Procedure Laterality Date  . ABDOMINAL HYSTERECTOMY    . 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 HIP ARTHROPLASTY    . hysterrectomy  1996  . LAPAROTOMY     multiple for cysts  . LEFT OOPHORECTOMY  2007  . mass off appendix    . RIGHT OOPHORECTOMY    . TONSILLECTOMY    . WRIST SURGERY  2007 and 2003    both tendon release     Prior to Admission medications   Medication Sig Start Date End Date Taking? Authorizing Provider  buPROPion (WELLBUTRIN XL) 150 MG 24 hr tablet Take 300 mg by mouth. 12/01/16   [provider]  clonazePAM (KLONOPIN) 0.5 MG tablet Take 0.5 mg by mouth. 12/01/16   [provider]  cyclobenzaprine (FLEXERIL) 5 MG tablet Take 5 mg by mouth. 06/19/16   [provider]  diclofenac sodium (VOLTAREN) 1 % GEL Apply topically. 04/21/15   [provider]  DULoxetine (CYMBALTA) 30 MG capsule Take 2 capsules (60 mg total) by mouth daily. 10/21/17   Sharman Cheek, MD  estradiol (ESTRACE) 0.1 MG/GM vaginal cream Place vaginally. 12/07/15   [provider]  FLUoxetine (PROZAC) 40 MG capsule Take 80 mg by mouth. 12/01/16   [provider]  fluvoxaMINE (LUVOX) 50 MG tablet Take 3 tablets (150 mg total) by mouth at bedtime. 12/25/16   Pucilowska, Braulio Conte B, MD  gabapentin (NEURONTIN) 300 MG capsule Take 1 capsule (300 mg total) by mouth 3 (three) times daily. 10/21/17 10/21/18  Sharman Cheek, MD  HYDROcodone-acetaminophen (NORCO/VICODIN) 5-325 MG tablet Take 1-2 tablets by mouth every 4 (four) hours as needed for moderate pain. 01/26/17   Loleta Rose, MD  hydrOXYzine (ATARAX/VISTARIL) 25 MG tablet Take 1 tablet (25 mg total) by mouth 3 (three) times daily as needed for anxiety. 12/25/16   Shari Prows, MD  ibuprofen (ADVIL,MOTRIN) 600 MG tablet Take 1 tablet by mouth three times daily with meals 01/26/17   Loleta RoseForbach, Cory, MD  lamoTRIgine (LAMICTAL) 200 MG tablet Take 1 tablet (200 mg total) by mouth at bedtime. 12/25/16   Pucilowska, Jolanta B, MD  LORazepam (ATIVAN) 1 MG tablet Take 1 tablet (1 mg total) by mouth every 6 (six) hours as needed for anxiety. 01/27/17 01/27/18  Rockne MenghiniNorman, Anne-Caroline, MD  ondansetron (ZOFRAN ODT) 4 MG disintegrating tablet Allow 1-2 tablets to dissolve in your mouth every 8 hours as needed for  nausea/vomiting 01/26/17   Loleta RoseForbach, Cory, MD  oxyCODONE-acetaminophen (ROXICET) 5-325 MG tablet Take 1 tablet by mouth every 4 (four) hours as needed for severe pain. 12/29/16   Arnaldo NatalMalinda, Paul F, MD  phenol (CHLORASEPTIC) 1.4 % LIQD 2 sprays by Mucous Membrane route every two (2) hours as needed. 02/24/16   [provider]  prazosin (MINIPRESS) 2 MG capsule Take 1 capsule (2 mg total) by mouth daily with breakfast. 12/26/16   Pucilowska, Jolanta B, MD  prochlorperazine (COMPAZINE) 10 MG tablet Take 1 tablet (10 mg total) by mouth every 6 (six) hours as needed for nausea or vomiting. 12/25/16   Pucilowska, Jolanta B, MD  QUEtiapine (SEROQUEL) 100 MG tablet Take 1 tablet (100 mg total) by mouth 2 (two) times daily. Take 100mg  in the morning and 200mg  at bedtime 10/21/17   Sharman CheekStafford, Kala Ambriz, MD     Allergies Gabapentin; Latex; Propoxyphene; Amoxicillin; Darvocet [propoxyphene n-acetaminophen]; Metoclopramide; Sulfa antibiotics; and Sulfacetamide sodium   Family History  Problem Relation Age of Onset  . Stroke Mother   . Cancer Father     Social History Social History   Tobacco Use  . Smoking status: Current Some Day Smoker    Packs/day: 0.50  . Smokeless tobacco: Never Used  Substance Use Topics  . Alcohol use: No  . Drug use: No    Review of Systems  Constitutional:   No fever or chills.  ENT:   No sore throat. No rhinorrhea. Cardiovascular:   No chest pain or syncope. Respiratory:   No dyspnea or cough. Gastrointestinal:   Negative for abdominal pain, vomiting and diarrhea.  Musculoskeletal:   Chronic hip pain due to arthritis All other systems reviewed and are negative except as documented above in ROS and HPI.  ____________________________________________   PHYSICAL EXAM:  VITAL SIGNS: ED Triage Vitals  Enc Vitals Group     BP 10/21/17 1156 (!) 151/75     Pulse Rate 10/21/17 1156 89     Resp 10/21/17 1156 20     Temp 10/21/17 1156 98.6 F (37 C)     Temp  Source 10/21/17 1156 Oral     SpO2 10/21/17 1156 94 %     Weight 10/21/17 1157 280 lb (127 kg)     Height 10/21/17 1157 5\' 7"  (1.702 m)     Head Circumference --      Peak Flow --      Pain Score 10/21/17 1157 9     Pain Loc --      Pain Edu? --      Excl. in GC? --     Vital signs reviewed, nursing assessments reviewed.   Constitutional:   Alert and oriented. Non-toxic appearance. Eyes:   Conjunctivae are normal. EOMI.  ENT      Head:   Normocephalic and atraumatic.       Mouth/Throat:   MMM, no pharyngeal erythema. No peritonsillar mass.  Neck:   No meningismus. Full ROM. Hematological/Lymphatic/Immunilogical:   No cervical lymphadenopathy. Cardiovascular:   RRR.  Respiratory: Normal work of breathing.  Clear to auscultation bilaterally. Gastrointestinal:   Soft and nontender. Non distended. There is no CVA tenderness.  No rebound, rigidity, or guarding.  Musculoskeletal:   Normal range of motion in all extremities. No joint effusions.  No lower extremity tenderness.  No edema. Neurologic:   Normal speech and language.  Motor grossly intact. No acute focal neurologic deficits are appreciated.  Skin:    Skin is warm, dry and intact.  No wounds ____________________________________________    LABS (pertinent positives/negatives) (all labs ordered are listed, but only abnormal results are displayed) Labs Reviewed  ACETAMINOPHEN LEVEL - Abnormal; Notable for the following components:      Result Value   Acetaminophen (Tylenol), Serum <10 (*)    All other components within normal limits  URINE DRUG SCREEN, QUALITATIVE ([facility] ONLY) - Abnormal; Notable for the following components:   Cannabinoid 50 Ng, Ur [place] POSITIVE (*)    Barbiturates, Ur Screen   (*)    Value: Result not available. Reagent lot number recalled by manufacturer.   All other components within normal limits  COMPREHENSIVE METABOLIC PANEL  ETHANOL  SALICYLATE LEVEL  CBC    ____________________________________________   EKG    ____________________________________________    RADIOLOGY  No results found.  ____________________________________________   PROCEDURES Procedures  ____________________________________________    CLINICAL IMPRESSION / ASSESSMENT AND PLAN / ED COURSE  Pertinent labs & imaging results that were available during my care of the patient were reviewed by me and considered in my medical decision making (see chart for details).      Clinical Course as of Oct 22 1518  Sat Oct 21, 2017  1313 Worsening of chronic depression symptoms related to death of a close friend a month ago.  No intent to harm herself or die.  No HI or hallucinations.  Not psychotic.  Will obtain psychiatry consult.  Not a danger to herself or others and has medical decision-making capacity.  Not appropriate for IVC at this time.   [PS]  1410 Discussed with Dr. Garnetta Buddy, who will consult now for psychiatry.  Pulse Rate: 89 [PS]    Clinical Course User Index [PS] Sharman Cheek, MD      ----------------------------------------- 3:20 PM on 10/21/2017 -----------------------------------------  Consult note reviewed, new prescriptions for medication changes provided per psychiatry recommendations.  Referral information for new psychiatry clinic provided.  Patient also states that she has contacted [facility] to seek a new provider.  Medically and psychiatrically stable at this time and not a danger to herself or others. ____________________________________________   FINAL CLINICAL IMPRESSION(S) / ED DIAGNOSES    Final diagnoses:  Depression, unspecified depression type  Chronic hip pain, unspecified laterality     ED Discharge Orders        Ordered    QUEtiapine (SEROQUEL) 100 MG tablet  2 times daily     10/21/17 1518    DULoxetine (CYMBALTA) 30 MG capsule  Daily     10/21/17 1518    gabapentin (NEURONTIN) 300 MG capsule  3  times daily     10/21/17 1518      Portions of this note were generated with dragon dictation software. Dictation errors may occur despite best attempts at proofreading.    Sharman Cheek, MD 10/21/17 1520

## 2017-10-21 NOTE — BH Assessment (Signed)
Per SOC, patient can be d/c.

## 2017-10-21 NOTE — ED Notes (Signed)
Pt discharged to lobby (friend here to pick her up). VS stable. Discharge paperwork and prescriptions reviewed. Pt denies SI/HI. All belongings returned to patient.

## 2017-10-21 NOTE — ED Notes (Signed)
Pt reports she has been havings feeling of hurting herself but denies a plan. States she will not hurt herself though but needs help with these feelings. States her medications the she takes reports them as cymbalta, seroquel and neurontin do not work for her. States outpatient [facility] is not helping either. Denied SI/HI/AVH on assessment. Able to contract for safety.

## 2017-10-21 NOTE — ED Notes (Addendum)
Pt requesting tylenol for hip pain (7/10). Pt stated she had a procedure when she was a child and her hip is now bone on bone. EDP notified. Pt calm and cooperative. Maintained on 15 minute checks and observation by security camera for safety.

## 2017-10-21 NOTE — ED Triage Notes (Signed)
Thoughts of harming self x 2 weeks. States had recent death of friend that was like mother figure.

## 2017-10-21 NOTE — ED Notes (Signed)
Pt to be discharged home. Maintained on 15 minute checks and observation by security camera for safety.

## 2017-10-21 NOTE — ED Notes (Signed)
PT VOLUNTARY PENDING SOC CONSULT/SOC CALLED TO JASMINE.

## 2017-11-30 ENCOUNTER — Emergency Department
Admission: EM | Admit: 2017-11-30 | Discharge: 2017-11-30 | Disposition: A | Discharge status: Home / Self Care | Payer: Self-pay | Attending: Emergency Medicine | Admitting: Emergency Medicine

## 2017-11-30 ENCOUNTER — Emergency Department: Payer: Self-pay

## 2017-11-30 DIAGNOSIS — G44309 Post-traumatic headache, unspecified, not intractable: Secondary | ICD-10-CM | POA: Insufficient documentation

## 2017-11-30 DIAGNOSIS — F0781 Postconcussional syndrome: Secondary | ICD-10-CM | POA: Insufficient documentation

## 2017-11-30 DIAGNOSIS — F17200 Nicotine dependence, unspecified, uncomplicated: Secondary | ICD-10-CM | POA: Insufficient documentation

## 2017-11-30 DIAGNOSIS — Z79899 Other long term (current) drug therapy: Secondary | ICD-10-CM | POA: Insufficient documentation

## 2017-11-30 DIAGNOSIS — W19XXXA Unspecified fall, initial encounter: Secondary | ICD-10-CM | POA: Insufficient documentation

## 2017-11-30 MED ORDER — ONDANSETRON HCL 4 MG PO TABS: 4.0000 mg | ORAL_TABLET | Freq: Every day | ORAL | 0 refills | Status: DC | PRN

## 2017-11-30 MED ORDER — BUTALBITAL-APAP-CAFFEINE 50-325-40 MG PO TABS
2.0000 | ORAL_TABLET | Freq: Once | ORAL | Status: AC
  Administered 2017-11-30: 2 via ORAL
  Filled 2017-11-30: qty 2

## 2017-11-30 MED ORDER — ONDANSETRON 4 MG PO TBDP
8.0000 mg | ORAL_TABLET | Freq: Once | ORAL | Status: AC
  Administered 2017-11-30: 8 mg via ORAL
  Filled 2017-11-30: qty 2

## 2017-11-30 MED ORDER — IBUPROFEN 600 MG PO TABS
600.0000 mg | ORAL_TABLET | Freq: Once | ORAL | Status: AC
  Administered 2017-11-30: 600 mg via ORAL
  Filled 2017-11-30: qty 1

## 2017-11-30 MED ORDER — BUTALBITAL-APAP-CAFFEINE 50-325-40 MG PO TABS: 1.0000 | ORAL_TABLET | Freq: Four times a day (QID) | ORAL | 0 refills | Status: DC | PRN

## 2017-11-30 NOTE — ED Provider Notes (Signed)
[facility] Emergency Department Provider Note  ____________________________________________   First MD Initiated Contact with Patient 11/30/17 2032     (approximate)  I have reviewed the triage vital signs and the nursing notes.   HISTORY  Chief Complaint Fall   HPI Tammy Robles is a 47 y.o. female who comes to the emergency department with roughly 1 week of headache, neck pain, difficulty concentrating, and nausea after hitting her head on concrete.  She was seen and evaluated at [facility] and was discharged at that point.  Her symptoms have been constant ever since.  They are moderate severity.  They are making it difficult for her to sleep.  Nothing seems to make it better or worse.    Past Medical History:  Diagnosis Date  . Bladder prolapse, female, acquired   . Endometriosis 1996  . Endometritis 1990   fever 105 post delivery  . Idiopathic intracranial hypertension   . Polycystic ovarian disease 1993    Patient Active Problem List   Diagnosis Date Noted  . Overweight 01/02/2017  . Tobacco use disorder 12/23/2016  . Bipolar 2 disorder (HCC) 12/20/2016  . Cannabis use disorder, moderate, dependence (HCC) 12/20/2016  . Migraine headache 12/20/2016  . Borderline personality disorder (HCC) 11/04/2014  . Anxiety and depression 03/23/2012  . PTSD (post-traumatic stress disorder) 03/29/2004    Past Surgical History:  Procedure Laterality Date  . ABDOMINAL HYSTERECTOMY    . 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 HIP ARTHROPLASTY    . hysterrectomy  1996  . LAPAROTOMY     multiple for cysts  . LEFT OOPHORECTOMY  2007  . mass off appendix    . RIGHT OOPHORECTOMY    . TONSILLECTOMY    . WRIST SURGERY  2007 and 2003   both tendon release    Prior to Admission medications   Medication Sig Start Date End Date Taking? Authorizing Provider  buPROPion (WELLBUTRIN XL) 150 MG 24 hr tablet  Take 300 mg by mouth. 12/01/16   [provider]  butalbital-acetaminophen-caffeine (FIORICET, ESGIC) 40-981-19 MG tablet Take 1-2 tablets by mouth every 6 (six) hours as needed for headache. 11/30/17 11/30/18  Merrily Brittle, MD  clonazePAM (KLONOPIN) 0.5 MG tablet Take 0.5 mg by mouth. 12/01/16   [provider]  cyclobenzaprine (FLEXERIL) 5 MG tablet Take 5 mg by mouth. 06/19/16   [provider]  diclofenac sodium (VOLTAREN) 1 % GEL Apply topically. 04/21/15   [provider]  DULoxetine (CYMBALTA) 30 MG capsule Take 2 capsules (60 mg total) by mouth daily. 10/21/17   Sharman Cheek, MD  estradiol (ESTRACE) 0.1 MG/GM vaginal cream Place vaginally. 12/07/15   [provider]  FLUoxetine (PROZAC) 40 MG capsule Take 80 mg by mouth. 12/01/16   [provider]  fluvoxaMINE (LUVOX) 50 MG tablet Take 3 tablets (150 mg total) by mouth at bedtime. 12/25/16   Pucilowska, Braulio Conte B, MD  gabapentin (NEURONTIN) 300 MG capsule Take 1 capsule (300 mg total) by mouth 3 (three) times daily. 10/21/17 10/21/18  Sharman Cheek, MD  HYDROcodone-acetaminophen (NORCO/VICODIN) 5-325 MG tablet Take 1-2 tablets by mouth every 4 (four) hours as needed for moderate pain. 01/26/17   Loleta Rose, MD  hydrOXYzine (ATARAX/VISTARIL) 25 MG tablet Take 1 tablet (25 mg total) by mouth 3 (three) times daily as needed for anxiety. 12/25/16   Pucilowska, Braulio Conte B, MD  ibuprofen (ADVIL,MOTRIN) 600 MG tablet Take  1 tablet by mouth three times daily with meals 01/26/17   Loleta Rose, MD  lamoTRIgine (LAMICTAL) 200 MG tablet Take 1 tablet (200 mg total) by mouth at bedtime. 12/25/16   Pucilowska, Jolanta B, MD  LORazepam (ATIVAN) 1 MG tablet Take 1 tablet (1 mg total) by mouth every 6 (six) hours as needed for anxiety. 01/27/17 01/27/18  Rockne Menghini, MD  ondansetron (ZOFRAN ODT) 4 MG disintegrating tablet Allow 1-2 tablets to dissolve in your mouth every 8 hours as needed for  nausea/vomiting 01/26/17   Loleta Rose, MD  ondansetron (ZOFRAN) 4 MG tablet Take 1 tablet (4 mg total) by mouth daily as needed. 11/30/17 11/30/18  Merrily Brittle, MD  oxyCODONE-acetaminophen (ROXICET) 5-325 MG tablet Take 1 tablet by mouth every 4 (four) hours as needed for severe pain. 12/29/16   Arnaldo Natal, MD  phenol (CHLORASEPTIC) 1.4 % LIQD 2 sprays by Mucous Membrane route every two (2) hours as needed. 02/24/16   [provider]  prazosin (MINIPRESS) 2 MG capsule Take 1 capsule (2 mg total) by mouth daily with breakfast. 12/26/16   Pucilowska, Jolanta B, MD  prochlorperazine (COMPAZINE) 10 MG tablet Take 1 tablet (10 mg total) by mouth every 6 (six) hours as needed for nausea or vomiting. 12/25/16   Pucilowska, Jolanta B, MD  QUEtiapine (SEROQUEL) 100 MG tablet Take 1 tablet (100 mg total) by mouth 2 (two) times daily. Take 100mg  in the morning and 200mg  at bedtime 10/21/17   Sharman Cheek, MD    Allergies Gabapentin; Latex; Propoxyphene; Amoxicillin; Darvocet [propoxyphene n-acetaminophen]; Metoclopramide; Sulfa antibiotics; and Sulfacetamide sodium  Family History  Problem Relation Age of Onset  . Stroke Mother   . Cancer Father     Social History Social History   Tobacco Use  . Smoking status: Current Some Day Smoker    Packs/day: 0.50  . Smokeless tobacco: Never Used  Substance Use Topics  . Alcohol use: No  . Drug use: No    Review of Systems Constitutional: No fever/chills Eyes: Positive for visual changes. ENT: No sore throat. Cardiovascular: Denies chest pain. Respiratory: Denies shortness of breath. Gastrointestinal: No abdominal pain.  Positive for nausea, no vomiting.  No diarrhea.  No constipation. Genitourinary: Negative for dysuria. Musculoskeletal: Negative for back pain. Skin: Negative for rash. Neurological: Positive for  ____________________________________________   PHYSICAL EXAM:  VITAL SIGNS: ED Triage Vitals  Enc Vitals  Group     BP 11/30/17 1930 129/83     Pulse Rate 11/30/17 1930 82     Resp 11/30/17 1930 18     Temp 11/30/17 1930 98.5 F (36.9 C)     Temp Source 11/30/17 1930 Oral     SpO2 11/30/17 1930 96 %     Weight --      Height --      Head Circumference --      Peak Flow --      Pain Score 11/30/17 1929 9     Pain Loc --      Pain Edu? --      Excl. in GC? --     Constitutional: Alert and oriented x4 appears obviously uncomfortable nontoxic no diaphoresis Eyes: PERRL EOMI. Head: Atraumatic. Nose: No congestion/rhinnorhea. Mouth/Throat: No trismus Neck: No stridor.  No meningismus Cardiovascular: Normal rate, regular rhythm. Grossly normal heart sounds.  Good peripheral circulation. Respiratory: Normal respiratory effort.  No retractions. Lungs CTAB and moving good air Gastrointestinal: Soft nontender Musculoskeletal: No lower extremity edema   Neurologic:  Normal speech and language. No gross focal neurologic deficits are appreciated. Skin:  Skin is warm, dry and intact. No rash noted. Psychiatric: Mood and affect are normal. Speech and behavior are normal.    ____________________________________________   DIFFERENTIAL includes but not limited to  Concussion, intracerebral hemorrhage, cervical spine fracture ____________________________________________   LABS (all labs ordered are listed, but only abnormal results are displayed)  Labs Reviewed - No data to display   __________________________________________  EKG   ____________________________________________  RADIOLOGY  CT scan of the head neck reviewed by me with no acute disease ____________________________________________   PROCEDURES  Procedure(s) performed: no  Procedures  Critical Care performed: no  ____________________________________________   INITIAL IMPRESSION / ASSESSMENT AND PLAN / ED COURSE  Pertinent labs & imaging results that were available during my care of the patient were  reviewed by me and considered in my medical decision making (see chart for details).   As part of my medical decision making, I reviewed the following data within the electronic MEDICAL RECORD NUMBER History obtained from family if available, nursing notes, old chart and ekg, as well as notes from prior ED visits.  The patient arrives with symptoms that are consistent with postconcussive syndrome.  She did not [facility] so imaging was obtained from triage which fortunately is negative.  She and I had a lengthy discussion regarding the expected course of concussion.  Given IV Compazine Benadryl Toradol here with improvement in her symptoms.  I will discharge her home with Fioricet and primary care follow-up.     ____________________________________________   FINAL CLINICAL IMPRESSION(S) / ED DIAGNOSES  Final diagnoses:  Post concussive syndrome      NEW MEDICATIONS STARTED DURING THIS VISIT:  Discharge Medication List as of 11/30/2017  9:00 PM    START taking these medications   Details  butalbital-acetaminophen-caffeine (FIORICET, ESGIC) 50-325-40 MG tablet Take 1-2 tablets by mouth every 6 (six) hours as needed for headache., Starting Thu 11/30/2017, Until Fri 11/30/2018, Print         Note:  This document was prepared using Dragon voice recognition software and may include unintentional dictation errors.     Merrily Brittleifenbark, Luda Charbonneau, MD 12/04/17 1308

## 2017-11-30 NOTE — ED Triage Notes (Addendum)
Patient c/o weakness, nausea, dry heaving, neck pain, headache, "floaters" in vision post fall on Saturday. Patient reports mechanical fall on Saturday. Patient struck her head on concrete. [facility], and had xrays of clavical and elbow performed; patient reports these scans negative.

## 2017-12-08 ENCOUNTER — Emergency Department (HOSPITAL_COMMUNITY)
Admission: EM | Admit: 2017-12-08 | Discharge: 2017-12-08 | Disposition: A | Discharge status: Home / Self Care | Payer: Self-pay | Attending: Emergency Medicine | Admitting: Emergency Medicine

## 2017-12-08 ENCOUNTER — Other Ambulatory Visit: Payer: Self-pay

## 2017-12-08 ENCOUNTER — Encounter (HOSPITAL_COMMUNITY): Payer: Self-pay

## 2017-12-08 ENCOUNTER — Emergency Department (HOSPITAL_COMMUNITY): Payer: Self-pay

## 2017-12-08 DIAGNOSIS — Z5321 Procedure and treatment not carried out due to patient leaving prior to being seen by health care provider: Secondary | ICD-10-CM | POA: Insufficient documentation

## 2017-12-08 DIAGNOSIS — M549 Dorsalgia, unspecified: Secondary | ICD-10-CM | POA: Insufficient documentation

## 2017-12-08 LAB — URINALYSIS, ROUTINE W REFLEX MICROSCOPIC
Bilirubin Urine: NEGATIVE
GLUCOSE, UA: NEGATIVE mg/dL
Ketones, ur: NEGATIVE mg/dL
NITRITE: NEGATIVE
Protein, ur: NEGATIVE mg/dL
Specific Gravity, Urine: 1.003 — ABNORMAL LOW (ref 1.005–1.030)
pH: 6 (ref 5.0–8.0)

## 2017-12-08 LAB — BASIC METABOLIC PANEL
ANION GAP: 8 (ref 5–15)
BUN: 7 mg/dL (ref 6–20)
CHLORIDE: 107 mmol/L (ref 98–111)
CO2: 27 mmol/L (ref 22–32)
Calcium: 9.5 mg/dL (ref 8.9–10.3)
Creatinine, Ser: 0.93 mg/dL (ref 0.44–1.00)
GFR calc Af Amer: >60 mL/min (ref >60)
GFR calc non Af Amer: >60 mL/min (ref >60)
Glucose, Bld: 93 mg/dL (ref 70–99)
POTASSIUM: 3.9 mmol/L (ref 3.5–5.1)
SODIUM: 142 mmol/L (ref 135–145)

## 2017-12-08 LAB — CBC
HEMATOCRIT: 40.4 % (ref 36.0–46.0)
HEMOGLOBIN: 13 g/dL (ref 12.0–15.0)
MCH: 29.3 pg (ref 26.0–34.0)
MCHC: 32.2 g/dL (ref 30.0–36.0)
MCV: 91 fL (ref 78.0–100.0)
Platelets: 312 10*3/uL (ref 150–400)
RBC: 4.44 MIL/uL (ref 3.87–5.11)
RDW: 12.9 % (ref 11.5–15.5)
WBC: 6.5 10*3/uL (ref 4.0–10.5)

## 2017-12-08 NOTE — ED Notes (Signed)
Pt transported to xray 

## 2017-12-08 NOTE — ED Triage Notes (Signed)
Pt states that she is getting over a concussion from previous fall. States that last night she fell again, denies LOC, reports that she did hit her head, and has been having back and hip pain since fall. No relief from tylenol or ibuprofen.

## 2017-12-11 NOTE — ED Notes (Signed)
Follow up call made,  Pt wants imaging results,  Will call med records or look up on my chart  12/11/17//1020  s Cylee Dattilo rn

## 2017-12-27 ENCOUNTER — Emergency Department
Admission: EM | Admit: 2017-12-27 | Discharge: 2017-12-27 | Disposition: A | Discharge status: Home / Self Care | Payer: Medicaid Other | Attending: Student in an Organized Health Care Education/Training Program | Admitting: Student in an Organized Health Care Education/Training Program

## 2017-12-27 ENCOUNTER — Emergency Department: Payer: Medicaid Other

## 2017-12-27 ENCOUNTER — Other Ambulatory Visit: Payer: Self-pay

## 2017-12-27 ENCOUNTER — Encounter: Payer: Self-pay | Admitting: Emergency Medicine

## 2017-12-27 DIAGNOSIS — M545 Low back pain: Secondary | ICD-10-CM | POA: Diagnosis not present

## 2017-12-27 DIAGNOSIS — M542 Cervicalgia: Secondary | ICD-10-CM | POA: Diagnosis not present

## 2017-12-27 DIAGNOSIS — W19XXXA Unspecified fall, initial encounter: Secondary | ICD-10-CM

## 2017-12-27 DIAGNOSIS — M25551 Pain in right hip: Secondary | ICD-10-CM | POA: Diagnosis not present

## 2017-12-27 DIAGNOSIS — F17210 Nicotine dependence, cigarettes, uncomplicated: Secondary | ICD-10-CM | POA: Diagnosis not present

## 2017-12-27 DIAGNOSIS — S0990XA Unspecified injury of head, initial encounter: Secondary | ICD-10-CM

## 2017-12-27 DIAGNOSIS — R51 Headache: Secondary | ICD-10-CM | POA: Diagnosis not present

## 2017-12-27 DIAGNOSIS — Z79899 Other long term (current) drug therapy: Secondary | ICD-10-CM | POA: Diagnosis not present

## 2017-12-27 LAB — TROPONIN I: Troponin I: <0.03 ng/mL (ref <0.03)

## 2017-12-27 LAB — CBC WITH DIFFERENTIAL/PLATELET
Basophils Absolute: 0.2 10*3/uL — ABNORMAL HIGH (ref 0–0.1)
Basophils Relative: 3 %
Eosinophils Absolute: 0.2 10*3/uL (ref 0–0.7)
Eosinophils Relative: 2 %
HEMATOCRIT: 39.5 % (ref 35.0–47.0)
HEMOGLOBIN: 13.8 g/dL (ref 12.0–16.0)
LYMPHS PCT: 47 %
Lymphs Abs: 3.5 10*3/uL (ref 1.0–3.6)
MCH: 31.1 pg (ref 26.0–34.0)
MCHC: 35 g/dL (ref 32.0–36.0)
MCV: 88.8 fL (ref 80.0–100.0)
MONOS PCT: 6 %
Monocytes Absolute: 0.4 10*3/uL (ref 0.2–0.9)
NEUTROS ABS: 3 10*3/uL (ref 1.4–6.5)
Neutrophils Relative %: 42 %
Platelets: 301 10*3/uL (ref 150–440)
RBC: 4.44 MIL/uL (ref 3.80–5.20)
RDW: 13.5 % (ref 11.5–14.5)
WBC: 7.2 10*3/uL (ref 3.6–11.0)

## 2017-12-27 LAB — COMPREHENSIVE METABOLIC PANEL
ALK PHOS: 73 U/L (ref 38–126)
ALT: 20 U/L (ref 0–44)
AST: 33 U/L (ref 15–41)
Albumin: 4.4 g/dL (ref 3.5–5.0)
Anion gap: 11 (ref 5–15)
BILIRUBIN TOTAL: 0.2 mg/dL — AB (ref 0.3–1.2)
BUN: 11 mg/dL (ref 6–20)
CALCIUM: 9.3 mg/dL (ref 8.9–10.3)
CO2: 25 mmol/L (ref 22–32)
CREATININE: 0.86 mg/dL (ref 0.44–1.00)
Chloride: 104 mmol/L (ref 98–111)
GFR calc non Af Amer: >60 mL/min (ref >60)
Glucose, Bld: 73 mg/dL (ref 70–99)
Potassium: 3.4 mmol/L — ABNORMAL LOW (ref 3.5–5.1)
Sodium: 140 mmol/L (ref 135–145)
TOTAL PROTEIN: 7.8 g/dL (ref 6.5–8.1)

## 2017-12-27 MED ORDER — ONDANSETRON HCL 4 MG/2ML IJ SOLN
4.0000 mg | Freq: Once | INTRAMUSCULAR | Status: AC
  Administered 2017-12-27: 4 mg via INTRAVENOUS
  Filled 2017-12-27: qty 2

## 2017-12-27 MED ORDER — CYCLOBENZAPRINE HCL 10 MG PO TABS
5.0000 mg | ORAL_TABLET | Freq: Once | ORAL | Status: AC
  Administered 2017-12-27: 5 mg via ORAL
  Filled 2017-12-27: qty 1

## 2017-12-27 MED ORDER — MORPHINE SULFATE (PF) 4 MG/ML IV SOLN
4.0000 mg | INTRAVENOUS | Status: DC | PRN
  Administered 2017-12-27: 4 mg via INTRAVENOUS
  Filled 2017-12-27: qty 1

## 2017-12-27 MED ORDER — BUTALBITAL-APAP-CAFFEINE 50-325-40 MG PO TABS
1.0000 | ORAL_TABLET | Freq: Once | ORAL | Status: AC
  Administered 2017-12-27: 1 via ORAL
  Filled 2017-12-27: qty 1

## 2017-12-27 MED ORDER — BUTALBITAL-APAP-CAFFEINE 50-325-40 MG PO TABS: 1.0000 | ORAL_TABLET | Freq: Four times a day (QID) | ORAL | 0 refills | Status: DC | PRN

## 2017-12-27 MED ORDER — CYCLOBENZAPRINE HCL 10 MG PO TABS: 10.0000 mg | ORAL_TABLET | Freq: Three times a day (TID) | ORAL | 0 refills | Status: DC | PRN

## 2017-12-27 NOTE — ED Provider Notes (Signed)
[facility] Emergency Department Provider Note    First MD Initiated Contact with Patient 12/27/17 2026     (approximate)  I have reviewed the triage vital signs and the nursing notes.   HISTORY  Chief Complaint Fall    HPI Rosalee KaufmanKelly L Tarango is a 47 y.o. female with history of recent diagnosis of minor head injury with concussion after mechanical fall roughly 1 month ago he presents the ER today after she was states that she tripped and fell hitting her head on the ground complaining of severe headache as well as neck pain and right hip pain and low back pain.  States that her worst pain is in her head and neck.  Denies any numbness or tingling.  Denies any chest pain or shortness of breath.  Is not on any blood thinners.  Arrives in c-collar via EMS.  He did have LOC and is uncertain of duration of time.    Past Medical History:  Diagnosis Date  . Bladder prolapse, female, acquired   . Endometriosis 1996  . Endometritis 1990   fever 105 post delivery  . Idiopathic intracranial hypertension   . Polycystic ovarian disease 1993   Family History  Problem Relation Age of Onset  . Stroke Mother   . Cancer Father    Past Surgical History:  Procedure Laterality Date  . ABDOMINAL HYSTERECTOMY    . 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 HIP ARTHROPLASTY    . hysterrectomy  1996  . LAPAROTOMY     multiple for cysts  . LEFT OOPHORECTOMY  2007  . mass off appendix    . RIGHT OOPHORECTOMY    . TONSILLECTOMY    . WRIST SURGERY  2007 and 2003   both tendon release   Patient Active Problem List   Diagnosis Date Noted  . Overweight 01/02/2017  . Tobacco use disorder 12/23/2016  . Bipolar 2 disorder (HCC) 12/20/2016  . Cannabis use disorder, moderate, dependence (HCC) 12/20/2016  . Migraine headache 12/20/2016  . Borderline personality disorder (HCC) 11/04/2014  . Anxiety and depression 03/23/2012  . PTSD  (post-traumatic stress disorder) 03/29/2004      Prior to Admission medications   Medication Sig Start Date End Date Taking? Authorizing Provider  buPROPion (WELLBUTRIN XL) 150 MG 24 hr tablet Take 300 mg by mouth. 12/01/16   [provider]  butalbital-acetaminophen-caffeine (FIORICET, ESGIC) 16-109-6050-325-40 MG tablet Take 1-2 tablets by mouth every 6 (six) hours as needed for headache. 11/30/17 11/30/18  Merrily Brittleifenbark, Neil, MD  clonazePAM (KLONOPIN) 0.5 MG tablet Take 0.5 mg by mouth. 12/01/16   [provider]  cyclobenzaprine (FLEXERIL) 5 MG tablet Take 5 mg by mouth. 06/19/16   [provider]  diclofenac sodium (VOLTAREN) 1 % GEL Apply topically. 04/21/15   [provider]  DULoxetine (CYMBALTA) 30 MG capsule Take 2 capsules (60 mg total) by mouth daily. 10/21/17   Sharman CheekStafford, Phillip, MD  estradiol (ESTRACE) 0.1 MG/GM vaginal cream Place vaginally. 12/07/15   [provider]  FLUoxetine (PROZAC) 40 MG capsule Take 80 mg by mouth. 12/01/16   [provider]  fluvoxaMINE (LUVOX) 50 MG tablet Take 3 tablets (150 mg total) by mouth at bedtime. 12/25/16   Pucilowska, Braulio ConteJolanta B, MD  gabapentin (NEURONTIN) 300 MG capsule Take 1 capsule (300 mg total) by mouth 3 (three) times daily. 10/21/17 10/21/18  Sharman CheekStafford, Phillip, MD  HYDROcodone-acetaminophen (NORCO/VICODIN) 5-325 MG tablet Take  1-2 tablets by mouth every 4 (four) hours as needed for moderate pain. 01/26/17   Loleta Rose, MD  hydrOXYzine (ATARAX/VISTARIL) 25 MG tablet Take 1 tablet (25 mg total) by mouth 3 (three) times daily as needed for anxiety. 12/25/16   Pucilowska, Ellin Goodie, MD  ibuprofen (ADVIL,MOTRIN) 600 MG tablet Take 1 tablet by mouth three times daily with meals 01/26/17   Loleta Rose, MD  lamoTRIgine (LAMICTAL) 200 MG tablet Take 1 tablet (200 mg total) by mouth at bedtime. 12/25/16   Pucilowska, Jolanta B, MD  LORazepam (ATIVAN) 1 MG tablet Take 1 tablet (1 mg total) by mouth every 6 (six)  hours as needed for anxiety. 01/27/17 01/27/18  Rockne Menghini, MD  ondansetron (ZOFRAN ODT) 4 MG disintegrating tablet Allow 1-2 tablets to dissolve in your mouth every 8 hours as needed for nausea/vomiting 01/26/17   Loleta Rose, MD  ondansetron (ZOFRAN) 4 MG tablet Take 1 tablet (4 mg total) by mouth daily as needed. 11/30/17 11/30/18  Merrily Brittle, MD  oxyCODONE-acetaminophen (ROXICET) 5-325 MG tablet Take 1 tablet by mouth every 4 (four) hours as needed for severe pain. 12/29/16   Arnaldo Natal, MD  phenol (CHLORASEPTIC) 1.4 % LIQD 2 sprays by Mucous Membrane route every two (2) hours as needed. 02/24/16   [provider]  prazosin (MINIPRESS) 2 MG capsule Take 1 capsule (2 mg total) by mouth daily with breakfast. 12/26/16   Pucilowska, Jolanta B, MD  prochlorperazine (COMPAZINE) 10 MG tablet Take 1 tablet (10 mg total) by mouth every 6 (six) hours as needed for nausea or vomiting. 12/25/16   Pucilowska, Jolanta B, MD  QUEtiapine (SEROQUEL) 100 MG tablet Take 1 tablet (100 mg total) by mouth 2 (two) times daily. Take 100mg  in the morning and 200mg  at bedtime 10/21/17   Sharman Cheek, MD    Allergies Gabapentin; Latex; Propoxyphene; Amoxicillin; Darvocet [propoxyphene n-acetaminophen]; Metoclopramide; Sulfa antibiotics; and Sulfacetamide sodium    Social History Social History   Tobacco Use  . Smoking status: Current Some Day Smoker    Packs/day: 0.50  . Smokeless tobacco: Never Used  Substance Use Topics  . Alcohol use: No  . Drug use: No    Review of Systems Patient denies headaches, rhinorrhea, blurry vision, numbness, shortness of breath, chest pain, edema, cough, abdominal pain, nausea, vomiting, diarrhea, dysuria, fevers, rashes or hallucinations unless otherwise stated above in HPI. ____________________________________________   PHYSICAL EXAM:  VITAL SIGNS: Vitals:   12/27/17 2036 12/27/17 2039  BP:  110/63  Pulse:  80  Resp:  20  Temp:  98.2 F  (36.8 C)  SpO2: 96% 100%    Constitutional: Alert and oriented.  Eyes: Conjunctivae are normal.  Head: Atraumatic. Nose: No congestion/rhinnorhea. Mouth/Throat: Mucous membranes are moist.   Neck: No stridor. Bilateral paracervical ttp, no step offs or deformities Cardiovascular: Normal rate, regular rhythm. Grossly normal heart sounds.  Good peripheral circulation. Respiratory: Normal respiratory effort.  No retractions. Lungs CTAB. Gastrointestinal: Soft and nontender. No distention. No abdominal bruits. No CVA tenderness. Genitourinary: deferred Musculoskeletal: right leg held in flexion, no obvious deformity  No joint effusions. Neurologic:  Normal speech and language. No gross focal neurologic deficits are appreciated. No facial droop Skin:  Skin is warm, dry and intact. No rash noted. Psychiatric: Mood and affect are normal. Speech and behavior are normal.  ____________________________________________   LABS (all labs ordered are listed, but only abnormal results are displayed)  No results found for this or any previous visit (from the  past 24 hour(s)). ____________________________________________  EKG My review and personal interpretation at Time: 22:00   Indication: fall  Rate: 75  Rhythm: sinus Axis: normal Other: normal intervals, no stemi ____________________________________________  RADIOLOGY  I personally reviewed all radiographic images ordered to evaluate for the above acute complaints and reviewed radiology reports and findings.  These findings were personally discussed with the patient.  Please see medical record for radiology report.  ____________________________________________   PROCEDURES  Procedure(s) performed:  Procedures    Critical Care performed: no ____________________________________________   INITIAL IMPRESSION / ASSESSMENT AND PLAN / ED COURSE  Pertinent labs & imaging results that were available during my care of the patient were  reviewed by me and considered in my medical decision making (see chart for details).   DDX: fracture, dislocation, contusion, sdh, iph, sah, concussion  Rosalee KaufmanKelly L Mcmanamon is a 47 y.o. who presents to the ED with fall as described above.  She is currently afebrile and hemodynamically stable.  Based on her discomfort we will give IV pain medication.  Blood work will be sent for the above differential.  EKG shows no evidence of dysrhythmia or acute ischemia.  Seems most clinically consistent with mechanical fall.  Radiographs and CT imaging will be ordered to evaluate for the above complaints.  Clinical Course as of Dec 28 2207  Wed Dec 27, 2017  2156 Imaging is reassuring.  Patient's pain improving.  C-spine cleared.  Will ambulate a p.o. challenge.   [PR]    Clinical Course User Index [PR] Willy Eddyobinson, Cane Dubray, MD   ----------------------------------------- 10:23 PM on 12/27/2017 -----------------------------------------   patient able to ambulate with steady gait.  Pain controlled.  Discussed follow-up with neurology due to concern for postconcussive syndrome p particularly since this is her second hit and 1 month.  As part of my medical decision making, I reviewed the following data within the electronic MEDICAL RECORD NUMBER Nursing notes reviewed and incorporated, Labs reviewed, notes from prior ED visits  ____________________________________________   FINAL CLINICAL IMPRESSION(S) / ED DIAGNOSES  Final diagnoses:  Fall, initial encounter  Injury of head, initial encounter      NEW MEDICATIONS STARTED DURING THIS VISIT:  New Prescriptions   No medications on file     Note:  This document was prepared using Dragon voice recognition software and may include unintentional dictation errors.    Willy Eddyobinson, Terrisa Curfman, MD 12/27/17 2223

## 2017-12-27 NOTE — ED Triage Notes (Signed)
Patient to ER for c/o fall at home. Patient is unsure how event happened, but denies LOC. Patient to ER via [place] with c/o lower back pain after fall, as well as severe HA. Patient also c/o right hip pain, but states "it's bone on bone in there already". Patient reports spasm from lower back to T-spine with +mild nausea. Patient has h/o fall one month ago with concussion. Denies any other falls or complaints.

## 2017-12-27 NOTE — Discharge Instructions (Signed)

## 2018-01-12 ENCOUNTER — Emergency Department: Payer: Medicaid Other

## 2018-01-12 ENCOUNTER — Encounter: Payer: Self-pay | Admitting: Emergency Medicine

## 2018-01-12 ENCOUNTER — Emergency Department
Admission: EM | Admit: 2018-01-12 | Discharge: 2018-01-12 | Disposition: A | Discharge status: Home / Self Care | Payer: Medicaid Other | Attending: Emergency Medicine | Admitting: Emergency Medicine

## 2018-01-12 DIAGNOSIS — R079 Chest pain, unspecified: Secondary | ICD-10-CM | POA: Diagnosis present

## 2018-01-12 DIAGNOSIS — N390 Urinary tract infection, site not specified: Secondary | ICD-10-CM | POA: Insufficient documentation

## 2018-01-12 DIAGNOSIS — Z91040 Latex allergy status: Secondary | ICD-10-CM | POA: Diagnosis not present

## 2018-01-12 DIAGNOSIS — F17200 Nicotine dependence, unspecified, uncomplicated: Secondary | ICD-10-CM | POA: Diagnosis not present

## 2018-01-12 DIAGNOSIS — F419 Anxiety disorder, unspecified: Secondary | ICD-10-CM | POA: Diagnosis not present

## 2018-01-12 DIAGNOSIS — Z96649 Presence of unspecified artificial hip joint: Secondary | ICD-10-CM | POA: Diagnosis not present

## 2018-01-12 DIAGNOSIS — R55 Syncope and collapse: Secondary | ICD-10-CM | POA: Insufficient documentation

## 2018-01-12 DIAGNOSIS — F1220 Cannabis dependence, uncomplicated: Secondary | ICD-10-CM | POA: Diagnosis not present

## 2018-01-12 DIAGNOSIS — F319 Bipolar disorder, unspecified: Secondary | ICD-10-CM | POA: Diagnosis not present

## 2018-01-12 DIAGNOSIS — Z79899 Other long term (current) drug therapy: Secondary | ICD-10-CM | POA: Diagnosis not present

## 2018-01-12 LAB — URINALYSIS, COMPLETE (UACMP) WITH MICROSCOPIC
Bilirubin Urine: NEGATIVE
GLUCOSE, UA: NEGATIVE mg/dL
KETONES UR: NEGATIVE mg/dL
NITRITE: NEGATIVE
Protein, ur: NEGATIVE mg/dL
Specific Gravity, Urine: 1.015 (ref 1.005–1.030)
pH: 5 (ref 5.0–8.0)

## 2018-01-12 LAB — CBC
HEMATOCRIT: 36.7 % (ref 35.0–47.0)
HEMOGLOBIN: 12.8 g/dL (ref 12.0–16.0)
MCH: 30.9 pg (ref 26.0–34.0)
MCHC: 35 g/dL (ref 32.0–36.0)
MCV: 88.3 fL (ref 80.0–100.0)
Platelets: 291 10*3/uL (ref 150–440)
RBC: 4.16 MIL/uL (ref 3.80–5.20)
RDW: 13.1 % (ref 11.5–14.5)
WBC: 7.2 10*3/uL (ref 3.6–11.0)

## 2018-01-12 LAB — BASIC METABOLIC PANEL
ANION GAP: 7 (ref 5–15)
BUN: 15 mg/dL (ref 6–20)
CO2: 25 mmol/L (ref 22–32)
Calcium: 9.1 mg/dL (ref 8.9–10.3)
Chloride: 108 mmol/L (ref 98–111)
Creatinine, Ser: 0.89 mg/dL (ref 0.44–1.00)
GLUCOSE: 142 mg/dL — AB (ref 70–99)
POTASSIUM: 3.7 mmol/L (ref 3.5–5.1)
Sodium: 140 mmol/L (ref 135–145)

## 2018-01-12 LAB — TROPONIN I: Troponin I: <0.03 ng/mL (ref <0.03)

## 2018-01-12 MED ORDER — NITROFURANTOIN MONOHYD MACRO 100 MG PO CAPS: 100.0000 mg | ORAL_CAPSULE | Freq: Two times a day (BID) | ORAL | 0 refills | Status: DC

## 2018-01-12 MED ORDER — NITROFURANTOIN MONOHYD MACRO 100 MG PO CAPS
100.0000 mg | ORAL_CAPSULE | Freq: Once | ORAL | Status: AC
  Administered 2018-01-12: 100 mg via ORAL
  Filled 2018-01-12: qty 1

## 2018-01-12 MED ORDER — ACETAMINOPHEN 325 MG PO TABS
650.0000 mg | ORAL_TABLET | Freq: Once | ORAL | Status: AC
  Administered 2018-01-12: 650 mg via ORAL
  Filled 2018-01-12: qty 2

## 2018-01-12 MED ORDER — KETOROLAC TROMETHAMINE 30 MG/ML IJ SOLN
30.0000 mg | Freq: Once | INTRAMUSCULAR | Status: AC
  Administered 2018-01-12: 30 mg via INTRAVENOUS
  Filled 2018-01-12: qty 1

## 2018-01-12 NOTE — ED Provider Notes (Signed)
[facility] Emergency Department Provider Note ____________________________________________   First MD Initiated Contact with Patient 01/12/18 2003     (approximate)  I have reviewed the triage vital signs and the nursing notes.   HISTORY  Chief Complaint Chest Pain and Loss of Consciousness  HPI Tammy Robles is a 47 y.o. female with a history of bipolar disorder, idiopathic intracranial hypertension anxiety depression as well as PTSD was presented emergency department with chest pain since 4 PM.  Says the chest pain is in the middle the left side of the chest and is sharp and a 7 out of 10.  She says it lasts anywhere between a second and a minute and is not brought on by exertion.  Says it comes and goes without warning.  Denies any shortness of breath, nausea or vomiting.  However, she does state that after getting about the toilet this afternoon she felt like she "blacked out for second" and then fell into a cabinet, hitting the back of her head and sliding to the ground.  She says that she also has posterior head pain at this time.  Does not report any neck pain.  Says that she is a family history of heart disease but has never been diagnosed with a heart condition herself.  Is not on any hormone supplementation.  Past Medical History:  Diagnosis Date  . Bladder prolapse, female, acquired   . Endometriosis 1996  . Endometritis 1990   fever 105 post delivery  . Idiopathic intracranial hypertension   . Polycystic ovarian disease 1993    Patient Active Problem List   Diagnosis Date Noted  . Overweight 01/02/2017  . Tobacco use disorder 12/23/2016  . Bipolar 2 disorder (HCC) 12/20/2016  . Cannabis use disorder, moderate, dependence (HCC) 12/20/2016  . Migraine headache 12/20/2016  . Borderline personality disorder (HCC) 11/04/2014  . Anxiety and depression 03/23/2012  . PTSD (post-traumatic stress disorder) 03/29/2004    Past Surgical History:    Procedure Laterality Date  . ABDOMINAL HYSTERECTOMY    . 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 HIP ARTHROPLASTY    . hysterrectomy  1996  . LAPAROTOMY     multiple for cysts  . LEFT OOPHORECTOMY  2007  . mass off appendix    . RIGHT OOPHORECTOMY    . TONSILLECTOMY    . WRIST SURGERY  2007 and 2003   both tendon release    Prior to Admission medications   Medication Sig Start Date End Date Taking? Authorizing Provider  buPROPion (WELLBUTRIN XL) 150 MG 24 hr tablet Take 300 mg by mouth. 12/01/16   [provider]  butalbital-acetaminophen-caffeine (FIORICET, ESGIC) 70-623-76 MG tablet Take 1-2 tablets by mouth every 6 (six) hours as needed for headache. 12/27/17 12/27/18  Willy Eddy, MD  clonazePAM (KLONOPIN) 0.5 MG tablet Take 0.5 mg by mouth. 12/01/16   [provider]  cyclobenzaprine (FLEXERIL) 10 MG tablet Take 1 tablet (10 mg total) by mouth 3 (three) times daily as needed for muscle spasms. 12/27/17   Willy Eddy, MD  cyclobenzaprine (FLEXERIL) 5 MG tablet Take 5 mg by mouth. 06/19/16   [provider]  diclofenac sodium (VOLTAREN) 1 % GEL Apply topically. 04/21/15   [provider]  DULoxetine (CYMBALTA) 30 MG capsule Take 2 capsules (60 mg total) by mouth daily. 10/21/17   Sharman Cheek, MD  estradiol (ESTRACE) 0.1 MG/GM vaginal cream Place vaginally. 12/07/15  [provider]  FLUoxetine (PROZAC) 40 MG capsule Take 80 mg by mouth. 12/01/16   [provider]  fluvoxaMINE (LUVOX) 50 MG tablet Take 3 tablets (150 mg total) by mouth at bedtime. 12/25/16   Pucilowska, Braulio Conte B, MD  gabapentin (NEURONTIN) 300 MG capsule Take 1 capsule (300 mg total) by mouth 3 (three) times daily. 10/21/17 10/21/18  Sharman Cheek, MD  HYDROcodone-acetaminophen (NORCO/VICODIN) 5-325 MG tablet Take 1-2 tablets by mouth every 4 (four) hours as needed for moderate pain. 01/26/17   Loleta Rose, MD   hydrOXYzine (ATARAX/VISTARIL) 25 MG tablet Take 1 tablet (25 mg total) by mouth 3 (three) times daily as needed for anxiety. 12/25/16   Pucilowska, Ellin Goodie, MD  ibuprofen (ADVIL,MOTRIN) 600 MG tablet Take 1 tablet by mouth three times daily with meals 01/26/17   Loleta Rose, MD  lamoTRIgine (LAMICTAL) 200 MG tablet Take 1 tablet (200 mg total) by mouth at bedtime. 12/25/16   Pucilowska, Jolanta B, MD  LORazepam (ATIVAN) 1 MG tablet Take 1 tablet (1 mg total) by mouth every 6 (six) hours as needed for anxiety. 01/27/17 01/27/18  Rockne Menghini, MD  ondansetron (ZOFRAN ODT) 4 MG disintegrating tablet Allow 1-2 tablets to dissolve in your mouth every 8 hours as needed for nausea/vomiting 01/26/17   Loleta Rose, MD  ondansetron (ZOFRAN) 4 MG tablet Take 1 tablet (4 mg total) by mouth daily as needed. 11/30/17 11/30/18  Merrily Brittle, MD  oxyCODONE-acetaminophen (ROXICET) 5-325 MG tablet Take 1 tablet by mouth every 4 (four) hours as needed for severe pain. 12/29/16   Arnaldo Natal, MD  phenol (CHLORASEPTIC) 1.4 % LIQD 2 sprays by Mucous Membrane route every two (2) hours as needed. 02/24/16   [provider]  prazosin (MINIPRESS) 2 MG capsule Take 1 capsule (2 mg total) by mouth daily with breakfast. 12/26/16   Pucilowska, Jolanta B, MD  prochlorperazine (COMPAZINE) 10 MG tablet Take 1 tablet (10 mg total) by mouth every 6 (six) hours as needed for nausea or vomiting. 12/25/16   Pucilowska, Jolanta B, MD  QUEtiapine (SEROQUEL) 100 MG tablet Take 1 tablet (100 mg total) by mouth 2 (two) times daily. Take 100mg  in the morning and 200mg  at bedtime 10/21/17   Sharman Cheek, MD    Allergies Gabapentin; Latex; Propoxyphene; Amoxicillin; Darvocet [propoxyphene n-acetaminophen]; Metoclopramide; Sulfa antibiotics; and Sulfacetamide sodium  Family History  Problem Relation Age of Onset  . Stroke Mother   . Cancer Father     Social History Social History   Tobacco Use  . Smoking  status: Current Some Day Smoker    Packs/day: 0.50  . Smokeless tobacco: Never Used  Substance Use Topics  . Alcohol use: No  . Drug use: No    Review of Systems  Constitutional: No fever/chills Eyes: No visual changes. ENT: No sore throat. Cardiovascular: As above Respiratory: Denies shortness of breath. Gastrointestinal: No abdominal pain.  No nausea, no vomiting.  No diarrhea.  No constipation. Genitourinary: Negative for dysuria. Musculoskeletal: Negative for back pain. Skin: Negative for rash. Neurological: Negative for focal weakness or numbness.   ____________________________________________   PHYSICAL EXAM:  VITAL SIGNS: ED Triage Vitals [01/12/18 1908]  Enc Vitals Group     BP (!) 100/43     Pulse Rate 95     Resp 18     Temp 98.2 F (36.8 C)     Temp Source Oral     SpO2 97 %     Weight  Height      Head Circumference      Peak Flow      Pain Score      Pain Loc      Pain Edu?      Excl. in GC?     Constitutional: Alert and oriented. Well appearing and in no acute distress. Eyes: Conjunctivae are normal.  Head: Atraumatic.  Although the patient says that her pain is to the right occipital parietal region, there is no hematoma.  No depression or bogginess.  No tenderness to palpation. Nose: No congestion/rhinnorhea. Mouth/Throat: Mucous membranes are moist.  Neck: No stridor.   Cardiovascular: Normal rate, regular rhythm. Grossly normal heart sounds.  Chest pain not reproducible to palpation. Respiratory: Normal respiratory effort.  No retractions. Lungs CTAB. Gastrointestinal: Soft and nontender. No distention Musculoskeletal: No lower extremity tenderness nor edema.  No joint effusions. Neurologic:  Normal speech and language. No gross focal neurologic deficits are appreciated. Skin:  Skin is warm, dry and intact. No rash noted. Psychiatric: Mood and affect are normal. Speech and behavior are  normal.  ____________________________________________   LABS (all labs ordered are listed, but only abnormal results are displayed)  Labs Reviewed  BASIC METABOLIC PANEL - Abnormal; Notable for the following components:      Result Value   Glucose, Bld 142 (*)    All other components within normal limits  URINALYSIS, COMPLETE (UACMP) WITH MICROSCOPIC - Abnormal; Notable for the following components:   Color, Urine YELLOW (*)    APPearance CLEAR (*)    Hgb urine dipstick SMALL (*)    Leukocytes, UA TRACE (*)    Bacteria, UA MANY (*)    All other components within normal limits  URINE CULTURE  CBC  TROPONIN I  TROPONIN I   ____________________________________________  EKG  ED ECG REPORT I, Arelia Longest, the attending physician, personally viewed and interpreted this ECG.   Date: 01/12/2018  EKG Time: 1903  Rate: 94  Rhythm: normal sinus rhythm  Axis: Dermal  Intervals:none  ST&T Change: No ST segment elevation or depression.  No abnormal T wave inversion. Q waves in 1 and aVL are unchanged from previous. ____________________________________________  RADIOLOGY  No acute finding on the chest x-ray ____________________________________________   PROCEDURES  Procedure(s) performed:   Procedures  Critical Care performed:   ____________________________________________   INITIAL IMPRESSION / ASSESSMENT AND PLAN / ED COURSE  Pertinent labs & imaging results that were available during my care of the patient were reviewed by me and considered in my medical decision making (see chart for details).  Differential diagnosis includes, but is not limited to, ACS, aortic dissection, pulmonary embolism, cardiac tamponade, pneumothorax, pneumonia, pericarditis, myocarditis, GI-related causes including esophagitis/gastritis, and musculoskeletal chest wall pain.   As part of my medical decision making, I reviewed the following data within the electronic MEDICAL RECORD NUMBER  Notes from prior ED visits  PERC negative. [place] head ct negative.   ----------------------------------------- 10:57 PM on 01/12/2018 -----------------------------------------  Patient with second troponin negative.  Pain relieved with Toradol.  UTI on urinalysis.  Will discharge with antibiotics.  Unclear cause of the chest pain.  PE RC negative.  Atypical for ACS and very reassuring work-up.  Patient to follow-up with cardiology as an outpatient.  Also give her primary care follow-up.  She is understanding of the diagnosis as well as treatment and willing to comply. ____________________________________________   FINAL CLINICAL IMPRESSION(S) / ED DIAGNOSES  Chest pain.  Syncope.  UTI.  NEW MEDICATIONS STARTED DURING THIS VISIT:  New Prescriptions   No medications on file     Note:  This document was prepared using Dragon voice recognition software and may include unintentional dictation errors.     Myrna Blazer, MD 01/12/18 2322

## 2018-01-12 NOTE — ED Triage Notes (Signed)
This is patient's 3rd fall within 6 weeks.

## 2018-01-12 NOTE — ED Notes (Signed)
Patient transported to X-ray 

## 2018-01-12 NOTE — ED Triage Notes (Signed)
Patient presents to the ED with left sided sharp, stabbing chest pain that began around 4pm.  Patient states she had a syncopal episode while on the toilet approx. 45 min prior to arrival.  Patient states she hit her head on a cabinet as she passed out.  Patient is complaining of headache.  Patient appears slightly pale.

## 2018-01-15 LAB — URINE CULTURE: Culture: >=100000 — AB

## 2018-01-17 ENCOUNTER — Encounter: Payer: Self-pay | Admitting: Emergency Medicine

## 2018-01-17 ENCOUNTER — Emergency Department
Admission: EM | Admit: 2018-01-17 | Discharge: 2018-01-18 | Disposition: A | Discharge status: Other Acute Inpatient Hospital | Payer: Medicaid Other | Attending: Student in an Organized Health Care Education/Training Program | Admitting: Student in an Organized Health Care Education/Training Program

## 2018-01-17 DIAGNOSIS — Z91040 Latex allergy status: Secondary | ICD-10-CM | POA: Diagnosis not present

## 2018-01-17 DIAGNOSIS — R45851 Suicidal ideations: Secondary | ICD-10-CM | POA: Diagnosis not present

## 2018-01-17 DIAGNOSIS — F17200 Nicotine dependence, unspecified, uncomplicated: Secondary | ICD-10-CM | POA: Diagnosis not present

## 2018-01-17 DIAGNOSIS — F329 Major depressive disorder, single episode, unspecified: Secondary | ICD-10-CM | POA: Diagnosis not present

## 2018-01-17 DIAGNOSIS — R51 Headache: Secondary | ICD-10-CM | POA: Diagnosis not present

## 2018-01-17 DIAGNOSIS — F4310 Post-traumatic stress disorder, unspecified: Secondary | ICD-10-CM | POA: Diagnosis present

## 2018-01-17 DIAGNOSIS — Z046 Encounter for general psychiatric examination, requested by authority: Secondary | ICD-10-CM | POA: Diagnosis not present

## 2018-01-17 DIAGNOSIS — Z96649 Presence of unspecified artificial hip joint: Secondary | ICD-10-CM | POA: Insufficient documentation

## 2018-01-17 DIAGNOSIS — F1220 Cannabis dependence, uncomplicated: Secondary | ICD-10-CM | POA: Diagnosis present

## 2018-01-17 DIAGNOSIS — Z79899 Other long term (current) drug therapy: Secondary | ICD-10-CM | POA: Insufficient documentation

## 2018-01-17 DIAGNOSIS — F32A Depression, unspecified: Secondary | ICD-10-CM | POA: Diagnosis present

## 2018-01-17 DIAGNOSIS — F419 Anxiety disorder, unspecified: Secondary | ICD-10-CM | POA: Diagnosis present

## 2018-01-17 DIAGNOSIS — F603 Borderline personality disorder: Secondary | ICD-10-CM | POA: Diagnosis present

## 2018-01-17 LAB — URINE DRUG SCREEN, QUALITATIVE (ARMC ONLY)
AMPHETAMINES, UR SCREEN: NOT DETECTED
Barbiturates, Ur Screen: NOT DETECTED
Benzodiazepine, Ur Scrn: POSITIVE — AB
Cannabinoid 50 Ng, Ur ~~LOC~~: POSITIVE — AB
Cocaine Metabolite,Ur ~~LOC~~: NOT DETECTED
MDMA (Ecstasy)Ur Screen: NOT DETECTED
METHADONE SCREEN, URINE: NOT DETECTED
Opiate, Ur Screen: NOT DETECTED
PHENCYCLIDINE (PCP) UR S: NOT DETECTED
Tricyclic, Ur Screen: POSITIVE — AB

## 2018-01-17 LAB — COMPREHENSIVE METABOLIC PANEL
ALT: 26 U/L (ref 0–44)
AST: 28 U/L (ref 15–41)
Albumin: 3.7 g/dL (ref 3.5–5.0)
Alkaline Phosphatase: 75 U/L (ref 38–126)
Anion gap: 6 (ref 5–15)
BUN: 11 mg/dL (ref 6–20)
CALCIUM: 9 mg/dL (ref 8.9–10.3)
CO2: 26 mmol/L (ref 22–32)
CREATININE: 0.89 mg/dL (ref 0.44–1.00)
Chloride: 106 mmol/L (ref 98–111)
Glucose, Bld: 132 mg/dL — ABNORMAL HIGH (ref 70–99)
Potassium: 3.8 mmol/L (ref 3.5–5.1)
Sodium: 138 mmol/L (ref 135–145)
TOTAL PROTEIN: 7 g/dL (ref 6.5–8.1)
Total Bilirubin: 0.3 mg/dL (ref 0.3–1.2)

## 2018-01-17 LAB — CBC
HCT: 36.6 % (ref 35.0–47.0)
Hemoglobin: 12.4 g/dL (ref 12.0–16.0)
MCH: 30.2 pg (ref 26.0–34.0)
MCHC: 33.8 g/dL (ref 32.0–36.0)
MCV: 89.4 fL (ref 80.0–100.0)
PLATELETS: 290 10*3/uL (ref 150–440)
RBC: 4.09 MIL/uL (ref 3.80–5.20)
RDW: 13.3 % (ref 11.5–14.5)
WBC: 7.2 10*3/uL (ref 3.6–11.0)

## 2018-01-17 LAB — ETHANOL

## 2018-01-17 LAB — POCT PREGNANCY, URINE: PREG TEST UR: NEGATIVE

## 2018-01-17 MED ORDER — LORAZEPAM 1 MG PO TABS: 1.0000 mg | ORAL_TABLET | Freq: Four times a day (QID) | ORAL | Status: DC | PRN

## 2018-01-17 MED ORDER — LAMOTRIGINE 100 MG PO TABS
200.0000 mg | ORAL_TABLET | Freq: Every day | ORAL | Status: DC
  Filled 2018-01-17: qty 2

## 2018-01-17 MED ORDER — BUTALBITAL-APAP-CAFFEINE 50-325-40 MG PO TABS
ORAL_TABLET | ORAL | Status: AC
  Administered 2018-01-17: 1 via ORAL
  Filled 2018-01-17: qty 1

## 2018-01-17 MED ORDER — BUTALBITAL-APAP-CAFFEINE 50-325-40 MG PO TABS
1.0000 | ORAL_TABLET | Freq: Once | ORAL | Status: AC
  Administered 2018-01-17: 1 via ORAL

## 2018-01-17 MED ORDER — PROCHLORPERAZINE MALEATE 10 MG PO TABS
10.0000 mg | ORAL_TABLET | Freq: Four times a day (QID) | ORAL | Status: DC | PRN
  Administered 2018-01-18: 10 mg via ORAL
  Filled 2018-01-17 (×2): qty 1

## 2018-01-17 MED ORDER — QUETIAPINE FUMARATE 25 MG PO TABS
100.0000 mg | ORAL_TABLET | Freq: Two times a day (BID) | ORAL | Status: DC
  Administered 2018-01-17 – 2018-01-18 (×2): 100 mg via ORAL
  Filled 2018-01-17 (×2): qty 4
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
## 2018-01-17 NOTE — ED Notes (Signed)
Placed pts hair tie in belongings bag with her other belongings. Pt does have a ring on which cannot be taken off. Susy Frizzle, RN made aware.
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
## 2018-01-18 ENCOUNTER — Other Ambulatory Visit: Payer: Self-pay

## 2018-01-18 ENCOUNTER — Inpatient Hospital Stay
Admission: AD | Admit: 2018-01-18 | Discharge: 2018-01-22 | DRG: 885 | Disposition: A | Discharge status: Home / Self Care | Payer: Medicaid Other | Source: Intra-hospital | Attending: Psychiatry | Admitting: Psychiatry

## 2018-01-18 DIAGNOSIS — F4310 Post-traumatic stress disorder, unspecified: Secondary | ICD-10-CM | POA: Diagnosis present

## 2018-01-18 DIAGNOSIS — Z882 Allergy status to sulfonamides status: Secondary | ICD-10-CM | POA: Diagnosis not present

## 2018-01-18 DIAGNOSIS — Z888 Allergy status to other drugs, medicaments and biological substances status: Secondary | ICD-10-CM | POA: Diagnosis not present

## 2018-01-18 DIAGNOSIS — Z90710 Acquired absence of both cervix and uterus: Secondary | ICD-10-CM | POA: Diagnosis not present

## 2018-01-18 DIAGNOSIS — Z62810 Personal history of physical and sexual abuse in childhood: Secondary | ICD-10-CM | POA: Diagnosis present

## 2018-01-18 DIAGNOSIS — Z811 Family history of alcohol abuse and dependence: Secondary | ICD-10-CM | POA: Diagnosis not present

## 2018-01-18 DIAGNOSIS — F1220 Cannabis dependence, uncomplicated: Secondary | ICD-10-CM | POA: Diagnosis present

## 2018-01-18 DIAGNOSIS — Z881 Allergy status to other antibiotic agents status: Secondary | ICD-10-CM | POA: Diagnosis not present

## 2018-01-18 DIAGNOSIS — F410 Panic disorder [episodic paroxysmal anxiety] without agoraphobia: Secondary | ICD-10-CM | POA: Diagnosis present

## 2018-01-18 DIAGNOSIS — F515 Nightmare disorder: Secondary | ICD-10-CM | POA: Diagnosis present

## 2018-01-18 DIAGNOSIS — E785 Hyperlipidemia, unspecified: Secondary | ICD-10-CM | POA: Diagnosis not present

## 2018-01-18 DIAGNOSIS — R45851 Suicidal ideations: Secondary | ICD-10-CM

## 2018-01-18 DIAGNOSIS — F3181 Bipolar II disorder: Secondary | ICD-10-CM | POA: Diagnosis present

## 2018-01-18 DIAGNOSIS — F17200 Nicotine dependence, unspecified, uncomplicated: Secondary | ICD-10-CM | POA: Diagnosis present

## 2018-01-18 DIAGNOSIS — F419 Anxiety disorder, unspecified: Secondary | ICD-10-CM | POA: Diagnosis not present

## 2018-01-18 DIAGNOSIS — F429 Obsessive-compulsive disorder, unspecified: Secondary | ICD-10-CM | POA: Diagnosis present

## 2018-01-18 DIAGNOSIS — F4010 Social phobia, unspecified: Secondary | ICD-10-CM | POA: Diagnosis present

## 2018-01-18 DIAGNOSIS — G4700 Insomnia, unspecified: Secondary | ICD-10-CM | POA: Diagnosis present

## 2018-01-18 DIAGNOSIS — Z915 Personal history of self-harm: Secondary | ICD-10-CM | POA: Diagnosis not present

## 2018-01-18 DIAGNOSIS — Z90722 Acquired absence of ovaries, bilateral: Secondary | ICD-10-CM | POA: Diagnosis not present

## 2018-01-18 DIAGNOSIS — Z818 Family history of other mental and behavioral disorders: Secondary | ICD-10-CM

## 2018-01-18 DIAGNOSIS — F603 Borderline personality disorder: Secondary | ICD-10-CM | POA: Diagnosis present

## 2018-01-18 DIAGNOSIS — Z79899 Other long term (current) drug therapy: Secondary | ICD-10-CM | POA: Diagnosis not present

## 2018-01-18 DIAGNOSIS — Z91040 Latex allergy status: Secondary | ICD-10-CM | POA: Diagnosis not present

## 2018-01-18 DIAGNOSIS — F17210 Nicotine dependence, cigarettes, uncomplicated: Secondary | ICD-10-CM | POA: Diagnosis present

## 2018-01-18 DIAGNOSIS — Z8659 Personal history of other mental and behavioral disorders: Secondary | ICD-10-CM | POA: Diagnosis not present

## 2018-01-18 DIAGNOSIS — F1210 Cannabis abuse, uncomplicated: Secondary | ICD-10-CM | POA: Diagnosis not present

## 2018-01-18 DIAGNOSIS — F329 Major depressive disorder, single episode, unspecified: Secondary | ICD-10-CM | POA: Diagnosis not present

## 2018-01-18 MED ORDER — MAGNESIUM HYDROXIDE 400 MG/5ML PO SUSP: 30.0000 mL | Freq: Every day | ORAL | Status: DC | PRN

## 2018-01-18 MED ORDER — GABAPENTIN 100 MG PO CAPS
200.0000 mg | ORAL_CAPSULE | Freq: Three times a day (TID) | ORAL | Status: DC
  Administered 2018-01-18: 200 mg via ORAL
  Filled 2018-01-18 (×2): qty 2

## 2018-01-18 MED ORDER — QUETIAPINE FUMARATE 300 MG PO TABS: 600.0000 mg | ORAL_TABLET | Freq: Every day | ORAL | Status: DC

## 2018-01-18 MED ORDER — ACETAMINOPHEN 325 MG PO TABS
650.0000 mg | ORAL_TABLET | Freq: Four times a day (QID) | ORAL | Status: DC | PRN
  Administered 2018-01-18 – 2018-01-22 (×7): 650 mg via ORAL
  Filled 2018-01-18 (×7): qty 2

## 2018-01-18 MED ORDER — LORAZEPAM 1 MG PO TABS: 1.0000 mg | ORAL_TABLET | Freq: Four times a day (QID) | ORAL | Status: DC | PRN

## 2018-01-18 MED ORDER — TRAZODONE HCL 100 MG PO TABS: 100.0000 mg | ORAL_TABLET | Freq: Every evening | ORAL | Status: DC | PRN

## 2018-01-18 MED ORDER — LURASIDONE HCL 40 MG PO TABS: 20.0000 mg | ORAL_TABLET | Freq: Every day | ORAL | Status: DC

## 2018-01-18 MED ORDER — CLONAZEPAM 0.5 MG PO TABS
0.5000 mg | ORAL_TABLET | Freq: Three times a day (TID) | ORAL | Status: DC | PRN
  Administered 2018-01-18: 0.5 mg via ORAL
  Filled 2018-01-18: qty 1

## 2018-01-18 MED ORDER — LURASIDONE HCL 40 MG PO TABS
20.0000 mg | ORAL_TABLET | Freq: Every day | ORAL | Status: DC
  Filled 2018-01-18: qty 1

## 2018-01-18 MED ORDER — FLUVOXAMINE MALEATE 50 MG PO TABS
50.0000 mg | ORAL_TABLET | Freq: Every day | ORAL | Status: DC
  Administered 2018-01-18: 50 mg via ORAL
  Filled 2018-01-18: qty 1

## 2018-01-18 MED ORDER — CLONAZEPAM 0.5 MG PO TABS
0.5000 mg | ORAL_TABLET | Freq: Three times a day (TID) | ORAL | Status: DC | PRN
  Administered 2018-01-19 – 2018-01-22 (×7): 0.5 mg via ORAL
  Filled 2018-01-18 (×8): qty 1

## 2018-01-18 MED ORDER — QUETIAPINE FUMARATE 200 MG PO TABS: 600.0000 mg | ORAL_TABLET | Freq: Every day | ORAL | Status: DC

## 2018-01-18 MED ORDER — PROCHLORPERAZINE MALEATE 10 MG PO TABS
10.0000 mg | ORAL_TABLET | Freq: Four times a day (QID) | ORAL | Status: DC | PRN
  Filled 2018-01-18: qty 1

## 2018-01-18 MED ORDER — LAMOTRIGINE 100 MG PO TABS: 200.0000 mg | ORAL_TABLET | Freq: Every day | ORAL | Status: DC

## 2018-01-18 MED ORDER — HYDROXYZINE HCL 25 MG PO TABS: 25.0000 mg | ORAL_TABLET | Freq: Three times a day (TID) | ORAL | Status: DC | PRN

## 2018-01-18 MED ORDER — NICOTINE 21 MG/24HR TD PT24
21.0000 mg | MEDICATED_PATCH | Freq: Every day | TRANSDERMAL | Status: DC
  Administered 2018-01-19 – 2018-01-22 (×4): 21 mg via TRANSDERMAL
  Filled 2018-01-18 (×4): qty 1

## 2018-01-18 MED ORDER — QUETIAPINE FUMARATE 200 MG PO TABS
400.0000 mg | ORAL_TABLET | Freq: Every day | ORAL | Status: DC
  Administered 2018-01-18 – 2018-01-21 (×4): 400 mg via ORAL
  Filled 2018-01-18 (×4): qty 2

## 2018-01-18 MED ORDER — CHLORPROMAZINE HCL 100 MG PO TABS
50.0000 mg | ORAL_TABLET | Freq: Three times a day (TID) | ORAL | Status: DC
  Administered 2018-01-18 – 2018-01-19 (×3): 50 mg via ORAL
  Filled 2018-01-18 (×3): qty 1

## 2018-01-18 MED ORDER — ALUM & MAG HYDROXIDE-SIMETH 200-200-20 MG/5ML PO SUSP
30.0000 mL | ORAL | Status: DC | PRN
  Administered 2018-01-21: 30 mL via ORAL
  Filled 2018-01-18: qty 30
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
## 2018-01-18 NOTE — BH Assessment (Signed)
[facility] by Dr. Toni Amendlapacs.  Attending Physician will be Dr. Jennet MaduroPucilowska.   Patient [facility] Lillette BoxerGwen F.   ER staff is aware of the admission:  Davy PiqueLuan, ER Sectary   Dr. Shaune PollackLord, ER MD   Lucilla LameLuara, Patient's Nurse   Ethelene BrownsAnthony, Patient Access.

## 2018-01-18 NOTE — [facility] Group Notes (Signed)
LCSW Group Therapy Note  01/18/2018 1:00 pm  Type of Therapy/Topic:  Group Therapy:  Balance in Life  Participation Level:  Did Not Attend  Description of Group:    This group will address the concept of balance and how it feels and looks when one is unbalanced. Patients will be encouraged to process areas in their lives that are out of balance and identify reasons for remaining unbalanced. Facilitators will guide patients in utilizing problem-solving interventions to address and correct the stressor making their life unbalanced. Understanding and applying boundaries will be explored and addressed for obtaining and maintaining a balanced life. Patients will be encouraged to explore ways to assertively make their unbalanced needs known to significant others in their lives, using other group members and facilitator for support and feedback.  Therapeutic Goals: 1. Patient will identify two or more emotions or situations they have that consume much of in their lives. 2. Patient will identify signs/triggers that life has become out of balance:  3. Patient will identify two ways to set boundaries in order to achieve balance in their lives:  4. Patient will demonstrate ability to communicate their needs through discussion and/or role plays  Summary of Patient Progress:      Therapeutic Modalities:   Cognitive Behavioral Therapy Solution-Focused Therapy Assertiveness Training  Alease FrameSonya S Kamren Heskett, LCSW 01/18/2018 2:20 PM

## 2018-01-18 NOTE — Tx Team (Signed)
Initial Treatment Plan 01/18/2018 3:46 PM Tammy KaufmanKelly L Crager [id number]    PATIENT STRESSORS: Financial difficulties Medication change or noncompliance   PATIENT STRENGTHS: Ability for insight Active sense of humor Average or above average intelligence Communication skills Supportive family/friends   PATIENT IDENTIFIED PROBLEMS: Depression 01/18/18  Suicidal 01/18/18  PTSD 01/18/18                 DISCHARGE CRITERIA:  Ability to meet basic life and health needs Improved stabilization in mood, thinking, and/or behavior  PRELIMINARY DISCHARGE PLAN: Outpatient therapy Return to previous living arrangement    PATIENT/FAMILY INVOLVEMENT: This treatment plan has been presented to and reviewed with the patient, Tammy Robles, and/or family member,.  The patient and family have been given the opportunity to ask questions and make suggestions.  Crist InfanteGwen A Rahsaan Weakland, RN 01/18/2018, 3:46 PM
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
## 2018-01-18 NOTE — Plan of Care (Addendum)
Patient is in day room upon my arrival. Patient is visible and social this evening. Patient is pleasant and cooperative throughout the evening. Mood remains depressed and affect is congruent. Continues to report passive SI with no plan or intent. Verbally contracts for safety while on the unit. Denies HI/AVH.  Reports right hip pain 6/10, given Tylenol with positive results. Fall precautions reinforced. Utilizing walker for ambulation. Reports some relief with walker support. Eating and voiding adequately. Compliant with HS medications and staff direction. Q 15 minute checks maintained. Will continue to monitor throughout the shift. Patient slept 6.75 hours. No apparent distress. Will endorse care to oncoming shift.  Problem: Education: Goal: Knowledge of [place] [facility] Education information/materials will improve Outcome: Progressing Goal: Emotional status will improve Outcome: Progressing Goal: Mental status will improve Outcome: Progressing Goal: Verbalization of understanding the information provided will improve Outcome: Progressing   Problem: Coping: Goal: Coping ability will improve Outcome: Progressing Goal: Will verbalize feelings Outcome: Progressing   Problem: Education: Goal: Knowledge of General Education information will improve Description Including pain rating scale, medication(s)/side effects and non-pharmacologic comfort measures Outcome: Progressing

## 2018-01-18 NOTE — ED Notes (Signed)
Patient talking with Dr.Clapaas at this time. 

## 2018-01-18 NOTE — BHH Suicide Risk Assessment (Signed)
[facility] Admission Suicide Risk Assessment   Nursing information obtained from:  Patient Demographic factors:  Caucasian, Unemployed, Low socioeconomic status Current Mental Status:  Suicidal ideation indicated by patient Loss Factors:  Legal issues, Loss of significant relationship, Financial problems / change in socioeconomic status Historical Factors:  Prior suicide attempts, Family history of suicide, Family history of mental illness or substance abuse Risk Reduction Factors:  Positive coping skills or problem solving skills  Total Time spent with patient: 1 hour Principal Problem: Bipolar 2 disorder (HCC) Diagnosis:   Patient Active Problem List   Diagnosis Date Noted  . Bipolar 2 disorder (HCC) [F31.81] 12/20/2016    Priority: High  . Suicidal ideation [R45.851] 01/18/2018  . Overweight [E66.3] 01/02/2017  . Tobacco use disorder [F17.200] 12/23/2016  . Cannabis use disorder, moderate, dependence (HCC) [F12.20] 12/20/2016  . Migraine headache [G43.909] 12/20/2016  . Borderline personality disorder (HCC) [F60.3] 11/04/2014  . Anxiety and depression [F41.9, F32.9] 03/23/2012  . PTSD (post-traumatic stress disorder) [F43.10] 03/29/2004   Subjective Data: suicidal ideation  Continued Clinical Symptoms:  Alcohol Use Disorder Identification Test Final Score (AUDIT): 0 The "Alcohol Use Disorders Identification Test", Guidelines for Use in Primary Care, Second Edition.  World Science writer [facility]). Score between 0-7:  no or low risk or alcohol related problems. Score between 8-15:  moderate risk of alcohol related problems. Score between 16-19:  high risk of alcohol related problems. Score 20 or above:  warrants further diagnostic evaluation for alcohol dependence and treatment.   CLINICAL FACTORS:   Severe Anxiety and/or Agitation Panic Attacks Bipolar Disorder:   Bipolar II Depression:   Comorbid alcohol abuse/dependence Hopelessness Impulsivity Insomnia Alcohol/Substance  Abuse/Dependencies Obsessive-Compulsive Disorder   Musculoskeletal: Strength & Muscle Tone: within normal limits Gait & Station: normal Patient leans: N/A  Psychiatric Specialty Exam: Physical Exam  Nursing note and vitals reviewed. Psychiatric: Her speech is normal. Her affect is blunt. She is slowed and withdrawn. Cognition and memory are normal. She expresses impulsivity. She exhibits a depressed mood. She expresses suicidal ideation. She expresses suicidal plans.    Review of Systems  Neurological: Negative.   Psychiatric/Behavioral: Positive for depression, substance abuse and suicidal ideas. The patient is nervous/anxious and has insomnia.   All other systems reviewed and are negative.   Blood pressure (!) 94/52, pulse 77, temperature 97.9 F (36.6 C), resp. rate 18, height 5\' 7"  (1.702 m), weight 128.6 kg, SpO2 94 %.Body mass index is 44.4 kg/m.  General Appearance: Casual  Eye Contact:  Good  Speech:  Clear and Coherent  Volume:  Decreased  Mood:  Anxious and Depressed  Affect:  Depressed and Flat  Thought Process:  Goal Directed and Descriptions of Associations: Intact  Orientation:  Full (Time, Place, and Person)  Thought Content:  WDL  Suicidal Thoughts:  Yes.  with intent/plan  Homicidal Thoughts:  No  Memory:  Immediate;   Fair Recent;   Fair Remote;   Fair  Judgement:  Fair  Insight:  Present  Psychomotor Activity:  Psychomotor Retardation  Concentration:  Concentration: Fair and Attention Span: Fair  Recall:  [employer] of Knowledge:  Fair  Language:  Fair  Akathisia:  No  Handed:  Right  AIMS (if indicated):     Assets:  Communication Skills Desire for Improvement Financial Resources/Insurance Housing Resilience Social Support  ADL's:  Intact  Cognition:  WNL  Sleep:  Number of Hours: 6.75      COGNITIVE FEATURES THAT CONTRIBUTE TO RISK:  None  SUICIDE RISK:   Severe:  Frequent, intense, and enduring suicidal ideation, specific plan, no  subjective intent, but some objective markers of intent (i.e., choice of lethal method), the method is accessible, some limited preparatory behavior, evidence of impaired self-control, severe dysphoria/symptomatology, multiple risk factors present, and few if any protective factors, particularly a lack of social support.  PLAN OF CARE: hospital admission, medication management, substance abus counseling, discharge planning.  Tammy Robles is a 47 year old female with a istory of depression, anxiety and mood instability admitted for suicidal ideation with a plan to overdose in the context of major loss and mounting health problems.  #Suicidal ideation -patient able to contract for safety in the hospital  #Mood and anxiety -continue Seroquel 400 mg nightly -start Luvox 50 mg nightly -start Thorazine 50 mg TID  #Smoking cessation -nicotine patch is available  #Disposition -patient will return to home -follow up with [facility]      I certify that inpatient services furnished can reasonably be expected to improve the patient's condition.   Tammy LineaJolanta Leeasia Secrist, MD 01/19/2018, 9:02 AM
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
## 2018-01-18 NOTE — Plan of Care (Signed)
New admission  [facility] . Verbalize  understanding  Problem: Education: Goal: Knowledge of [facility] Education information/materials will improve Outcome: Not Progressing

## 2018-01-18 NOTE — ED Notes (Signed)
Pt. Completed SOC. 
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
## 2018-01-18 NOTE — ED Provider Notes (Addendum)
Vitals:   01/17/18 1949  BP: 100/60  Pulse: 95  Resp: 18  Temp: 98 F (36.7 C)  SpO2: 99%    No acute events reported overnight.  Patient is on involuntary commitment for suicidal ideation.  Looks like tele-psychiatry consultation was placed last night, awaiting report.   Governor RooksLord, Page Lancon, MD 01/18/18 [phone]   Addended to include, SOC report recommends psychiatric admission.  Reviewed recommendations Seroquel 600 mg at bedtime, Neurontin 200 mg 3 times daily, Latuda 20 mg daily and Klonopin 0.5 mg p.o. twice daily as needed anxiety.   I reviewed gabapentin listed as an allergy with nausea and vomiting.  Will place psychiatry consult here for review of medications and essentially another option.  I made the other medication changes in the computer.  Governor RooksLord, Tomara Youngberg, MD 01/18/18 1014    Governor RooksLord, Jovany Disano, MD 01/18/18 1017

## 2018-01-18 NOTE — ED Notes (Signed)
SOC report given.  [facility]  Machine set and ready.

## 2018-01-19 DIAGNOSIS — F3181 Bipolar II disorder: Principal | ICD-10-CM

## 2018-01-19 LAB — T4, FREE: Free T4: 0.83 ng/dL (ref 0.82–1.77)

## 2018-01-19 LAB — TSH: TSH: 7.659 u[IU]/mL — ABNORMAL HIGH (ref 0.350–4.500)

## 2018-01-19 LAB — LIPID PANEL
Cholesterol: 214 mg/dL — ABNORMAL HIGH (ref 0–200)
HDL: 54 mg/dL (ref >40)
LDL CALC: UNDETERMINED mg/dL (ref 0–99)
TRIGLYCERIDES: 408 mg/dL — AB (ref <150)
Total CHOL/HDL Ratio: 4 RATIO
VLDL: UNDETERMINED mg/dL (ref 0–40)

## 2018-01-19 LAB — HEMOGLOBIN A1C
Hgb A1c MFr Bld: 5.7 % — ABNORMAL HIGH (ref 4.8–5.6)
Mean Plasma Glucose: 116.89 mg/dL

## 2018-01-19 MED ORDER — FLUVOXAMINE MALEATE 50 MG PO TABS
100.0000 mg | ORAL_TABLET | Freq: Every day | ORAL | Status: DC
  Administered 2018-01-19 – 2018-01-21 (×3): 100 mg via ORAL
  Filled 2018-01-19 (×3): qty 2

## 2018-01-19 MED ORDER — GEMFIBROZIL 600 MG PO TABS
600.0000 mg | ORAL_TABLET | Freq: Two times a day (BID) | ORAL | Status: DC
  Administered 2018-01-19 – 2018-01-22 (×6): 600 mg via ORAL
  Filled 2018-01-19 (×8): qty 1

## 2018-01-19 MED ORDER — CHLORPROMAZINE HCL 25 MG PO TABS
25.0000 mg | ORAL_TABLET | Freq: Three times a day (TID) | ORAL | Status: DC
  Administered 2018-01-19 – 2018-01-22 (×9): 25 mg via ORAL
  Filled 2018-01-19 (×11): qty 1

## 2018-01-19 NOTE — Tx Team (Addendum)
Interdisciplinary Treatment and Diagnostic Plan Update  01/19/2018 Time of Session: 10:50 AM Tammy Robles MRN: [id number]  Principal Diagnosis: Bipolar 2 disorder (HCC)  Secondary Diagnoses: Principal Problem:   Bipolar 2 disorder (HCC) Active Problems:   PTSD (post-traumatic stress disorder)   Cannabis use disorder, moderate, dependence (HCC)   Tobacco use disorder   Suicidal ideation   Current Medications:  Current Facility-Administered Medications  Medication Dose Route Frequency Provider Last Rate Last Dose  . acetaminophen (TYLENOL) tablet 650 mg  650 mg Oral Q6H PRN Clapacs, Jackquline Denmark, MD   650 mg at 01/19/18 1517  . alum & mag hydroxide-simeth (MAALOX/MYLANTA) 200-200-20 MG/5ML suspension 30 mL  30 mL Oral Q4H PRN Clapacs, John T, MD      . chlorproMAZINE (THORAZINE) tablet 25 mg  25 mg Oral TID Pucilowska, Jolanta B, MD      . clonazePAM (KLONOPIN) tablet 0.5 mg  0.5 mg Oral TID PRN Clapacs, Jackquline Denmark, MD   0.5 mg at 01/19/18 1724  . fluvoxaMINE (LUVOX) tablet 100 mg  100 mg Oral QHS Pucilowska, Jolanta B, MD      . gemfibrozil (LOPID) tablet 600 mg  600 mg Oral BID AC Pucilowska, Jolanta B, MD      . magnesium hydroxide (MILK OF MAGNESIA) suspension 30 mL  30 mL Oral Daily PRN Clapacs, John T, MD      . nicotine (NICODERM CQ - dosed in mg/24 hours) patch 21 mg  21 mg Transdermal Q0600 Pucilowska, Jolanta B, MD   21 mg at 01/19/18 0837  . prochlorperazine (COMPAZINE) tablet 10 mg  10 mg Oral Q6H PRN Clapacs, John T, MD      . QUEtiapine (SEROQUEL) tablet 400 mg  400 mg Oral QHS Pucilowska, Jolanta B, MD   400 mg at 01/18/18 2112   PTA Medications: Medications Prior to Admission  Medication Sig Dispense Refill Last Dose  . buPROPion (WELLBUTRIN XL) 150 MG 24 hr tablet Take 300 mg by mouth.     . butalbital-acetaminophen-caffeine (FIORICET, ESGIC) 50-325-40 MG tablet Take 1-2 tablets by mouth every 6 (six) hours as needed for headache. 12 tablet 0   . clonazePAM (KLONOPIN) 0.5 MG  tablet Take 0.5 mg by mouth 3 (three) times daily as needed.      . cyclobenzaprine (FLEXERIL) 10 MG tablet Take 1 tablet (10 mg total) by mouth 3 (three) times daily as needed for muscle spasms. 12 tablet 0   . cyclobenzaprine (FLEXERIL) 5 MG tablet Take 5 mg by mouth.     . diclofenac sodium (VOLTAREN) 1 % GEL Apply topically every 6 (six) hours as needed.      . DULoxetine (CYMBALTA) 30 MG capsule Take 2 capsules (60 mg total) by mouth daily. 30 capsule 0   . estradiol (ESTRACE) 0.1 MG/GM vaginal cream Place vaginally 3 (three) times daily as needed.      Marland Kitchen FLUoxetine (PROZAC) 40 MG capsule Take 80 mg by mouth.     . fluvoxaMINE (LUVOX) 50 MG tablet Take 3 tablets (150 mg total) by mouth at bedtime. 90 tablet 1 12/28/2016 at 2000  . gabapentin (NEURONTIN) 300 MG capsule Take 1 capsule (300 mg total) by mouth 3 (three) times daily. 90 capsule 0   . HYDROcodone-acetaminophen (NORCO/VICODIN) 5-325 MG tablet Take 1-2 tablets by mouth every 4 (four) hours as needed for moderate pain. 15 tablet 0   . hydrOXYzine (ATARAX/VISTARIL) 25 MG tablet Take 1 tablet (25 mg total) by mouth 3 (three) times daily  as needed for anxiety. 90 tablet 1 PRN at PRN  . ibuprofen (ADVIL,MOTRIN) 600 MG tablet Take 1 tablet by mouth three times daily with meals 42 tablet 1   . lamoTRIgine (LAMICTAL) 200 MG tablet Take 1 tablet (200 mg total) by mouth at bedtime. 30 tablet 1 12/28/2016 at 2000  . LORazepam (ATIVAN) 1 MG tablet Take 1 tablet (1 mg total) by mouth every 6 (six) hours as needed for anxiety. 6 tablet 0   . nitrofurantoin, macrocrystal-monohydrate, (MACROBID) 100 MG capsule Take 1 capsule (100 mg total) by mouth 2 (two) times daily for 7 days. 14 capsule 0   . ondansetron (ZOFRAN ODT) 4 MG disintegrating tablet Allow 1-2 tablets to dissolve in your mouth every 8 hours as needed for nausea/vomiting 30 tablet 0   . ondansetron (ZOFRAN) 4 MG tablet Take 1 tablet (4 mg total) by mouth daily as needed. 30 tablet 0   .  oxyCODONE-acetaminophen (ROXICET) 5-325 MG tablet Take 1 tablet by mouth every 4 (four) hours as needed for severe pain. 6 tablet 0   . phenol (CHLORASEPTIC) 1.4 % LIQD 2 sprays by Mucous Membrane route every two (2) hours as needed.     . prazosin (MINIPRESS) 2 MG capsule Take 1 capsule (2 mg total) by mouth daily with breakfast. 30 capsule 1 12/29/2016 at 0800  . prochlorperazine (COMPAZINE) 10 MG tablet Take 1 tablet (10 mg total) by mouth every 6 (six) hours as needed for nausea or vomiting. 90 tablet 1 PRN at PRN  . QUEtiapine (SEROQUEL) 100 MG tablet Take 1 tablet (100 mg total) by mouth 2 (two) times daily. Take 100mg  in the morning and 200mg  at bedtime 50 tablet 0     Patient Stressors: Financial difficulties Medication change or noncompliance  Patient Strengths: Ability for insight Active sense of humor Average or above average intelligence Communication skills Supportive family/friends  Treatment Modalities: Medication Management, Group therapy, Case management,  1 to 1 session with clinician, Psychoeducation, Recreational therapy.   Physician Treatment Plan for Primary Diagnosis: Bipolar 2 disorder (HCC) Long Term Goal(s): Improvement in symptoms so as ready for discharge Improvement in symptoms so as ready for discharge   Short Term Goals: Ability to identify changes in lifestyle to reduce recurrence of condition will improve Ability to verbalize feelings will improve Ability to disclose and discuss suicidal ideas Ability to demonstrate self-control will improve Ability to identify and develop effective coping behaviors will improve Ability to maintain clinical measurements within normal limits will improve Ability to identify triggers associated with substance abuse/mental health issues will improve Ability to identify changes in lifestyle to reduce recurrence of condition will improve Ability to demonstrate self-control will improve Ability to identify triggers  associated with substance abuse/mental health issues will improve  Medication Management: Evaluate patient's response, side effects, and tolerance of medication regimen.  Therapeutic Interventions: 1 to 1 sessions, Unit Group sessions and Medication administration.  Evaluation of Outcomes: Progressing  Physician Treatment Plan for Secondary Diagnosis: Principal Problem:   Bipolar 2 disorder (HCC) Active Problems:   PTSD (post-traumatic stress disorder)   Cannabis use disorder, moderate, dependence (HCC)   Tobacco use disorder   Suicidal ideation  Long Term Goal(s): Improvement in symptoms so as ready for discharge Improvement in symptoms so as ready for discharge   Short Term Goals: Ability to identify changes in lifestyle to reduce recurrence of condition will improve Ability to verbalize feelings will improve Ability to disclose and discuss suicidal ideas Ability to demonstrate self-control  will improve Ability to identify and develop effective coping behaviors will improve Ability to maintain clinical measurements within normal limits will improve Ability to identify triggers associated with substance abuse/mental health issues will improve Ability to identify changes in lifestyle to reduce recurrence of condition will improve Ability to demonstrate self-control will improve Ability to identify triggers associated with substance abuse/mental health issues will improve     Medication Management: Evaluate patient's response, side effects, and tolerance of medication regimen.  Therapeutic Interventions: 1 to 1 sessions, Unit Group sessions and Medication administration.  Evaluation of Outcomes: Progressing   RN Treatment Plan for Primary Diagnosis: Bipolar 2 disorder (HCC) Long Term Goal(s): Knowledge of disease and therapeutic regimen to maintain health will improve  Short Term Goals: Ability to remain free from injury will improve, Ability to disclose and discuss suicidal  ideas, Ability to identify and develop effective coping behaviors will improve and Compliance with prescribed medications will improve  Medication Management: RN will administer medications as ordered by provider, will assess and evaluate patient's response and provide education to patient for prescribed medication. RN will report any adverse and/or side effects to prescribing provider.  Therapeutic Interventions: 1 on 1 counseling sessions, Psychoeducation, Medication administration, Evaluate responses to treatment, Monitor vital signs and CBGs as ordered, Perform/monitor CIWA, COWS, AIMS and Fall Risk screenings as ordered, Perform wound care treatments as ordered.  Evaluation of Outcomes: Progressing   LCSW Treatment Plan for Primary Diagnosis: Bipolar 2 disorder (HCC) Long Term Goal(s): Safe transition to appropriate next level of care at discharge, Engage patient in therapeutic group addressing interpersonal concerns.  Short Term Goals: Engage patient in aftercare planning with referrals and resources and Increase skills for wellness and recovery  Therapeutic Interventions: Assess for all discharge needs, 1 to 1 time with Social worker, Explore available resources and support systems, Assess for adequacy in community support network, Educate family and significant other(s) on suicide prevention, Complete Psychosocial Assessment, Interpersonal group therapy.  Evaluation of Outcomes: Progressing   Progress in Treatment: Attending groups: No. Participating in groups: No. Taking medication as prescribed: Yes. Toleration medication: Yes. Family/Significant other contact made: No, will contact:  Pt has refused family/support involvement Patient understands diagnosis: Yes. Discussing patient identified problems/goals with staff: Yes. Medical problems stabilized or resolved: Yes. Denies suicidal/homicidal ideation: Yes. Issues/concerns per patient self-inventory: No. Other: n/a  New  problem(s) identified: No, Describe:  No new problems identified  New Short Term/Long Term Goal(s):  Patient Goals:  "Be at peace, self-sufficient, and mind stop racing"  Discharge Plan or Barriers: Pt will return to her home and resume outpatient services with [facility].  Reason for Continuation of Hospitalization: Anxiety Depression Medication stabilization  Estimated Length of Stay: 3-5 days  Recreational Therapy: Patient Stressors: N/A Patient Goal: Patient will identify 3 triggers for depression within 5 recreation therapy group sessions Attendees: Patient: Tammy SinghKelly Robles 01/19/2018 6:10 PM  Physician: Kristine LineaJolanta Pucilowska, MD 01/19/2018 6:10 PM  Nursing: Milas HockShatara Powell, RN 01/19/2018 6:10 PM  RN Care Manager: 01/19/2018 6:10 PM  Social Worker: Huey RomansSonya Carter, LCSW 01/19/2018 6:10 PM  Recreational Therapist: Garret ReddishShay Tashi Andujo, LRT 01/19/2018 6:10 PM  Other: Johny Shearsassandra Jarrett, LCSWA 01/19/2018 6:10 PM  Other: Damian LeavellJohn Mullins, Chaplain 01/19/2018 6:10 PM  Other: 01/19/2018 6:10 PM    Scribe for Treatment Team: Alease FrameSonya S Carter, LCSW 01/19/2018 6:10 PM

## 2018-01-19 NOTE — [facility] Group Notes (Signed)
[facility] Group Notes:  (Nursing/MHT/Case Management/Adjunct)  Date:  01/19/2018  Time:  11:59 PM  Type of Therapy:  Group Therapy  Participation Level:  Active  Participation Quality:  Appropriate  Affect:  Appropriate  Cognitive:  Appropriate  Insight:  Appropriate  Engagement in Group:  Engaged  Modes of Intervention:  Discussion  Summary of Progress/Problems: Tammy Robles stated her goal was to get use to her medication Tammy Robles stated she did not go to group today Tammy Robles stated she would try harder to attend Tammy Robles completed her self inventory sheet Tammy Robles reported poor appetite and cencentration. Low energy Moderate depression and anxiety No harmful ideation to self or others Reported headaches and right hip pain. Reported sleeping well MHT reviewed the rules and expectations of the unit. 1. Visitation hours and number of visitors 2. Phone hours 3. No food in rooms (explained why food was not permitted in rooms) 4. No sharing clothes or borrowing clothes 5. Explained how snack would be provided 6. No sharing personal information or last names 7. No touching, hugging, doing hair, or going into other patients rooms MHT processed with patients about the importance of attending groups MHT explained groups were designed to help patients learn new coping skills and gain insight into why they were admitted MHT explained learning new coping skills and new things about self would help prepare them for discharge MHT processed with group about after care and the importance of taking medications MHT encouraged patients to talk with providers about their care. MHT addressed concerns and answered questions related to care MHT informed patients to be active in creating their person-centered plan MHT informed group of services available upon discharge, even those who do not have insurance Tammy Robles 01/19/2018, 11:59 PM
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
## 2018-01-19 NOTE — Progress Notes (Signed)
   01/19/18 1030  Clinical Encounter Type  Visited With Patient  Visit Type Initial;Spiritual support;Behavioral Health  Referral From Social work  Consult/Referral To Chaplain  Spiritual Encounters  Spiritual Needs Other (Comment)   CH attended the patient's treatment team meeting. Ms. Tammy Robles lives with an elderly couple and cares for them and they give her a place to live. She states that she was scheduled to have a SSI court dtate but her lawyer canceled it for a later date. She needs knee surgery but her MD. Wants her to lose weight first. I will continue to follow up with the patient.

## 2018-01-19 NOTE — Progress Notes (Signed)
Received Tammy Robles this AM after breakfast, she was medicated per order and returned to her room with her walker. She continued to endorse feeling anxious throughout the day.

## 2018-01-19 NOTE — Progress Notes (Signed)
Recreation Therapy Notes  Date: 01/19/2018  Time: 9:30 am  Location: Craft Room  Behavioral response: Appropriate    Intervention Topic: Leisure  Discussion/Intervention:  Group content today was focused on leisure. The group defined what leisure is and some positive leisure activities they participate in. Individuals identified the difference between good and bad leisure. Participants expressed how they feel after participating in the leisure of their choice. The group discussed how they go about picking a leisure activity and if others are involved in their leisure activities. The patient stated how many leisure activities they too choose from and reasons why it is important to have leisure time. Individuals participated in the intervention "Exploration of Leisure" where they had a chance to identify new leisure activities as well as benefits of leisure.   Clinical Observations/Feedback:  Patient came to group and was and focused on what peers and staff had to say about Leisure. Individual was social with peers and staff while participating in the intervention.  Halen Mossbarger LRT/CTRS          Crosby Bevan 01/19/2018 12:59 PM

## 2018-01-19 NOTE — Progress Notes (Signed)
Recreation Therapy Notes  INPATIENT RECREATION THERAPY ASSESSMENT  Patient Details Name: Tammy Robles MRN: [id number] DOB: October 13, 1970 Today's Date: 01/19/2018       Information Obtained From: Patient  Able to Participate in Assessment/Interview: Yes  Patient Presentation: Responsive  Reason for Admission (Per Patient): Active Symptoms, Suicidal Ideation  Patient Stressors:    Coping Skills:   Music, Read  Leisure Interests (2+):  Individual - TV, Individual - Phone, Music - Listen  Frequency of Recreation/Participation: Monthly  Awareness of Community Resources:  Yes  Community Resources:  [employer], Other (Comment)([facility])  Current Use: No  If no, Barriers?: Transportation, Surveyor, quantityinancial  Expressed Interest in [employer] CorporationCommunity Resource Information: Yes  [place]  Patient Main Form of Transportation: Other (Comment)(The people I live with)  Patient Strengths:   Caring; Empathetic  Patient Identified Areas of Improvement:  Express myself more  Patient Goal for Hospitalization:  Learn to express myself more  Current SI (including self-harm):  No  Current HI:  No  Current AVH: No  Staff Intervention Plan: Group Attendance, Collaborate with Interdisciplinary Treatment Team  Consent to Intern Participation: N/A  Tammy Robles 01/19/2018, 3:32 PM

## 2018-01-19 NOTE — [facility] Group Notes (Signed)
[facility] Group Notes:  (Nursing/MHT/Case Management/Adjunct)  Date:  01/19/2018  Time:  1:20 AM  Type of Therapy:  Group Therapy  Participation Level:  Active  Participation Quality:  Appropriate  Affect:  Appropriate  Cognitive:  Appropriate  Insight:  Appropriate  Engagement in Group:  Engaged  Modes of Intervention:  Discussion  Summary of Progress/Problems: MHT reviewed rules and expectations of the unit. 1. Visitation hours and number of visitors at one time 2. Phone hours 3. Where to go for vitals and the time it would be called for 4. Informed of routine checks and encouraged to cover appropriately as MHT's would be looking in on them at night 5. No sharing clothes or items 6. No touching, hugging, doing other patient's hair 7. No going in other patient's room 8. No walking down halls other than your own 9. No use of last names on unit MHT handed out snack menu and explained how snack would be distributed MHT reminded patients not to take food or drink, other than water to the rooms MHT explained why food was not allowed in rooms MHT was called out to assist with a patient on the unit MHT was unable to review goals with each patient. Tammy NeighborsKeith D Rene Robles 01/19/2018, 1:20 AM

## 2018-01-19 NOTE — [facility] Group Notes (Signed)
9/13/20191PM  Type of Therapy and Topic:  Group Therapy:  Feelings around Relapse and Recovery  Participation Level:  Did Not Attend   Description of Group:    Patients in this group will discuss emotions they experience before and after a relapse. They will process how experiencing these feelings, or avoidance of experiencing them, relates to having a relapse. Facilitator will guide patients to explore emotions they have related to recovery. Patients will be encouraged to process which emotions are more powerful. They will be guided to discuss the emotional reaction significant others in their lives may have to patients' relapse or recovery. Patients will be assisted in exploring ways to respond to the emotions of others without this contributing to a relapse.  Therapeutic Goals: 1. Patient will identify two or more emotions that lead to a relapse for them 2. Patient will identify two emotions that result when they relapse 3. Patient will identify two emotions related to recovery 4. Patient will demonstrate ability to communicate their needs through discussion and/or role plays   Summary of Patient Progress: Patient was encouraged and invited to attend group. Patient did not attend group. Social worker will continue to encourage group participation in the future.      Therapeutic Modalities:   Cognitive Behavioral Therapy Solution-Focused Therapy Assertiveness Training Relapse Prevention Therapy   Johny ShearsCassandra  Lysa Livengood, LCSW 01/19/2018 2:29 PM

## 2018-01-20 DIAGNOSIS — Z811 Family history of alcohol abuse and dependence: Secondary | ICD-10-CM

## 2018-01-20 DIAGNOSIS — Z915 Personal history of self-harm: Secondary | ICD-10-CM

## 2018-01-20 DIAGNOSIS — E785 Hyperlipidemia, unspecified: Secondary | ICD-10-CM

## 2018-01-20 DIAGNOSIS — F429 Obsessive-compulsive disorder, unspecified: Secondary | ICD-10-CM

## 2018-01-20 DIAGNOSIS — F1210 Cannabis abuse, uncomplicated: Secondary | ICD-10-CM

## 2018-01-20 DIAGNOSIS — F419 Anxiety disorder, unspecified: Secondary | ICD-10-CM

## 2018-01-20 DIAGNOSIS — Z818 Family history of other mental and behavioral disorders: Secondary | ICD-10-CM

## 2018-01-20 DIAGNOSIS — F4310 Post-traumatic stress disorder, unspecified: Secondary | ICD-10-CM

## 2018-01-20 DIAGNOSIS — Z8659 Personal history of other mental and behavioral disorders: Secondary | ICD-10-CM

## 2018-01-20 MED ORDER — LOPERAMIDE HCL 2 MG PO CAPS
2.0000 mg | ORAL_CAPSULE | ORAL | Status: DC | PRN
  Administered 2018-01-20: 2 mg via ORAL
  Filled 2018-01-20: qty 1

## 2018-01-20 MED ORDER — BUTALBITAL-APAP-CAFFEINE 50-325-40 MG PO TABS
1.0000 | ORAL_TABLET | Freq: Four times a day (QID) | ORAL | Status: DC | PRN
  Administered 2018-01-20 – 2018-01-21 (×3): 1 via ORAL
  Filled 2018-01-20 (×5): qty 1

## 2018-01-20 MED ORDER — GABAPENTIN 300 MG PO CAPS
300.0000 mg | ORAL_CAPSULE | Freq: Three times a day (TID) | ORAL | Status: DC
  Administered 2018-01-20 – 2018-01-22 (×7): 300 mg via ORAL
  Filled 2018-01-20 (×7): qty 1

## 2018-01-20 NOTE — Progress Notes (Signed)
[facility]  01/20/2018 3:21 PM Tammy KaufmanKelly L Robles  MRN:  [id number] Subjective: Patient concerned about elevated cholesterol and is anxious to see how the medication works for her.  Endorses that she is hoping to see some changes with her psychotropic medications.  Patient spoke with me for quite some time, is able to walk with her walker.  Denies any suicidal thoughts currently.  Feels that her mood is a little better today.  Endorses that she is no longer in crisis mode. Principal Problem: Bipolar 2 disorder (HCC) Diagnosis:   Patient Active Problem List   Diagnosis Date Noted  . Suicidal ideation [R45.851] 01/18/2018  . Overweight [E66.3] 01/02/2017  . Tobacco use disorder [F17.200] 12/23/2016  . Bipolar 2 disorder (HCC) [F31.81] 12/20/2016  . Cannabis use disorder, moderate, dependence (HCC) [F12.20] 12/20/2016  . Migraine headache [G43.909] 12/20/2016  . Borderline personality disorder (HCC) [F60.3] 11/04/2014  . Anxiety and depression [F41.9, F32.9] 03/23/2012  . PTSD (post-traumatic stress disorder) [F43.10] 03/29/2004   Total Time spent with patient: 30 minutes  Past Psychiatric History: Past psychiatric history. She was given diagnoses of bipolar 2, borderline personality, PTSD, and OCD. There was one suicide attempt in her teens. She was admitted 3 or 4 times for suicidal ideation. She was tried on Seroquel, Latuda, Lamictal, Neurontin, Prozac, Wellbutrin, Luvox and Clonazepam.  Past Medical History:  Past Medical History:  Diagnosis Date  . Bladder prolapse, female, acquired   . Endometriosis 1996  . Endometritis 1990   fever 105 post delivery  . Idiopathic intracranial hypertension   . Polycystic ovarian disease 1993    Past Surgical History:  Procedure Laterality Date  . ABDOMINAL HYSTERECTOMY    . 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 HIP ARTHROPLASTY    . hysterrectomy  1996  . LAPAROTOMY     multiple for cysts  .  LEFT OOPHORECTOMY  2007  . mass off appendix    . RIGHT OOPHORECTOMY    . TONSILLECTOMY    . WRIST SURGERY  2007 and 2003   both tendon release   Family History:  Family History  Problem Relation Age of Onset  . Stroke Mother   . Cancer Father    Family Psychiatric  History: Maternal grandfather committed suicide, mother with bipolar, alcoholic father. Social History:  Social History   Substance and Sexual Activity  Alcohol Use No     Social History   Substance and Sexual Activity  Drug Use No    Social History   Socioeconomic History  . Marital status: Single    Spouse name: Not on file  . Number of children: Not on file  . Years of education: Not on file  . Highest education level: Not on file  Occupational History  . Not on file  Social Needs  . Financial resource strain: Not on file  . Food insecurity:    Worry: Not on file    Inability: Not on file  . Transportation needs:    Medical: Not on file    Non-medical: Not on file  Tobacco Use  . Smoking status: Current Some Day Smoker    Packs/day: 0.50  . Smokeless tobacco: Never Used  Substance and Sexual Activity  . Alcohol use: No  . Drug use: No  . Sexual activity: Not on file  Lifestyle  . Physical activity:    Days per week: Not on file    Minutes  per session: Not on file  . Stress: Not on file  Relationships  . Social connections:    Talks on phone: Not on file    Gets together: Not on file    Attends religious service: Not on file    Active member of club or organization: Not on file    Attends meetings of clubs or organizations: Not on file    Relationship status: Not on file  Other Topics Concern  . Not on file  Social History Narrative  . Not on file   Additional Social History:                         Sleep: Good  Appetite:  Good  Current Medications: Current Facility-Administered Medications  Medication Dose Route Frequency Provider Last Rate Last Dose  .  acetaminophen (TYLENOL) tablet 650 mg  650 mg Oral Q6H PRN Clapacs, Jackquline Denmark, MD   650 mg at 01/20/18 1610  . alum & mag hydroxide-simeth (MAALOX/MYLANTA) 200-200-20 MG/5ML suspension 30 mL  30 mL Oral Q4H PRN Clapacs, John T, MD      . butalbital-acetaminophen-caffeine (FIORICET, ESGIC) 50-325-40 MG per tablet 1 tablet  1 tablet Oral Q6H PRN Aundria Rud, MD      . chlorproMAZINE (THORAZINE) tablet 25 mg  25 mg Oral TID Pucilowska, Jolanta B, MD   25 mg at 01/20/18 1115  . clonazePAM (KLONOPIN) tablet 0.5 mg  0.5 mg Oral TID PRN Clapacs, Jackquline Denmark, MD   0.5 mg at 01/20/18 1115  . fluvoxaMINE (LUVOX) tablet 100 mg  100 mg Oral QHS Pucilowska, Jolanta B, MD   100 mg at 01/19/18 2113  . gabapentin (NEURONTIN) capsule 300 mg  300 mg Oral TID Aundria Rud, MD   300 mg at 01/20/18 1515  . gemfibrozil (LOPID) tablet 600 mg  600 mg Oral BID AC Pucilowska, Jolanta B, MD   600 mg at 01/20/18 0904  . magnesium hydroxide (MILK OF MAGNESIA) suspension 30 mL  30 mL Oral Daily PRN Clapacs, John T, MD      . nicotine (NICODERM CQ - dosed in mg/24 hours) patch 21 mg  21 mg Transdermal Q0600 Pucilowska, Jolanta B, MD   21 mg at 01/20/18 1117  . prochlorperazine (COMPAZINE) tablet 10 mg  10 mg Oral Q6H PRN Clapacs, John T, MD      . QUEtiapine (SEROQUEL) tablet 400 mg  400 mg Oral QHS Pucilowska, Jolanta B, MD   400 mg at 01/19/18 2113    Lab Results:  Results for orders placed or performed during the hospital encounter of 01/18/18 (from the past 48 hour(s))  T4, free     Status: None   Collection Time: 01/19/18  5:15 PM  Result Value Ref Range   Free T4 0.83 0.82 - 1.77 ng/dL    Comment: (NOTE) Biotin ingestion may interfere with free T4 tests. If the results are inconsistent with the TSH level, previous test results, or the clinical presentation, then consider biotin interference. If needed, order repeat testing after stopping biotin. Performed at [facility], [address]  [place], [place] [address]     Blood Alcohol level:  Lab Results  Component Value Date   [facility] <10 01/17/2018   ETH <10 10/21/2017    Metabolic Disorder Labs: Lab Results  Component Value Date   HGBA1C 5.7 (H) 01/17/2018   MPG 116.89 01/17/2018   MPG 105.41 12/22/2016   No results found for: PROLACTIN Lab  Results  Component Value Date   CHOL 214 (H) 01/17/2018   TRIG 408 (H) 01/17/2018   HDL 54 01/17/2018   CHOLHDL 4.0 01/17/2018   VLDL UNABLE TO CALCULATE IF TRIGLYCERIDE OVER 400 mg/dL 16/02/9603   LDLCALC UNABLE TO CALCULATE IF TRIGLYCERIDE OVER 400 mg/dL 54/01/8118   LDLCALC 93 12/20/2016    Physical Findings: AIMS:  , ,  ,  ,    CIWA:    COWS:     Musculoskeletal: Strength & Muscle Tone: Decreased in lower extremities, needs a walker for ambulation Gait & Station: Relates with a walker Patient leans: N/A  Psychiatric Specialty Exam: Physical Exam  ROS  Blood pressure 111/74, pulse (!) 104, temperature 97.6 F (36.4 C), temperature source Oral, resp. rate 18, height 5\' 7"  (1.702 m), weight 128.6 kg, SpO2 98 %.Body mass index is 44.4 kg/m.  General Appearance: Neat and Well Groomed  Eye Contact:  Fair  Speech:  Normal Rate  Volume:  Normal  Mood:  Anxious  Affect:  Constricted  Thought Process:  Coherent  Orientation:  Full (Time, Place, and Person)  Thought Content:  Logical  Suicidal Thoughts:  No  Homicidal Thoughts:  No  Memory:  NA  Judgement:  Impaired  Insight:  Fair  Psychomotor Activity:  Normal  Assets:  Communication Skills Desire for Improvement  ADL's:  Impaired  Cognition:  WNL  Sleep:  Number of Hours: 7.5     Treatment Plan Summary: Daily contact with patient to assess and evaluate symptoms and progress in treatment and Medication management Ms. Dressel is a 47 year old female with a istory of depression, anxiety and mood instability admitted for suicidal ideation with a plan to overdose on medications in the context of major loss and  mounting health problems.  #Suicidal ideation -patient able to contract for safety in the hospital  #Mood and anxiety -continue Seroquel 400 mg nightly for mood stabilization -increase Luvox to 100 mg nightly for OCD, PTSD -lower Thorazine to 25 mg TID for "racing thoughts" -Restart gabapentin that she was on as a home medication.  Restarted at 300 mg thrice daily  #Smoking cessation -nicotine patch is available  #Dyslipidemia -start Lopid 600 mg BID  #Elevated TSH -T4 WNL  #Cannabis abuse -minimizes problems and declines   #Labs -lipid panel, TSH and A1C are all elevated -EKG, reviewed, NSR with QTc 435 -pregnancy test is negative  #Disposition -patient will return to home -follow up with Dondra Spry, MD 01/20/2018, 3:21 PM

## 2018-01-20 NOTE — Progress Notes (Signed)
Received Tammy Robles this AM after breakfast, she was compliant with her medications and received fioricet for a migraine headache. She continues to walk with a walker and medicated for hip pain with relief. Later she c/o of diarrhea and received Imodium. She continues to feel anxious and depressed.

## 2018-01-20 NOTE — Plan of Care (Addendum)
Patient found in day room upon my arrival. Patient is visible and social this evening. Patient is pleasant and polite throughout the evening. Attends group. Continues to endorse depression and anxiety. Affect is full range and appropriate. Complains of right hip pain, given Tylenol with positive results. Utilizing walker for ambulation. Continues to report passive SI with no plan or intent, contracts for safety. Denies HI/AVH. Given 1700 medications when they became available @1940 . Eating and voiding adequately. Compliant with HS medications and asked appropriate questions. Compliant with unit routine and staff direction. Q 15 minute checks maintained. Will continue to monitor throughout the shift. Patient slept 7.5 hours. No apparent distress. Woke with HA 8/10, given Tylenol. Reports she takes Imitrex for migraines in the past. Encouraged her to speak with MD if HA does not subside with Tylenol. Verbalized understanding. Will enodrse care to oncoming shift.  Problem: Education: Goal: Knowledge of [facility] Education information/materials will improve Outcome: Progressing Goal: Emotional status will improve Outcome: Progressing Goal: Mental status will improve Outcome: Progressing Goal: Verbalization of understanding the information provided will improve Outcome: Progressing   Problem: Coping: Goal: Coping ability will improve Outcome: Progressing Goal: Will verbalize feelings Outcome: Progressing   Problem: Self-Concept: Goal: Will verbalize positive feelings about self Outcome: Progressing   Problem: Education: Goal: Knowledge of General Education information will improve Description Including pain rating scale, medication(s)/side effects and non-pharmacologic comfort measures Outcome: Progressing

## 2018-01-20 NOTE — [facility] Group Notes (Signed)
LCSW Group Therapy Note  01/20/2018 1:15pm  Type of Therapy and Topic:  Group Therapy:  Healthy Self Image and Positive Change  Participation Level:  Active   Description of Group:  In this group, patients will compare and contrast their current "I am...." statements to the visions they identify as desirable for their lives.  Patients discuss fears and how they can make positive changes in their cognitions that will positively impact their behaviors.  Facilitator played a motivational 3-minute speech and patients were left with the task of thinking about what "I am...." statements they can start using in their lives immediately.  Therapeutic Goals: 1. Patient will state their current self-perception as expressed in an "I Am" statement 2. Patient will contrast this with their desired vision for their live 3. Patient will identify 3 fears that negatively impact their behavior 4. Patient will discuss cognitive distortions that stem from their fears 5. Patient will verbalize statements that challenge their cognitive distortions  Summary of Patient Progress:  The patient reported that she feels "good ". The patient stated, "I am forgiving" Patient discussed her fears and how she can make positive changes in her cognitions that will positively impact her behaviors. Patient was able to discuss and process cognitive distortions that stem from  her fears. Patient actively and appropriately engaged in the group. Patient was able to provide support and validation to other group members. Patient practiced active listening when interacting with the facilitator and other group members.   Therapeutic Modalities Cognitive Behavioral Therapy Motivational Interviewing  Torian Thoennes  CUEBAS-COLON, LCSW 01/20/2018 11:12 AM

## 2018-01-21 DIAGNOSIS — Z915 Personal history of self-harm: Secondary | ICD-10-CM

## 2018-01-21 DIAGNOSIS — F17210 Nicotine dependence, cigarettes, uncomplicated: Secondary | ICD-10-CM

## 2018-01-21 DIAGNOSIS — Z8659 Personal history of other mental and behavioral disorders: Secondary | ICD-10-CM

## 2018-01-21 DIAGNOSIS — F419 Anxiety disorder, unspecified: Secondary | ICD-10-CM

## 2018-01-21 DIAGNOSIS — Z818 Family history of other mental and behavioral disorders: Secondary | ICD-10-CM

## 2018-01-21 DIAGNOSIS — E785 Hyperlipidemia, unspecified: Secondary | ICD-10-CM

## 2018-01-21 DIAGNOSIS — F4310 Post-traumatic stress disorder, unspecified: Secondary | ICD-10-CM

## 2018-01-21 DIAGNOSIS — F1210 Cannabis abuse, uncomplicated: Secondary | ICD-10-CM

## 2018-01-21 DIAGNOSIS — F429 Obsessive-compulsive disorder, unspecified: Secondary | ICD-10-CM

## 2018-01-21 MED ORDER — CLONAZEPAM 0.5 MG PO TABS: 0.5000 mg | ORAL_TABLET | Freq: Three times a day (TID) | ORAL | 1 refills | Status: AC | PRN

## 2018-01-21 MED ORDER — GABAPENTIN 300 MG PO CAPS: 300.0000 mg | ORAL_CAPSULE | Freq: Three times a day (TID) | ORAL | 0 refills | Status: AC

## 2018-01-21 MED ORDER — QUETIAPINE FUMARATE 400 MG PO TABS: 400.0000 mg | ORAL_TABLET | Freq: Every day | ORAL | 1 refills | Status: AC

## 2018-01-21 MED ORDER — FLUVOXAMINE MALEATE 50 MG PO TABS: 150.0000 mg | ORAL_TABLET | Freq: Every day | ORAL | 1 refills | Status: AC

## 2018-01-21 MED ORDER — CHLORPROMAZINE HCL 25 MG PO TABS: 25.0000 mg | ORAL_TABLET | Freq: Three times a day (TID) | ORAL | 1 refills | Status: AC

## 2018-01-21 MED ORDER — BUTALBITAL-APAP-CAFFEINE 50-325-40 MG PO TABS: 1.0000 | ORAL_TABLET | Freq: Four times a day (QID) | ORAL | 0 refills | Status: AC | PRN

## 2018-01-21 MED ORDER — GEMFIBROZIL 600 MG PO TABS: 600.0000 mg | ORAL_TABLET | Freq: Two times a day (BID) | ORAL | 1 refills | Status: AC

## 2018-01-21 MED ORDER — CLONAZEPAM 0.5 MG PO TABS
0.5000 mg | ORAL_TABLET | Freq: Once | ORAL | Status: AC
  Administered 2018-01-21: 0.5 mg via ORAL

## 2018-01-21 NOTE — Progress Notes (Signed)
[facility] MD Progress Note  01/21/2018 2:01 PM Tammy Robles [facility]  MRN:  [id number] Subjective: Endorses that she feels much better.  Endorses that the crisis situation has resolved.  Patient looks forward to the future and denies any suicidal thoughts.  Feels that her medications are going okay.  Denies any side effects.  Asks me if she can go home on Monday or Tuesday. Principal Problem: Bipolar 2 disorder (HCC) Diagnosis:   Patient Active Problem List   Diagnosis Date Noted  . Suicidal ideation [R45.851] 01/18/2018  . Overweight [E66.3] 01/02/2017  . Tobacco use disorder [F17.200] 12/23/2016  . Bipolar 2 disorder (HCC) [F31.81] 12/20/2016  . Cannabis use disorder, moderate, dependence (HCC) [F12.20] 12/20/2016  . Migraine headache [G43.909] 12/20/2016  . Borderline personality disorder (HCC) [F60.3] 11/04/2014  . Anxiety and depression [F41.9, F32.9] 03/23/2012  . PTSD (post-traumatic stress disorder) [F43.10] 03/29/2004   Total Time spent with patient: 30 minutes  Past Psychiatric History: Past psychiatric history. She was given diagnoses of bipolar 2, borderline personality, PTSD, and OCD. There was one suicide attempt in her teens. She was admitted 3 or 4 times for suicidal ideation. She was tried on Seroquel, Latuda, Lamictal, Neurontin, Prozac, Wellbutrin, Luvox and Clonazepam.  Past Medical History:  Past Medical History:  Diagnosis Date  . Bladder prolapse, female, acquired   . Endometriosis 1996  . Endometritis 1990   fever 105 post delivery  . Idiopathic intracranial hypertension   . Polycystic ovarian disease 1993    Past Surgical History:  Procedure Laterality Date  . ABDOMINAL HYSTERECTOMY    . 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 HIP ARTHROPLASTY    . hysterrectomy  1996  . LAPAROTOMY     multiple for cysts  . LEFT OOPHORECTOMY  2007  . mass off appendix    . RIGHT OOPHORECTOMY    . TONSILLECTOMY    . WRIST SURGERY  2007  and 2003   both tendon release   Family History:  Family History  Problem Relation Age of Onset  . Stroke Mother   . Cancer Father    Family Psychiatric  History: Maternal grandfather committed suicide, mother with bipolar, alcoholic father. Social History:  Social History   Substance and Sexual Activity  Alcohol Use No     Social History   Substance and Sexual Activity  Drug Use No    Social History   Socioeconomic History  . Marital status: Single    Spouse name: Not on file  . Number of children: Not on file  . Years of education: Not on file  . Highest education level: Not on file  Occupational History  . Not on file  Social Needs  . Financial resource strain: Not on file  . Food insecurity:    Worry: Not on file    Inability: Not on file  . Transportation needs:    Medical: Not on file    Non-medical: Not on file  Tobacco Use  . Smoking status: Current Some Day Smoker    Packs/day: 0.50  . Smokeless tobacco: Never Used  Substance and Sexual Activity  . Alcohol use: No  . Drug use: No  . Sexual activity: Not on file  Lifestyle  . Physical activity:    Days per week: Not on file    Minutes per session: Not on file  . Stress: Not on file  Relationships  . Social connections:    Talks  on phone: Not on file    Gets together: Not on file    Attends religious service: Not on file    Active member of club or organization: Not on file    Attends meetings of clubs or organizations: Not on file    Relationship status: Not on file  Other Topics Concern  . Not on file  Social History Narrative  . Not on file   Additional Social History:                         Sleep: Good  Appetite:  Good  Current Medications: Current Facility-Administered Medications  Medication Dose Route Frequency Provider Last Rate Last Dose  . acetaminophen (TYLENOL) tablet 650 mg  650 mg Oral Q6H PRN Clapacs, Jackquline Denmark, MD   650 mg at 01/20/18 1610  . alum & mag  hydroxide-simeth (MAALOX/MYLANTA) 200-200-20 MG/5ML suspension 30 mL  30 mL Oral Q4H PRN Clapacs, John T, MD      . butalbital-acetaminophen-caffeine (FIORICET, ESGIC) [phone] MG per tablet 1 tablet  1 tablet Oral Q6H PRN Aundria Rud, MD   1 tablet at 01/21/18 0820  . chlorproMAZINE (THORAZINE) tablet 25 mg  25 mg Oral TID Pucilowska, Jolanta B, MD   25 mg at 01/21/18 1156  . clonazePAM (KLONOPIN) tablet 0.5 mg  0.5 mg Oral TID PRN Clapacs, Jackquline Denmark, MD   0.5 mg at 01/21/18 0820  . fluvoxaMINE (LUVOX) tablet 100 mg  100 mg Oral QHS Pucilowska, Jolanta B, MD   100 mg at 01/20/18 2118  . gabapentin (NEURONTIN) capsule 300 mg  300 mg Oral TID Aundria Rud, MD   300 mg at 01/21/18 1156  . gemfibrozil (LOPID) tablet 600 mg  600 mg Oral BID AC Pucilowska, Jolanta B, MD   600 mg at 01/21/18 0818  . loperamide (IMODIUM) capsule 2 mg  2 mg Oral PRN Aundria Rud, MD   2 mg at 01/20/18 1859  . magnesium hydroxide (MILK OF MAGNESIA) suspension 30 mL  30 mL Oral Daily PRN Clapacs, John T, MD      . nicotine (NICODERM CQ - dosed in mg/24 hours) patch 21 mg  21 mg Transdermal Q0600 Pucilowska, Jolanta B, MD   21 mg at 01/21/18 0838  . prochlorperazine (COMPAZINE) tablet 10 mg  10 mg Oral Q6H PRN Clapacs, John T, MD      . QUEtiapine (SEROQUEL) tablet 400 mg  400 mg Oral QHS Pucilowska, Jolanta B, MD   400 mg at 01/20/18 2118    Lab Results:  Results for orders placed or performed during the hospital encounter of 01/18/18 (from the past 48 hour(s))  T4, free     Status: None   Collection Time: 01/19/18  5:15 PM  Result Value Ref Range   Free T4 0.83 0.82 - 1.77 ng/dL    Comment: (NOTE) Biotin ingestion may interfere with free T4 tests. If the results are inconsistent with the TSH level, previous test results, or the clinical presentation, then consider biotin interference. If needed, order repeat testing after stopping biotin. Performed at [facility], [address]  [place], [place] [address]     Blood Alcohol level:  Lab Results  Component Value Date   [facility] <10 01/17/2018   ETH <10 10/21/2017    Metabolic Disorder Labs: Lab Results  Component Value Date   HGBA1C 5.7 (H) 01/17/2018   MPG 116.89 01/17/2018   MPG 105.41 12/22/2016   No results  found for: PROLACTIN Lab Results  Component Value Date   CHOL 214 (H) 01/17/2018   TRIG 408 (H) 01/17/2018   HDL 54 01/17/2018   CHOLHDL 4.0 01/17/2018   VLDL UNABLE TO CALCULATE IF TRIGLYCERIDE OVER 400 mg/dL 16/10/960409/03/2018   LDLCALC UNABLE TO CALCULATE IF TRIGLYCERIDE OVER 400 mg/dL 54/09/811909/03/2018   LDLCALC 93 12/20/2016    Physical Findings: AIMS:  , ,  ,  ,    CIWA:    COWS:     Musculoskeletal: Strength & Muscle Tone: Decreased in lower extremities, needs a walker for ambulation Gait & Station: Relates with a walker Patient leans: N/A  Psychiatric Specialty Exam: Physical Exam  ROS  Blood pressure (!) 98/57, pulse 95, temperature 98.5 F (36.9 C), resp. rate 18, height 5\' 7"  (1.702 m), weight 128.6 kg, SpO2 96 %.Body mass index is 44.4 kg/m.  General Appearance: Neat and Well Groomed  Eye Contact:  Fair  Speech:  Normal Rate  Volume:  Normal  Mood:  Anxious  Affect: Reactive  Thought Process:  Coherent  Orientation:  Full (Time, Place, and Person)  Thought Content:  Logical  Suicidal Thoughts:  No  Homicidal Thoughts:  No  Memory:  NA  Judgement:  Impaired, improving  Insight:  Fair  Psychomotor Activity:  Normal  Assets:  Communication Skills Desire for Improvement  ADL's:  Impaired  Cognition:  WNL  Sleep:  Number of Hours: 7.5     Treatment Plan Summary: Daily contact with patient to assess and evaluate symptoms and progress in treatment and Medication management Tammy Robles is a 47 year old female with a istory of depression, anxiety and mood instability admitted for suicidal ideation with a plan to overdose on medications in the context of major loss and mounting health  problems.  #Suicidal ideation -patient able to contract for safety in the hospital  #Mood and anxiety -continue Seroquel 400 mg nightly for mood stabilization -Continue Luvox at 100 mg nightly for OCD, PTSD -lower Thorazine to 25 mg TID for "racing thoughts" -Restart gabapentin that she was on as a home medication.  Restarted at 300 mg thrice daily  #Smoking cessation -nicotine patch is available  #Dyslipidemia -start Lopid 600 mg BID  #Elevated TSH -T4 WNL  #Cannabis abuse -minimizes problems and declines   #Labs -lipid panel, TSH and A1C are all elevated -EKG, reviewed, NSR with QTc 435 -pregnancy test is negative  #Disposition -patient will return to home -follow up with Tammy SpryRINITY Quintin Hjort, MD 01/21/2018, 2:01 PM

## 2018-01-21 NOTE — [facility] Group Notes (Signed)
[facility] Group Notes:  (Nursing/MHT/Case Management/Adjunct)  Date:  01/21/2018  Time:  12:43 AM  Type of Therapy:  Group Therapy  Participation Level:  Active  Participation Quality:  Appropriate  Affect:  Appropriate  Cognitive:  Alert  Insight:  Good  Engagement in Group:  Engaged  Modes of Intervention:  Support  Summary of Progress/Problems:  Mayra NeerJackie L Geoffrey Mankin 01/21/2018, 12:43 AM

## 2018-01-21 NOTE — Progress Notes (Signed)
Received Tammy Robles this AM after breakfast, she was compliant with her medications and received a PRN Klonopin for anxiety and Fioricet for a headache. She continues to walk with a walker. She rated anxiety 5/10, depression 4/10 and hopelessness 4.10 on hr self inventory form. Later she c/o chest pains that increased and decreased when taking a deep breath, her VS was monitored, received a one time dose of Klonopin 0.5 mg and Mylanta. She feels better and no acute distress noted.

## 2018-01-21 NOTE — [facility] Suicide Risk Assessment (Signed)
[facility]   Principal Problem: Bipolar 2 disorder [facility](HCC) Discharge Diagnoses:  Patient Active Problem List   Diagnosis Date Noted  . Bipolar 2 disorder (HCC) [F31.81] 12/20/2016    Priority: High  . Suicidal ideation [R45.851] 01/18/2018  . Overweight [E66.3] 01/02/2017  . Tobacco use disorder [F17.200] 12/23/2016  . Cannabis use disorder, moderate, dependence (HCC) [F12.20] 12/20/2016  . Migraine headache [G43.909] 12/20/2016  . Borderline personality disorder (HCC) [F60.3] 11/04/2014  . Anxiety and depression [F41.9, F32.9] 03/23/2012  . PTSD (post-traumatic stress disorder) [F43.10] 03/29/2004    Total Time spent with patient: 20 minutes  Musculoskeletal: Strength & Muscle Tone: within normal limits Gait & Station: normal Patient leans: N/A  Psychiatric Specialty Exam: Review of Systems  Neurological: Negative.   Psychiatric/Behavioral: Negative.   All other systems reviewed and are negative.   Blood pressure 114/80, pulse 83, temperature 97.7 F (36.5 C), temperature source Oral, resp. rate 18, height 5\' 7"  (1.702 m), weight 128.6 kg, SpO2 97 %.Body mass index is 44.4 kg/m.  General Appearance: Casual  Eye Contact::  Good  Speech:  Clear and Coherent409  Volume:  Normal  Mood:  Euthymic  Affect:  Appropriate  Thought Process:  Goal Directed and Descriptions of Associations: Intact  Orientation:  Full (Time, Place, and Person)  Thought Content:  WDL  Suicidal Thoughts:  No  Homicidal Thoughts:  No  Memory:  Immediate;   Fair Recent;   Fair Remote;   Fair  Judgement:  Fair  Insight:  Present  Psychomotor Activity:  Normal  Concentration:  Fair  Recall:  [employer]Fair  Fund of Knowledge:Fair  Language: Fair  Akathisia:  No  Handed:  Right  AIMS (if indicated):     Assets:  Communication Skills Desire for Improvement Housing Resilience Social Support  Sleep:  Number of Hours: 6.45  Cognition: WNL  ADL's:  Intact   Mental Status Per  Nursing Assessment::   On Admission:  Suicidal ideation indicated by patient  Demographic Factors:  Divorced or widowed, Caucasian, Low socioeconomic status and Unemployed  Loss Factors: Decline in physical health and Financial problems/change in socioeconomic status  Historical Factors: Prior suicide attempts, Family history of mental illness or substance abuse and Impulsivity  Risk Reduction Factors:   Sense of responsibility to family, Living with another person, especially a relative and Positive therapeutic relationship  Continued Clinical Symptoms:  Bipolar Disorder:   Depressive phase  Cognitive Features That Contribute To Risk:  None    Suicide Risk:  Minimal: No identifiable suicidal ideation.  Patients presenting with no risk factors but with morbid ruminations; may be classified as minimal risk based on the severity of the depressive symptoms  Follow-up Information    [facility], [facility]. Go on 01/23/2018.   Why:  Walk in Monday-Friday between 9am-3pm for hospital follow up appointment. Contact information: [address] [place] [address] [phone]           Plan Of Care/Follow-up recommendations:  Activity:  as tolerated Diet:  low sodium eart healthy Other:  keep follow up appointments  Kristine LineaJolanta Jahaan Vanwagner, MD 01/22/2018, 10:19 AM

## 2018-01-21 NOTE — [facility] Group Notes (Signed)
LCSW Group Therapy Note 01/21/2018 1:15pm  Type of Therapy and Topic: Group Therapy: Feelings Around Returning Home & Establishing a Supportive Framework and Supporting Oneself When Supports Not Available  Participation Level: Active  Description of Group:  Patients first processed thoughts and feelings about upcoming discharge. These included fears of upcoming changes, lack of change, new living environments, judgements and expectations from others and overall stigma of mental health issues. The group then discussed the definition of a supportive framework, what that looks and feels like, and how do to discern it from an unhealthy non-supportive network. The group identified different types of supports as well as what to do when your family/friends are less than helpful or unavailable  Therapeutic Goals  1. Patient will identify one healthy supportive network that they can use at discharge. 2. Patient will identify one factor of a supportive framework and how to tell it from an unhealthy network. 3. Patient able to identify one coping skill to use when they do not have positive supports from others. 4. Patient will demonstrate ability to communicate their needs through discussion and/or role plays.  Summary of Patient Progress:  Pt reports she feels "good." Pt engaged during group session. As patients processed their anxiety about discharge and described healthy supports patient shared she is ready to be discharge. She states that meds are helping her feel better and her family is closer.  Patients identified at least one self-care tool they were willing to use after discharge; deep breathing.   Therapeutic Modalities Cognitive Behavioral Therapy Motivational Interviewing   Tammy Robles  CUEBAS-COLON, LCSW 01/21/2018 10:05 AM

## 2018-01-21 NOTE — Plan of Care (Addendum)
Patient found in common area upon my arrival. Patient is visible and social this evening. Patient mood and affect unchanged. Remains depressed and anxious with passive SI with no plan or intent. Denies HI/AVH. Patient is pleasant and cooperative. Given Klonopin for anxiety and Imodium for loose stools, both with positive results. Reports eating adequately. Compliant with HS medications and staff direction. Q 15 minute checks maintained. Will continue to monitor throughout the shift. Patient slept 7.5 hours. No apparent distress. Will endorse care to oncoming shift.   Problem: Education: Goal: Knowledge of [place] General Education information/materials will improve Outcome: Progressing Goal: Emotional status will improve Outcome: Progressing Goal: Mental status will improve Outcome: Progressing   Problem: Coping: Goal: Coping ability will improve Outcome: Progressing Goal: Will verbalize feelings Outcome: Progressing   Problem: Education: Goal: Knowledge of General Education information will improve Description Including pain rating scale, medication(s)/side effects and non-pharmacologic comfort measures Outcome: Progressing

## 2018-01-22 MED ORDER — TOPIRAMATE 100 MG PO TABS: 200.0000 mg | ORAL_TABLET | Freq: Every day | ORAL | Status: DC

## 2018-01-22 MED ORDER — TOPIRAMATE 200 MG PO TABS: 200.0000 mg | ORAL_TABLET | Freq: Every day | ORAL | 1 refills | Status: AC

## 2018-01-22 NOTE — Progress Notes (Signed)
Recreation Therapy Notes  INPATIENT RECREATION TR PLAN  Patient Details Name: Tammy Robles [facility] MRN: [id number] DOB: [birth year] Today's Date: 01/22/2018  Rec Therapy Plan Is patient appropriate for Therapeutic Recreation?: Yes Treatment times per week: at least 3 Estimated Length of Stay: 5-7 days TR Treatment/Interventions: Group participation (Comment)  Discharge Criteria Pt will be discharged from therapy if:: Discharged Treatment plan/goals/alternatives discussed and agreed upon by:: Patient/family  Discharge Summary Short term goals set: Patient will identify 3 triggers for depression within 5 recreation therapy group sessions Short term goals met: Complete Progress toward goals comments: Groups attended Which groups?: Communication, Leisure education Reason goals not met: N/A Therapeutic equipment acquired: N/A Reason patient discharged from therapy: Discharge from hospital Pt/family agrees with progress & goals achieved: Yes Date patient discharged from therapy: 01/22/18   Rohin Krejci 01/22/2018, 3:19 PM

## 2018-01-22 NOTE — Progress Notes (Signed)
D: Patient is aware of  Discharge this shift .Patient denies suicidal /homicidal ideations. Patient received all belongings brought in  A Received Storage medications. Writer reviewed Discharge Summary, Suicide Risk Assessment, and Transitional Record. Patient also received Prescriptions   from  MD. A 7 day supply of medications given to patient . Aware  Of follow up appointment . R: Patient left unit with no questions  Or concerns  With roommate

## 2018-01-22 NOTE — Progress Notes (Signed)
D: Patient is aware of  Discharge this shift .Patient denies suicidal /homicidal ideations. Patient received all belongings brought in  A: Received  Storage medications. Writer reviewed Discharge Summary, Suicide Risk Assessment, and Transitional Record. Patient also received Prescriptions   from  MD. A 7 day supply of medications given to patient  Aware  Of follow up appointment . R: Patient left unit with no questions  Or concerns  With  roomate
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
## 2018-01-22 NOTE — Progress Notes (Signed)
  [facility] :  Will you be returning to the same living situation after discharge:  Yes,    At discharge, do you have transportation home?: Yes,    Do you have the ability to pay for your medications: Yes,     Release of information consent forms completed and in the chart;  Patient's signature needed at discharge.  Patient to Follow up at: Follow-up Information    [facility]c, [facility]. Go on 01/23/2018.   Why:  Walk in Monday-Friday between 9am-3pm for hospital follow up appointment. Contact information: [address] [place] [address] [phone]           Next level of care provider [facility]:no  Safety Planning and Suicide Prevention discussed: Yes,     Have you used any form of tobacco in the last 30 days? (Cigarettes, Smokeless Tobacco, Cigars, and/or Pipes): No  Has patient been referred to the Quitline?: Patient refused referral  Patient has been referred for addiction treatment: Yes  Glennon MacSara P [place], LCSW 01/22/2018, 9:46 AM

## 2018-01-22 NOTE — Progress Notes (Signed)
D: Pt denies SI/HI/AVH, contracts for safety. Pt is pleasant and cooperative, presenting with some anxiety about discharge. Prn medications given with good effect. Pt. Complains of hip pain that is addressed with PRN medications with good effect.  Patient Interaction appropriate and engages well. Pt. Given education on front wheel walker safety.    A: Q x 15 minute observation checks were completed for safety. Patient was provided with education.  Patient was given/offered medications per orders. Patient  was encourage to attend groups, participate in unit activities and continue with plan of care. Pt. Chart and plans of care reviewed. Pt. Given support and encouragement.   R: Patient is complaint with medication and unit procedures. Appropriate during groups and snacks.             Precautionary checks every 15 minutes for safety maintained, room free of safety hazards, patient sustains no injury or falls during this shift. Will endorse care to next shift.

## 2018-01-22 NOTE — [facility] Suicide Risk Assessment (Signed)
[facility] INPATIENT:  Family/Significant Other Suicide Prevention Education  Suicide Prevention Education:  Patient Refusal for Family/Significant Other Suicide Prevention Education: The patient Tammy Robles has refused to provide written consent for family/significant other to be provided Family/Significant Other Suicide Prevention Education during admission and/or prior to discharge.  Physician notified.  Cleda DaubSara P Rhiana Morash, LCSW 01/22/2018, 9:42 AM

## 2018-01-22 NOTE — Plan of Care (Signed)
Pt. Verbalizes understanding of provided education. Pt. Denies Si/HI and verbally is able to contract for safety. Pt. Reports she had a good day today.    Problem: Education: Goal: Knowledge of [facility] Education information/materials will improve Outcome: Progressing Goal: Mental status will improve Outcome: Progressing   Problem: Self-Concept: Goal: Will verbalize positive feelings about self Outcome: Progressing

## 2018-01-22 NOTE — Progress Notes (Signed)
Recreation Therapy Notes   Date: 01/22/2018  Time: 9:30 am  Location: Craft Room  Behavioral response: Appropriate    Intervention Topic: Communication  Discussion/Intervention:  Group content today was focused on communication. The group defined communication and ways to communicate with others. Individuals stated reason why communication is important and some reasons to communicate with others. Patients expressed if they thought they were good at communicating with others and ways they could improve their communication skills. The group identified important parts of communication and some experiences they have had in the past with communication. The group participated in the intervention "What is that?", where they had a chance to test out their communication skills and identify ways to improve their communication techniques.  Clinical Observations/Feedback:  Patient came to group and identified texting as way she normally communicates with others. Individual was social with peers and staff while participating in the intervention.  Jimi Giza LRT/CTRS          Elvert Cumpton 01/22/2018 1:18 PM

## 2018-01-22 NOTE — [facility] Group Notes (Signed)
[facility] Group Notes:  (Nursing/MHT/Case Management/Adjunct)  Date:  01/22/2018  Time:  9:37 AM  Type of Therapy:  Psychoeducational Skills  Participation Level:  Active  Participation Quality:  Attentive and Sharing  Affect:  Flat  Cognitive:  Appropriate and Oriented  Insight:  Good  Engagement in Group:  Engaged  Modes of Intervention:  Discussion and Exploration  Summary of Progress/Problems:  Tammy Robles 01/22/2018, 9:37 AM

## 2018-01-30 ENCOUNTER — Emergency Department
Admission: EM | Admit: 2018-01-30 | Discharge: 2018-01-30 | Disposition: A | Discharge status: Home / Self Care | Payer: Medicaid Other | Attending: Emergency Medicine | Admitting: Emergency Medicine

## 2018-01-30 ENCOUNTER — Emergency Department: Payer: Medicaid Other

## 2018-01-30 ENCOUNTER — Encounter: Payer: Self-pay | Admitting: Medical Oncology

## 2018-01-30 DIAGNOSIS — S8992XA Unspecified injury of left lower leg, initial encounter: Secondary | ICD-10-CM | POA: Diagnosis present

## 2018-01-30 DIAGNOSIS — F17210 Nicotine dependence, cigarettes, uncomplicated: Secondary | ICD-10-CM | POA: Diagnosis not present

## 2018-01-30 DIAGNOSIS — Y998 Other external cause status: Secondary | ICD-10-CM | POA: Insufficient documentation

## 2018-01-30 DIAGNOSIS — S80212A Abrasion, left knee, initial encounter: Secondary | ICD-10-CM | POA: Diagnosis not present

## 2018-01-30 DIAGNOSIS — Z96649 Presence of unspecified artificial hip joint: Secondary | ICD-10-CM | POA: Diagnosis not present

## 2018-01-30 DIAGNOSIS — Z79899 Other long term (current) drug therapy: Secondary | ICD-10-CM | POA: Insufficient documentation

## 2018-01-30 DIAGNOSIS — W01198A Fall on same level from slipping, tripping and stumbling with subsequent striking against other object, initial encounter: Secondary | ICD-10-CM | POA: Diagnosis not present

## 2018-01-30 DIAGNOSIS — M25562 Pain in left knee: Secondary | ICD-10-CM | POA: Insufficient documentation

## 2018-01-30 DIAGNOSIS — Y9301 Activity, walking, marching and hiking: Secondary | ICD-10-CM | POA: Diagnosis not present

## 2018-01-30 DIAGNOSIS — Z91040 Latex allergy status: Secondary | ICD-10-CM | POA: Insufficient documentation

## 2018-01-30 DIAGNOSIS — W19XXXA Unspecified fall, initial encounter: Secondary | ICD-10-CM

## 2018-01-30 DIAGNOSIS — R0781 Pleurodynia: Secondary | ICD-10-CM | POA: Diagnosis not present

## 2018-01-30 DIAGNOSIS — Y929 Unspecified place or not applicable: Secondary | ICD-10-CM | POA: Diagnosis not present

## 2018-01-30 MED ORDER — OXYCODONE-ACETAMINOPHEN 5-325 MG PO TABS: 1.0000 | ORAL_TABLET | ORAL | 0 refills | Status: AC | PRN

## 2018-01-30 MED ORDER — KETOROLAC TROMETHAMINE 30 MG/ML IJ SOLN
30.0000 mg | Freq: Once | INTRAMUSCULAR | Status: AC
  Administered 2018-01-30: 30 mg via INTRAMUSCULAR
  Filled 2018-01-30: qty 1

## 2018-01-30 MED ORDER — OXYCODONE HCL 5 MG PO TABS: 5.0000 mg | ORAL_TABLET | Freq: Once | ORAL | Status: DC

## 2018-01-30 NOTE — ED Triage Notes (Signed)
Pt reports tripping and falling this am, has abrasion to left knee and states that she hit a metal clock to left side of rib cage.

## 2018-01-30 NOTE — ED Provider Notes (Signed)
[facility] Emergency Department Provider Note  ____________________________________________  Time seen: Approximately 8:00 AM  I have reviewed the triage vital signs and the nursing notes.   HISTORY  Chief Complaint Fall    HPI Tammy HouseholderKelly Leigh Robles is a 47 y.o. female that presents emergency department for evaluation of left sided rib and knee pain after falling this morning.  Patient tripped this morning and landed on her left knee.  She has an abrasion to her left knee.  She hit her left rib cage on a metal clock.  She has pain with deep breathing.  She did not hit her head.  She has been able to walk and bear weight since fall.  Tetanus shot is up-to-date.  No nausea, vomiting, abdominal pain.   Past Medical History:  Diagnosis Date  . Bladder prolapse, female, acquired   . Endometriosis 1996  . Endometritis 1990   fever 105 post delivery  . Idiopathic intracranial hypertension   . Polycystic ovarian disease 1993    Patient Active Problem List   Diagnosis Date Noted  . Suicidal ideation 01/18/2018  . Overweight 01/02/2017  . Tobacco use disorder 12/23/2016  . Bipolar 2 disorder (HCC) 12/20/2016  . Cannabis use disorder, moderate, dependence (HCC) 12/20/2016  . Migraine headache 12/20/2016  . Borderline personality disorder (HCC) 11/04/2014  . Anxiety and depression 03/23/2012  . PTSD (post-traumatic stress disorder) 03/29/2004    Past Surgical History:  Procedure Laterality Date  . ABDOMINAL HYSTERECTOMY    . 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 HIP ARTHROPLASTY    . hysterrectomy  1996  . LAPAROTOMY     multiple for cysts  . LEFT OOPHORECTOMY  2007  . mass off appendix    . RIGHT OOPHORECTOMY    . TONSILLECTOMY    . WRIST SURGERY  2007 and 2003   both tendon release    Prior to Admission medications   Medication Sig Start Date End Date Taking? Authorizing Provider   butalbital-acetaminophen-caffeine (FIORICET, ESGIC) 50-325-40 MG tablet Take 1 tablet by mouth every 6 (six) hours as needed for headache. 01/21/18   Pucilowska, Jolanta B, MD  chlorproMAZINE (THORAZINE) 25 MG tablet Take 1 tablet (25 mg total) by mouth 3 (three) times daily. 01/22/18   Pucilowska, Braulio ConteJolanta B, MD  clonazePAM (KLONOPIN) 0.5 MG tablet Take 1 tablet (0.5 mg total) by mouth 3 (three) times daily as needed. 01/21/18   Pucilowska, Jolanta B, MD  fluvoxaMINE (LUVOX) 50 MG tablet Take 3 tablets (150 mg total) by mouth at bedtime. 01/21/18   Pucilowska, Braulio ConteJolanta B, MD  gabapentin (NEURONTIN) 300 MG capsule Take 1 capsule (300 mg total) by mouth 3 (three) times daily. 01/21/18 01/21/19  Pucilowska, Ellin GoodieJolanta B, MD  gemfibrozil (LOPID) 600 MG tablet Take 1 tablet (600 mg total) by mouth 2 (two) times daily before a meal. 01/22/18   Pucilowska, Jolanta B, MD  oxyCODONE-acetaminophen (PERCOCET) 5-325 MG tablet Take 1 tablet by mouth every 4 (four) hours as needed for severe pain. 01/30/18 01/30/19  Enid DerryWagner, Georgi Tuel, PA-C  QUEtiapine (SEROQUEL) 400 MG tablet Take 1 tablet (400 mg total) by mouth at bedtime. 01/21/18   Pucilowska, Braulio ConteJolanta B, MD  topiramate (TOPAMAX) 200 MG tablet Take 1 tablet (200 mg total) by mouth at bedtime. 01/22/18   Pucilowska, Ellin GoodieJolanta B, MD    Allergies Latex; Propoxyphene; Amoxicillin; Darvocet [propoxyphene n-acetaminophen]; Metoclopramide; Sulfa antibiotics; and Sulfacetamide sodium  Family History  Problem Relation  Age of Onset  . Stroke Mother   . Cancer Father     Social History Social History   Tobacco Use  . Smoking status: Current Some Day Smoker    Packs/day: 0.50  . Smokeless tobacco: Never Used  Substance Use Topics  . Alcohol use: No  . Drug use: No     Review of Systems  Cardiovascular: No chest pain. Respiratory: No cough. Gastrointestinal: No abdominal pain.  No nausea, no vomiting.  Musculoskeletal: Positive for rib and knee pain. Skin: Negative for  rash, lacerations, ecchymosis.  Positive for abrasion. Neurological: Negative for headaches, numbness or tingling   ____________________________________________   PHYSICAL EXAM:  VITAL SIGNS: ED Triage Vitals [01/30/18 0736]  Enc Vitals Group     BP 123/77     Pulse Rate (!) 101     Resp 18     Temp 97.8 F (36.6 C)     Temp Source Oral     SpO2 98 %     Weight 280 lb (127 kg)     Height 5\' 7"  (1.702 m)     Head Circumference      Peak Flow      Pain Score 8     Pain Loc      Pain Edu?      Excl. in GC?      Constitutional: Alert and oriented. Well appearing and in no acute distress. Eyes: Conjunctivae are normal. PERRL. EOMI. Head: Atraumatic. ENT:      Ears:      Nose: No congestion/rhinnorhea.      Mouth/Throat: Mucous membranes are moist.  Neck: No stridor.   Cardiovascular: Normal rate, regular rhythm.  Good peripheral circulation. Respiratory: Normal respiratory effort without tachypnea or retractions. Lungs CTAB. Good air entry to the bases with no decreased or absent breath sounds. Gastrointestinal: Bowel sounds 4 quadrants. Soft and nontender to palpation. No guarding or rigidity. No palpable masses. No distention. Musculoskeletal: Full range of motion to all extremities. No gross deformities appreciated.  Tenderness to palpation over left inferior rib cage.  No ecchymosis.  Abrasion to left knee.  Full range of motion of knee.  Normal but slow gait.  Weightbearing. Neurologic:  Normal speech and language. No gross focal neurologic deficits are appreciated.  Skin:  Skin is warm, dry and intact. No rash noted. Psychiatric: Mood and affect are normal. Speech and behavior are normal. Patient exhibits appropriate insight and judgement.   ____________________________________________   LABS (all labs ordered are listed, but only abnormal results are displayed)  Labs Reviewed - No data to  display ____________________________________________  EKG   ____________________________________________  RADIOLOGY Lexine Baton, personally viewed and evaluated these images (plain radiographs) as part of my medical decision making, as well as reviewing the written report by the radiologist.  Dg Ribs Unilateral W/chest Left  Result Date: 01/30/2018 CLINICAL DATA:  Pain following fall EXAM: LEFT RIBS AND CHEST - 3+ VIEW COMPARISON:  Chest radiograph January 12, 2018 FINDINGS: Frontal chest as well as oblique and cone-down rib images were obtained. Lungs are clear. Heart size and pulmonary vascularity are normal. No adenopathy. There is no pneumothorax or pleural effusion. No rib fracture evident. IMPRESSION: No evident rib fracture. No edema or consolidation. No pneumothorax. Electronically Signed   By: Bretta Bang III M.D.   On: 01/30/2018 08:49   Dg Knee Complete 4 Views Left  Result Date: 01/30/2018 CLINICAL DATA:  Pain following fall EXAM: LEFT KNEE - COMPLETE 4+ VIEW  COMPARISON:  None. FINDINGS: Frontal, lateral, and bilateral oblique views were obtained. No fracture or dislocation. No joint effusion. There is spurring in all compartments. There is mild to moderate narrowing of the patellofemoral joint. There is relatively mild narrowing medially. No erosive change. 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 No fracture dislocation. No joint effusion. Electronically Signed   By: Bretta Bang III M.D.   On: 01/30/2018 08:48    ____________________________________________    PROCEDURES  Procedure(s) performed:    Procedures    Medications  ketorolac (TORADOL) 30 MG/ML injection 30 mg (30 mg Intramuscular Given 01/30/18 0909)     ____________________________________________   INITIAL IMPRESSION / ASSESSMENT AND PLAN / ED COURSE  Pertinent labs & imaging results that were available during my care  of the patient were reviewed by me and considered in my medical decision making (see chart for details).  Review of the [place] CSRS was performed in accordance of the [facility] prior to dispensing any controlled drugs.   Patient presented to the emergency department for evaluation after fall.  Vital signs and exam are reassuring.  X-ray negative for acute bony abnormalities.  Patient will be discharged home with prescriptions for a very short course of Percocet. Patient is to follow up with primary care as directed. Patient is given ED precautions to return to the ED for any worsening or new symptoms.     ____________________________________________  FINAL CLINICAL IMPRESSION(S) / ED DIAGNOSES  Final diagnoses:  Fall, initial encounter  Rib pain  Acute pain of left knee      NEW MEDICATIONS STARTED DURING THIS VISIT:  ED Discharge Orders         Ordered    oxyCODONE-acetaminophen (PERCOCET) 5-325 MG tablet  Every 4 hours PRN     01/30/18 0934              This chart was dictated using voice recognition software/Dragon. Despite best efforts to proofread, errors can occur which can change the meaning. Any change was purely unintentional.    Enid Derry, PA-C 01/30/18 1412    Jene Every, MD 01/30/18 [phone]

## 2018-01-30 NOTE — ED Notes (Signed)
See triage note  States she tripped   Landed on left side abrasions noted to left ankle and knee  Also having pain to left lateral rib and hip area  No bruising noted but area is tender on palpation

## 2018-02-01 ENCOUNTER — Encounter: Payer: Self-pay | Admitting: *Deleted

## 2018-02-01 ENCOUNTER — Emergency Department: Payer: Medicaid Other

## 2018-02-01 ENCOUNTER — Other Ambulatory Visit: Payer: Self-pay

## 2018-02-01 ENCOUNTER — Emergency Department
Admission: EM | Admit: 2018-02-01 | Discharge: 2018-02-02 | Disposition: A | Discharge status: Home / Self Care | Payer: Medicaid Other | Attending: Emergency Medicine | Admitting: Emergency Medicine

## 2018-02-01 DIAGNOSIS — F419 Anxiety disorder, unspecified: Secondary | ICD-10-CM | POA: Insufficient documentation

## 2018-02-01 DIAGNOSIS — S299XXA Unspecified injury of thorax, initial encounter: Secondary | ICD-10-CM | POA: Diagnosis present

## 2018-02-01 DIAGNOSIS — F1220 Cannabis dependence, uncomplicated: Secondary | ICD-10-CM | POA: Insufficient documentation

## 2018-02-01 DIAGNOSIS — F329 Major depressive disorder, single episode, unspecified: Secondary | ICD-10-CM | POA: Diagnosis not present

## 2018-02-01 DIAGNOSIS — Z91040 Latex allergy status: Secondary | ICD-10-CM | POA: Diagnosis not present

## 2018-02-01 DIAGNOSIS — Z79899 Other long term (current) drug therapy: Secondary | ICD-10-CM | POA: Diagnosis not present

## 2018-02-01 DIAGNOSIS — Y929 Unspecified place or not applicable: Secondary | ICD-10-CM | POA: Diagnosis not present

## 2018-02-01 DIAGNOSIS — S20212A Contusion of left front wall of thorax, initial encounter: Secondary | ICD-10-CM | POA: Diagnosis not present

## 2018-02-01 DIAGNOSIS — F17200 Nicotine dependence, unspecified, uncomplicated: Secondary | ICD-10-CM | POA: Insufficient documentation

## 2018-02-01 DIAGNOSIS — Y9389 Activity, other specified: Secondary | ICD-10-CM | POA: Diagnosis not present

## 2018-02-01 DIAGNOSIS — W0110XA Fall on same level from slipping, tripping and stumbling with subsequent striking against unspecified object, initial encounter: Secondary | ICD-10-CM | POA: Insufficient documentation

## 2018-02-01 DIAGNOSIS — Z96649 Presence of unspecified artificial hip joint: Secondary | ICD-10-CM | POA: Insufficient documentation

## 2018-02-01 DIAGNOSIS — R531 Weakness: Secondary | ICD-10-CM | POA: Diagnosis not present

## 2018-02-01 DIAGNOSIS — Y999 Unspecified external cause status: Secondary | ICD-10-CM | POA: Insufficient documentation

## 2018-02-01 LAB — CBC
HCT: 37 % (ref 35.0–47.0)
HEMOGLOBIN: 13 g/dL (ref 12.0–16.0)
MCH: 31.4 pg (ref 26.0–34.0)
MCHC: 35.3 g/dL (ref 32.0–36.0)
MCV: 88.9 fL (ref 80.0–100.0)
Platelets: 314 10*3/uL (ref 150–440)
RBC: 4.16 MIL/uL (ref 3.80–5.20)
RDW: 13.6 % (ref 11.5–14.5)
WBC: 7.1 10*3/uL (ref 3.6–11.0)

## 2018-02-01 LAB — TROPONIN I: Troponin I: <0.03 ng/mL (ref <0.03)

## 2018-02-01 LAB — BASIC METABOLIC PANEL
ANION GAP: 10 (ref 5–15)
BUN: 21 mg/dL — ABNORMAL HIGH (ref 6–20)
CHLORIDE: 105 mmol/L (ref 98–111)
CO2: 21 mmol/L — AB (ref 22–32)
Calcium: 9.9 mg/dL (ref 8.9–10.3)
Creatinine, Ser: 1.31 mg/dL — ABNORMAL HIGH (ref 0.44–1.00)
GFR calc non Af Amer: 47 mL/min — ABNORMAL LOW (ref >60)
GFR, EST AFRICAN AMERICAN: 55 mL/min — AB (ref >60)
Glucose, Bld: 95 mg/dL (ref 70–99)
Potassium: 4.2 mmol/L (ref 3.5–5.1)
Sodium: 136 mmol/L (ref 135–145)

## 2018-02-01 NOTE — ED Triage Notes (Signed)
Pt to triage via wheelchair.  Pt fell again today.  Pt seen in er recently for falls.  Pt has generalized weakness for several days.  Pt alert  Speech clear.

## 2018-02-02 MED ORDER — SODIUM CHLORIDE 0.9 % IV BOLUS
1000.0000 mL | Freq: Once | INTRAVENOUS | Status: AC
  Administered 2018-02-02: 1000 mL via INTRAVENOUS

## 2018-02-02 MED ORDER — TRAMADOL HCL 50 MG PO TABS
50.0000 mg | ORAL_TABLET | Freq: Once | ORAL | Status: AC
  Administered 2018-02-02: 50 mg via ORAL
  Filled 2018-02-02: qty 1

## 2018-02-02 NOTE — ED Provider Notes (Signed)
[facility] Emergency Department Provider Note    First MD Initiated Contact with Patient 02/01/18 2359     (approximate)  I have reviewed the triage vital signs and the nursing notes.   HISTORY  Chief Complaint Fall and Weakness    HPI Tammy Robles is a 47 y.o. female with below list of chronic medical conditions presents to the emergency department with multiple medical complaints including generalized weakness which patient states has been going on for months, continued discomfort in the area of a bruise secondary to fall with the patient was seen in the emergency department yesterday.  Patient denies any chest pain or shortness of breath.  Patient denies any headache dizziness nausea or vomiting.  Patient does admit to generalized tremulousness   Past Medical History:  Diagnosis Date  . Bladder prolapse, female, acquired   . Endometriosis 1996  . Endometritis 1990   fever 105 post delivery  . Idiopathic intracranial hypertension   . Polycystic ovarian disease 1993    Patient Active Problem List   Diagnosis Date Noted  . Suicidal ideation 01/18/2018  . Overweight 01/02/2017  . Tobacco use disorder 12/23/2016  . Bipolar 2 disorder (HCC) 12/20/2016  . Cannabis use disorder, moderate, dependence (HCC) 12/20/2016  . Migraine headache 12/20/2016  . Borderline personality disorder (HCC) 11/04/2014  . Anxiety and depression 03/23/2012  . PTSD (post-traumatic stress disorder) 03/29/2004    Past Surgical History:  Procedure Laterality Date  . ABDOMINAL HYSTERECTOMY    . 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 HIP ARTHROPLASTY    . hysterrectomy  1996  . LAPAROTOMY     multiple for cysts  . LEFT OOPHORECTOMY  2007  . mass off appendix    . RIGHT OOPHORECTOMY    . TONSILLECTOMY    . WRIST SURGERY  2007 and 2003   both tendon release    Prior to Admission medications   Medication Sig Start Date End  Date Taking? Authorizing Provider  butalbital-acetaminophen-caffeine (FIORICET, ESGIC) 50-325-40 MG tablet Take 1 tablet by mouth every 6 (six) hours as needed for headache. 01/21/18   Pucilowska, Jolanta B, MD  chlorproMAZINE (THORAZINE) 25 MG tablet Take 1 tablet (25 mg total) by mouth 3 (three) times daily. 01/22/18   Pucilowska, Braulio Conte B, MD  clonazePAM (KLONOPIN) 0.5 MG tablet Take 1 tablet (0.5 mg total) by mouth 3 (three) times daily as needed. 01/21/18   Pucilowska, Jolanta B, MD  fluvoxaMINE (LUVOX) 50 MG tablet Take 3 tablets (150 mg total) by mouth at bedtime. 01/21/18   Pucilowska, Braulio Conte B, MD  gabapentin (NEURONTIN) 300 MG capsule Take 1 capsule (300 mg total) by mouth 3 (three) times daily. 01/21/18 01/21/19  Pucilowska, Ellin Goodie, MD  gemfibrozil (LOPID) 600 MG tablet Take 1 tablet (600 mg total) by mouth 2 (two) times daily before a meal. 01/22/18   Pucilowska, Jolanta B, MD  oxyCODONE-acetaminophen (PERCOCET) 5-325 MG tablet Take 1 tablet by mouth every 4 (four) hours as needed for severe pain. 01/30/18 01/30/19  Enid Derry, PA-C  QUEtiapine (SEROQUEL) 400 MG tablet Take 1 tablet (400 mg total) by mouth at bedtime. 01/21/18   Pucilowska, Braulio Conte B, MD  topiramate (TOPAMAX) 200 MG tablet Take 1 tablet (200 mg total) by mouth at bedtime. 01/22/18   Pucilowska, Ellin Goodie, MD    Allergies Latex; Propoxyphene; Amoxicillin; Darvocet [propoxyphene n-acetaminophen]; Metoclopramide; Sulfa antibiotics; and Sulfacetamide sodium  Family History  Problem  Relation Age of Onset  . Stroke Mother   . Cancer Father     Social History Social History   Tobacco Use  . Smoking status: Current Some Day Smoker    Packs/day: 0.50  . Smokeless tobacco: Never Used  Substance Use Topics  . Alcohol use: No  . Drug use: No    Review of Systems Constitutional: No fever/chills Eyes: No visual changes. ENT: No sore throat. Cardiovascular: Denies chest pain. Respiratory: Denies shortness of  breath. Gastrointestinal: No abdominal pain.  No nausea, no vomiting.  No diarrhea.  No constipation. Genitourinary: Negative for dysuria. Musculoskeletal: Negative for neck pain.  Negative for back pain. Integumentary: Negative for rash. Neurological: Negative for headaches, focal weakness or numbness.  Positive for generalized weakness   ____________________________________________   PHYSICAL EXAM:  VITAL SIGNS: ED Triage Vitals  Enc Vitals Group     BP 02/01/18 1716 107/70     Pulse Rate 02/01/18 1716 (!) 112     Resp 02/01/18 1716 20     Temp 02/01/18 1716 99.1 F (37.3 C)     Temp Source 02/01/18 1716 Oral     SpO2 02/01/18 1716 95 %     Weight 02/01/18 1718 127 kg (280 lb)     Height 02/01/18 1718 1.702 m (5\' 7" )     Head Circumference --      Peak Flow --      Pain Score 02/01/18 1718 6     Pain Loc --      Pain Edu? --      Excl. in GC? --     Constitutional: Alert and oriented. Well appearing and in no acute distress. Eyes: Conjunctivae are normal. PERRL. EOMI. Head: Atraumatic. Mouth/Throat: Mucous membranes are moist. Oropharynx non-erythematous. Neck: No stridor.  Cardiovascular: Normal rate, regular rhythm. Good peripheral circulation. Grossly normal heart sounds. Respiratory: Normal respiratory effort.  No retractions. Lungs CTAB. Gastrointestinal: Soft and nontender. No distention.  Musculoskeletal: No lower extremity tenderness nor edema. No gross deformities of extremities. Neurologic:  Normal speech and language. No gross focal neurologic deficits are appreciated.  Skin:  Skin is warm, dry and intact. No rash noted. Psychiatric: Mood and affect are normal. Speech and behavior are normal.  ____________________________________________   LABS (all labs ordered are listed, but only abnormal results are displayed)  Labs Reviewed  BASIC METABOLIC PANEL - Abnormal; Notable for the following components:      Result Value   CO2 21 (*)    BUN 21 (*)     Creatinine, Ser 1.31 (*)    GFR calc non Af Amer 47 (*)    GFR calc Af Amer 55 (*)    All other components within normal limits  CBC  TROPONIN I   ____________________________________________  EKG  ED ECG REPORT I, Alma N Brinlynn Gorton, the attending physician, personally viewed and interpreted this ECG.   Date: 02/02/2018  EKG Time: 5:30 PM  Rate: 108  Rhythm: Sinus tachycardia  Axis: Normal  Intervals: Normal  ST&T Change: None  ____________________________________________  RADIOLOGY I, [place] Dewayne Shorter, personally viewed and evaluated these images (plain radiographs) as part of my medical decision making, as well as reviewing the written report by the radiologist.  ED MD interpretation:    Official radiology report(s): Dg Chest 2 View  Result Date: 02/01/2018 CLINICAL DATA:  Larey Seat today. Multiple recent falls. EXAM: CHEST - 2 VIEW COMPARISON:  01/30/2018 chest and left ribs radiographs. FINDINGS: Normal sized heart. Interval minimal linear  density at the left lung base. Otherwise, clear lungs with normal vascularity. No fracture or pneumothorax seen. Mild thoracic spine degenerative changes. Small right humeral head bone island. IMPRESSION: Interval minimal linear atelectasis at the left lung base. Otherwise, unremarkable examination. Electronically Signed   By: Beckie Salts M.D.   On: 02/01/2018 18:10    _______________________ Procedures   ____________________________________________   INITIAL IMPRESSION / ASSESSMENT AND PLAN / ED COURSE  As part of my medical decision making, I reviewed the following data within the electronic MEDICAL RECORD NUMBER   47 year old female presented with above-stated history and physical exam secondary to generalized weakness for months with no clear etiology.  Patient has been seen in the emergency department 8 times over the last 6 months with extensive work-up performed for ovarian complaints including 2 head CT scans which were both  unremarkable.  Patient referred to primary care provider for further outpatient evaluation ____________________________________________  FINAL CLINICAL IMPRESSION(S) / ED DIAGNOSES  Final diagnoses:  Contusion of left front wall of thorax, initial encounter  Weakness     MEDICATIONS GIVEN DURING THIS VISIT:  Medications  sodium chloride 0.9 % bolus 1,000 mL (1,000 mLs Intravenous New Bag/Given 02/02/18 0051)  traMADol (ULTRAM) tablet 50 mg (50 mg Oral Given 02/02/18 0104)     ED Discharge Orders    None       Note:  This document was prepared using Dragon voice recognition software and may include unintentional dictation errors.    Darci Current, MD 02/02/18 [phone]

## 2018-02-11 NOTE — Progress Notes (Deleted)
Cardiology Office Note  Date:  02/11/2018   ID:  Tammy Robles, DOB July 20, 1970, MRN [id number]  PCP:  Patient, No Pcp Per   No chief complaint on file.   HPI:   smoker Bipolar Smoker Borderline personality d/o Anxiety/depression PTSD Generalized weakness, Frequent trips to the ER dating back years, falls, suicidal ideation, chest pain (9 ER visits), sprains, concussions Idiopathic intracranial hypertension  ED fu seen on 01/12/18 seen for CP chest pain is in the middle the left side of the chest and is sharp and a 7 out of 10.   Had UTI  In the ER 01/27/2017 for chest pain Felt to be anxiety/panic ER 07/2014 for chest pain     PMH:   has a past medical history of Bladder prolapse, female, acquired, Endometriosis (1996), Endometritis (1990), Idiopathic intracranial hypertension, and Polycystic ovarian disease (1993).  PSH:    Past Surgical History:  Procedure Laterality Date  . ABDOMINAL HYSTERECTOMY    . 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 HIP ARTHROPLASTY    . hysterrectomy  1996  . LAPAROTOMY     multiple for cysts  . LEFT OOPHORECTOMY  2007  . mass off appendix    . RIGHT OOPHORECTOMY    . TONSILLECTOMY    . WRIST SURGERY  2007 and 2003   both tendon release    Current Outpatient Medications  Medication Sig Dispense Refill  . butalbital-acetaminophen-caffeine (FIORICET, ESGIC) 50-325-40 MG tablet Take 1 tablet by mouth every 6 (six) hours as needed for headache. 14 tablet 0  . chlorproMAZINE (THORAZINE) 25 MG tablet Take 1 tablet (25 mg total) by mouth 3 (three) times daily. 90 tablet 1  . clonazePAM (KLONOPIN) 0.5 MG tablet Take 1 tablet (0.5 mg total) by mouth 3 (three) times daily as needed. 90 tablet 1  . fluvoxaMINE (LUVOX) 50 MG tablet Take 3 tablets (150 mg total) by mouth at bedtime. 90 tablet 1  . gabapentin (NEURONTIN) 300 MG capsule Take 1 capsule (300 mg total) by mouth 3 (three) times daily. 90 capsule  0  . gemfibrozil (LOPID) 600 MG tablet Take 1 tablet (600 mg total) by mouth 2 (two) times daily before a meal. 60 tablet 1  . oxyCODONE-acetaminophen (PERCOCET) 5-325 MG tablet Take 1 tablet by mouth every 4 (four) hours as needed for severe pain. 6 tablet 0  . QUEtiapine (SEROQUEL) 400 MG tablet Take 1 tablet (400 mg total) by mouth at bedtime. 30 tablet 1  . topiramate (TOPAMAX) 200 MG tablet Take 1 tablet (200 mg total) by mouth at bedtime. 30 tablet 1   No current facility-administered medications for this visit.      Allergies:   Latex; Propoxyphene; Amoxicillin; Darvocet [propoxyphene n-acetaminophen]; Metoclopramide; Sulfa antibiotics; and Sulfacetamide sodium   Social History:  The patient  reports that she has been smoking. She has been smoking about 0.50 packs per day. She has never used smokeless tobacco. She reports that she does not drink alcohol or use drugs.   Family History:   family history includes Cancer in her father; Stroke in her mother.    Review of Systems: ROS   PHYSICAL EXAM: VS:  There were no vitals taken for this visit. , BMI There is no height or weight on file to calculate BMI. GEN: Well nourished, well developed, in no acute distress HEENT: normal Neck: no JVD, carotid bruits, or masses Cardiac: RRR; no murmurs, rubs, or gallops,no edema  Respiratory:  clear to auscultation bilaterally, normal work of breathing GI: soft, nontender, nondistended, + BS MS: no deformity or atrophy Skin: warm and dry, no rash Neuro:  Strength and sensation are intact Psych: euthymic mood, full affect    Recent Labs: 01/17/2018: ALT 26; TSH 7.659 02/01/2018: BUN 21; Creatinine, Ser 1.31; Hemoglobin 13.0; Platelets 314; Potassium 4.2; Sodium 136    Lipid Panel Lab Results  Component Value Date   CHOL 214 (H) 01/17/2018   HDL 54 01/17/2018   LDLCALC UNABLE TO CALCULATE IF TRIGLYCERIDE OVER 400 mg/dL 19/14/7829   TRIG 562 (H) 01/17/2018      Wt Readings from  Last 3 Encounters:  02/01/18 280 lb (127 kg)  01/30/18 280 lb (127 kg)  01/17/18 274 lb 0.5 oz (124.3 kg)       ASSESSMENT AND PLAN:  No diagnosis found.   Disposition:   F/U  6 months  No orders of the defined types were placed in this encounter.    Signed, Dossie Arbour, M.D., Ph.D. 02/11/2018  [facility] [place], [place] [phone]

## 2018-02-12 ENCOUNTER — Ambulatory Visit: Payer: Self-pay | Admitting: Cardiovascular Disease

## 2018-02-13 ENCOUNTER — Encounter: Payer: Self-pay | Admitting: Cardiovascular Disease

## 2018-03-26 NOTE — Progress Notes (Unsigned)
NO SHOW

## 2018-06-29 ENCOUNTER — Emergency Department: Payer: Medicaid Other

## 2018-06-29 ENCOUNTER — Emergency Department
Admission: EM | Admit: 2018-06-29 | Discharge: 2018-06-29 | Disposition: A | Discharge status: Home / Self Care | Payer: Medicaid Other | Attending: Emergency Medicine | Admitting: Emergency Medicine

## 2018-06-29 ENCOUNTER — Other Ambulatory Visit: Payer: Self-pay

## 2018-06-29 ENCOUNTER — Encounter: Payer: Self-pay | Admitting: Emergency Medicine

## 2018-06-29 DIAGNOSIS — Z96649 Presence of unspecified artificial hip joint: Secondary | ICD-10-CM | POA: Insufficient documentation

## 2018-06-29 DIAGNOSIS — F17200 Nicotine dependence, unspecified, uncomplicated: Secondary | ICD-10-CM | POA: Insufficient documentation

## 2018-06-29 DIAGNOSIS — R0789 Other chest pain: Secondary | ICD-10-CM | POA: Diagnosis not present

## 2018-06-29 DIAGNOSIS — Z79899 Other long term (current) drug therapy: Secondary | ICD-10-CM | POA: Insufficient documentation

## 2018-06-29 LAB — BASIC METABOLIC PANEL
Anion gap: 7 (ref 5–15)
BUN: 13 mg/dL (ref 6–20)
CO2: 24 mmol/L (ref 22–32)
CREATININE: 1.2 mg/dL — AB (ref 0.44–1.00)
Calcium: 9.1 mg/dL (ref 8.9–10.3)
Chloride: 110 mmol/L (ref 98–111)
GFR calc Af Amer: >60 mL/min (ref >60)
GFR calc non Af Amer: 53 mL/min — ABNORMAL LOW (ref >60)
Glucose, Bld: 79 mg/dL (ref 70–99)
Potassium: 3.8 mmol/L (ref 3.5–5.1)
Sodium: 141 mmol/L (ref 135–145)

## 2018-06-29 LAB — CBC
HCT: 39.8 % (ref 36.0–46.0)
Hemoglobin: 12.4 g/dL (ref 12.0–15.0)
MCH: 29.2 pg (ref 26.0–34.0)
MCHC: 31.2 g/dL (ref 30.0–36.0)
MCV: 93.6 fL (ref 80.0–100.0)
Platelets: 302 10*3/uL (ref 150–400)
RBC: 4.25 MIL/uL (ref 3.87–5.11)
RDW: 13.5 % (ref 11.5–15.5)
WBC: 6.6 10*3/uL (ref 4.0–10.5)
nRBC: 0 % (ref 0.0–0.2)

## 2018-06-29 LAB — TROPONIN I: Troponin I: <0.03 ng/mL (ref <0.03)

## 2018-06-29 MED ORDER — NAPROXEN 500 MG PO TABS: 500.0000 mg | ORAL_TABLET | Freq: Two times a day (BID) | ORAL | 2 refills | Status: AC

## 2018-06-29 MED ORDER — KETOROLAC TROMETHAMINE 60 MG/2ML IM SOLN
30.0000 mg | Freq: Once | INTRAMUSCULAR | Status: AC
  Administered 2018-06-29: 30 mg via INTRAMUSCULAR

## 2018-06-29 MED ORDER — KETOROLAC TROMETHAMINE 60 MG/2ML IM SOLN: 30.0000 mg | Freq: Once | INTRAMUSCULAR | Status: DC

## 2018-06-29 MED ORDER — METHOCARBAMOL 500 MG PO TABS: 500.0000 mg | ORAL_TABLET | Freq: Three times a day (TID) | ORAL | 0 refills | Status: AC | PRN

## 2018-06-29 MED ORDER — KETOROLAC TROMETHAMINE 30 MG/ML IJ SOLN: 30.0000 mg | Freq: Once | INTRAMUSCULAR | Status: DC

## 2018-06-29 MED ORDER — KETOROLAC TROMETHAMINE 30 MG/ML IJ SOLN
INTRAMUSCULAR | Status: AC
  Filled 2018-06-29: qty 1

## 2018-06-29 NOTE — ED Triage Notes (Signed)
Patient reports pain in left chest radiating down left arm x3 days. Patient also states she feels "run down" and has had some nausea. Patient denies recent illness.

## 2018-06-29 NOTE — ED Triage Notes (Signed)
First Nurse Note:  C/O left arm numbness, chills, feeling ill x 1 day.  AAOx3.  Skin warm and dry. NAD

## 2018-06-29 NOTE — ED Notes (Signed)
Pt not on cardiac monitor at this time per Dr. Cyril Loosen

## 2018-06-29 NOTE — ED Provider Notes (Signed)
[facility] Emergency Department Provider Note   ____________________________________________    I have reviewed the triage vital signs and the nursing notes.   HISTORY  Chief Complaint Chest Pain     HPI Tammy Robles is a 48 y.o. female who presents with complaints of chest pain.  Patient describes l left-sided aching pain as well as some neck discomfort with an occasional tingling sensation in her left arm.  This is been ongoing for 3 days, seemed to get worse yesterday.  No shortness of breath.  No fevers or chills.  Does report she has been using her cane with her left arm recently as opposed to her right arm.  No history of heart disease.  She does smoke cigarettes  Past Medical History:  Diagnosis Date  . Bladder prolapse, female, acquired   . Endometriosis 1996  . Endometritis 1990   fever 105 post delivery  . Idiopathic intracranial hypertension   . Polycystic ovarian disease 1993    Patient Active Problem List   Diagnosis Date Noted  . Suicidal ideation 01/18/2018  . Overweight 01/02/2017  . Tobacco use disorder 12/23/2016  . Bipolar 2 disorder (HCC) 12/20/2016  . Cannabis use disorder, moderate, dependence (HCC) 12/20/2016  . Migraine headache 12/20/2016  . Borderline personality disorder (HCC) 11/04/2014  . Anxiety and depression 03/23/2012  . PTSD (post-traumatic stress disorder) 03/29/2004    Past Surgical History:  Procedure Laterality Date  . ABDOMINAL HYSTERECTOMY    . 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 HIP ARTHROPLASTY    . hysterrectomy  1996  . LAPAROTOMY     multiple for cysts  . LEFT OOPHORECTOMY  2007  . mass off appendix    . RIGHT OOPHORECTOMY    . TONSILLECTOMY    . WRIST SURGERY  2007 and 2003   both tendon release    Prior to Admission medications   Medication Sig Start Date End Date Taking? Authorizing Provider  butalbital-acetaminophen-caffeine  (FIORICET, ESGIC) 50-325-40 MG tablet Take 1 tablet by mouth every 6 (six) hours as needed for headache. 01/21/18   Pucilowska, Jolanta B, MD  chlorproMAZINE (THORAZINE) 25 MG tablet Take 1 tablet (25 mg total) by mouth 3 (three) times daily. 01/22/18   Pucilowska, Braulio Conte B, MD  clonazePAM (KLONOPIN) 0.5 MG tablet Take 1 tablet (0.5 mg total) by mouth 3 (three) times daily as needed. 01/21/18   Pucilowska, Jolanta B, MD  fluvoxaMINE (LUVOX) 50 MG tablet Take 3 tablets (150 mg total) by mouth at bedtime. 01/21/18   Pucilowska, Braulio Conte B, MD  gabapentin (NEURONTIN) 300 MG capsule Take 1 capsule (300 mg total) by mouth 3 (three) times daily. 01/21/18 01/21/19  Pucilowska, Ellin Goodie, MD  gemfibrozil (LOPID) 600 MG tablet Take 1 tablet (600 mg total) by mouth 2 (two) times daily before a meal. 01/22/18   Pucilowska, Jolanta B, MD  methocarbamol (ROBAXIN) 500 MG tablet Take 1 tablet (500 mg total) by mouth every 8 (eight) hours as needed for muscle spasms. 06/29/18   Jene Every, MD  naproxen (NAPROSYN) 500 MG tablet Take 1 tablet (500 mg total) by mouth 2 (two) times daily with a meal. 06/29/18   Jene Every, MD  oxyCODONE-acetaminophen (PERCOCET) 5-325 MG tablet Take 1 tablet by mouth every 4 (four) hours as needed for severe pain. 01/30/18 01/30/19  Enid Derry, PA-C  QUEtiapine (SEROQUEL) 400 MG tablet Take 1 tablet (400 mg total) by mouth  at bedtime. 01/21/18   Pucilowska, Braulio Conte B, MD  topiramate (TOPAMAX) 200 MG tablet Take 1 tablet (200 mg total) by mouth at bedtime. 01/22/18   Pucilowska, Ellin Goodie, MD     Allergies Latex; Propoxyphene; Amoxicillin; Darvocet [propoxyphene n-acetaminophen]; Metoclopramide; Sulfa antibiotics; and Sulfacetamide sodium  Family History  Problem Relation Age of Onset  . Stroke Mother   . Cancer Father     Social History Social History   Tobacco Use  . Smoking status: Current Some Day Smoker    Packs/day: 0.50  . Smokeless tobacco: Never Used  Substance Use  Topics  . Alcohol use: No  . Drug use: No    Review of Systems  Constitutional: No fever/chills Eyes: No visual changes.  ENT: No sore throat. Cardiovascular: As above Respiratory: Denies shortness of breath. Gastrointestinal: No abdominal pain.  No nausea, no vomiting.   Genitourinary: Negative for dysuria. Musculoskeletal: As above Skin: No rash Neurological: Negative for headaches   ____________________________________________   PHYSICAL EXAM:  VITAL SIGNS: ED Triage Vitals [06/29/18 1757]  Enc Vitals Group     BP 136/73     Pulse Rate 89     Resp 16     Temp 97.8 F (36.6 C)     Temp Source Oral     SpO2 97 %     Weight 122.5 kg (270 lb)     Height 1.702 m (5\' 7" )     Head Circumference      Peak Flow      Pain Score 8     Pain Loc      Pain Edu?      Excl. in GC?     Constitutional: Alert and oriented. No acute distress. Pleasant and interactive  Mouth/Throat: Mucous membranes are moist.    Cardiovascular: Normal rate, regular rhythm. Grossly normal heart sounds.  Good peripheral circulation.  Tenderness palpation along the left dorsalis as it runs towards the left arm Respiratory: Normal respiratory effort.  No retractions. Lungs CTAB. Gastrointestinal: Soft and nontender. No distention.    Musculoskeletal: No lower extremity tenderness nor edema.  Warm and well perfused Neurologic:  Normal speech and language. No gross focal neurologic deficits are appreciated.  Skin:  Skin is warm, dry and intact. No rash noted. Psychiatric: Mood and affect are normal. Speech and behavior are normal.  ____________________________________________   LABS (all labs ordered are listed, but only abnormal results are displayed)  Labs Reviewed  BASIC METABOLIC PANEL - Abnormal; Notable for the following components:      Result Value   Creatinine, Ser 1.20 (*)    GFR calc non Af Amer 53 (*)    All other components within normal limits  CBC  TROPONIN I    ____________________________________________  EKG  ED ECG REPORT I, Jene Every, the attending physician, personally viewed and interpreted this ECG.  Date: 06/29/2018  Rhythm: normal sinus rhythm QRS Axis: normal Intervals: normal ST/T Wave abnormalities: normal Narrative Interpretation: no evidence of acute ischemia  ____________________________________________  RADIOLOGY  X-ray unremarkable ____________________________________________   PROCEDURES  Procedure(s) performed: No  Procedures   Critical Care performed: No ____________________________________________   INITIAL IMPRESSION / ASSESSMENT AND PLAN / ED COURSE  Pertinent labs & imaging results that were available during my care of the patient were reviewed by me and considered in my medical decision making (see chart for details).  Patient well-appearing in no acute distress, EKG is reassuring, lab work is benign, chest x-ray is normal.  Symptoms  do not appear consistent with ACS or PE or myocarditis or dissection rather I suspect chest wall pain, likely muscular as the cause.  Treated with IM Toradol with significant improvement, will discharge on anti-inflammatories, outpatient follow-up, strict return precautions discussed    ____________________________________________   FINAL CLINICAL IMPRESSION(S) / ED DIAGNOSES  Final diagnoses:  Chest wall pain        Note:  This document was prepared using Dragon voice recognition software and may include unintentional dictation errors.   Jene EveryKinner, Kesley Mullens, MD 06/29/18 2214

## 2018-12-19 ENCOUNTER — Telehealth: Payer: Self-pay | Admitting: Pharmacy Technician

## 2018-12-19 NOTE — Telephone Encounter (Signed)
Patient failed to provide2020 financial documentation. No additional medication assistance will be provided by [facility] without the required proof of income documentation. Patient notified by letter.  Kathrynne Kulinski, CPhT [facility] 

## 2019-09-08 IMAGING — CR DG CHEST 2V
2 series · 2 of 2 positions shown · non-contrast
Comparison: Chest x-ray dated 12/29/2016.

CLINICAL DATA: Chest pain

EXAM:
CHEST  2 VIEW

[chest pa]
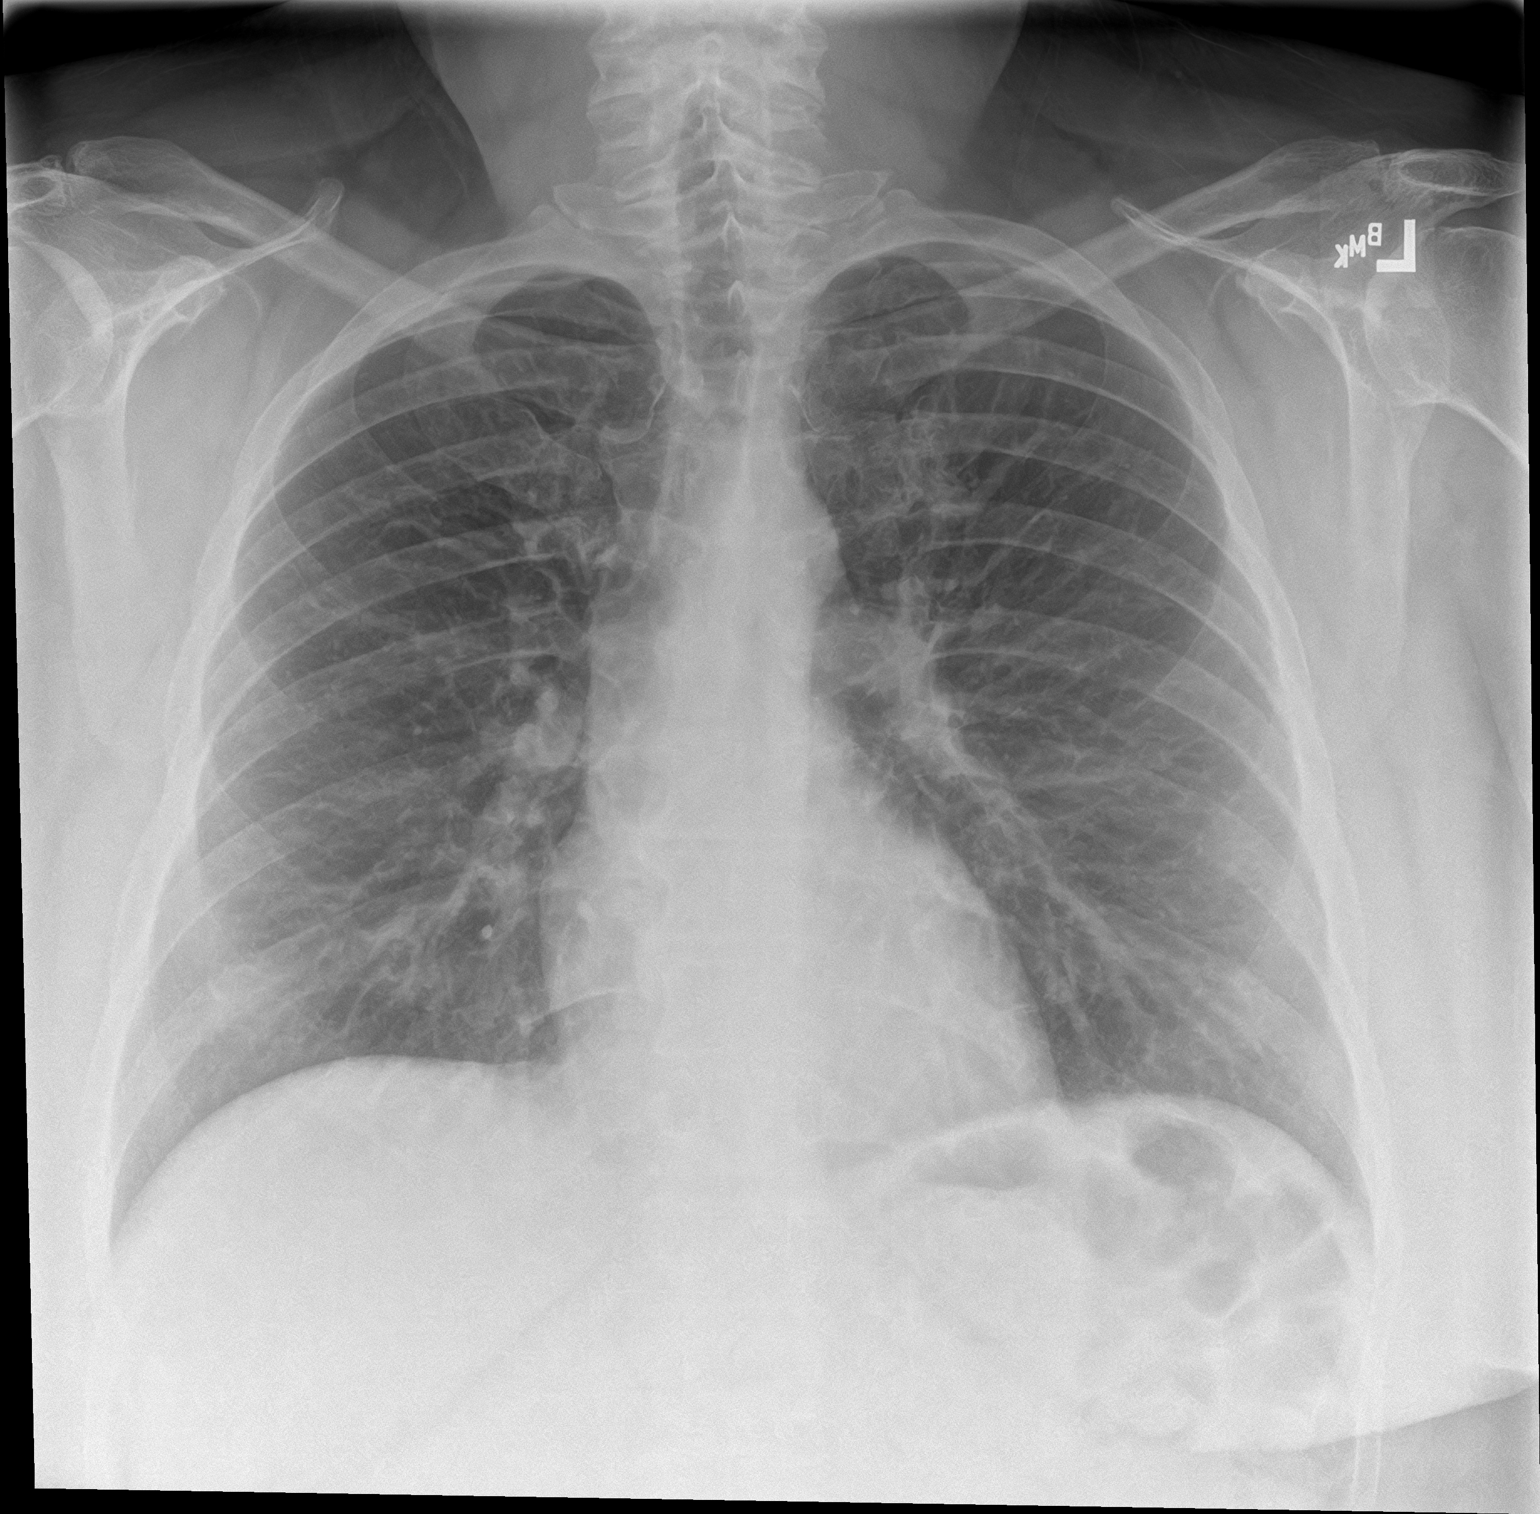

[chest lat]
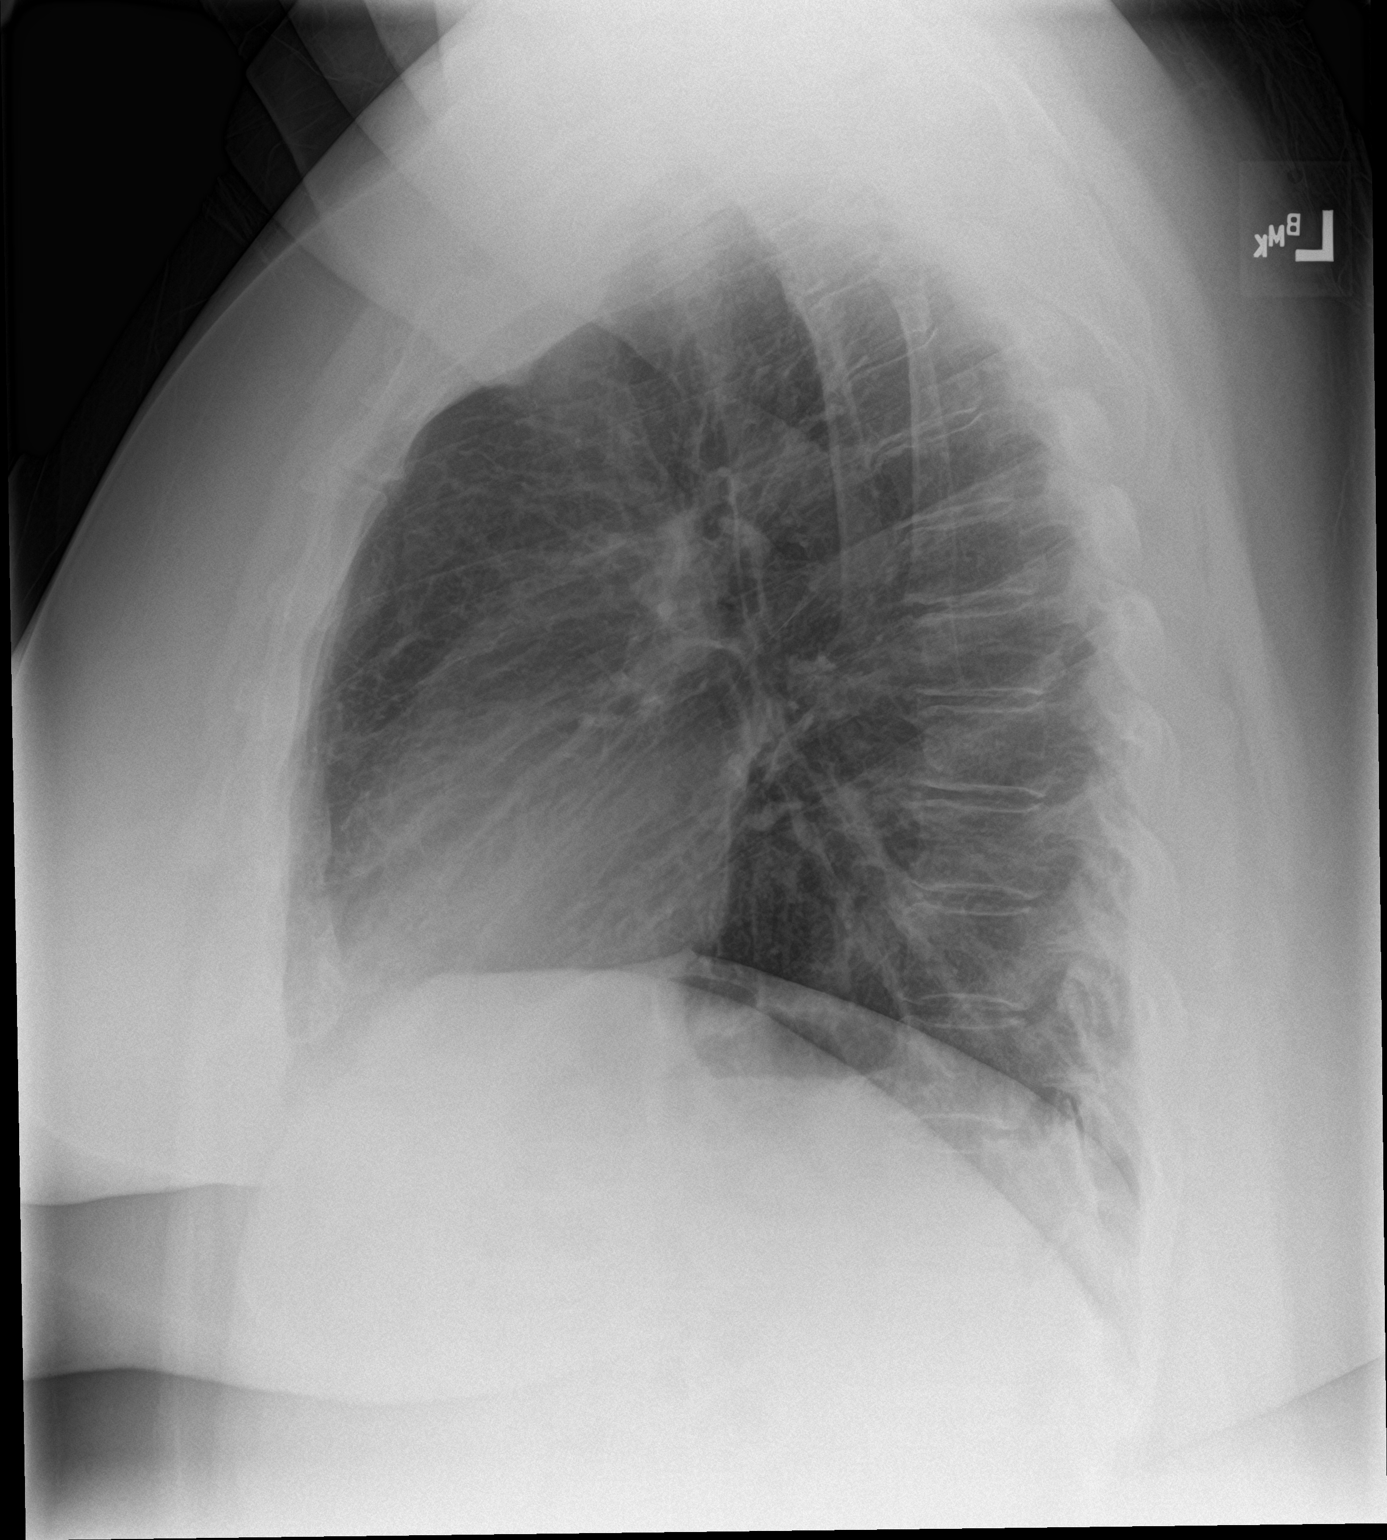

[2 of 2 positions shown; findings below may reference images not displayed]

FINDINGS: Heart size and mediastinal contours are normal. Lungs are clear. No
pleural effusion or pneumothorax seen. No acute or suspicious
osseous finding.
IMPRESSION: No active cardiopulmonary disease. No evidence of pneumonia or
pulmonary edema

## 2020-07-19 IMAGING — DX DG HIP (WITH OR WITHOUT PELVIS) 2-3V*R*
3 series · 3 of 3 positions shown · non-contrast
Comparison: 08/25/2014

CLINICAL DATA: Fall with severe right hip pain

EXAM:
DG HIP (WITH OR WITHOUT PELVIS) 2-3V RIGHT

[t pelvis ap]
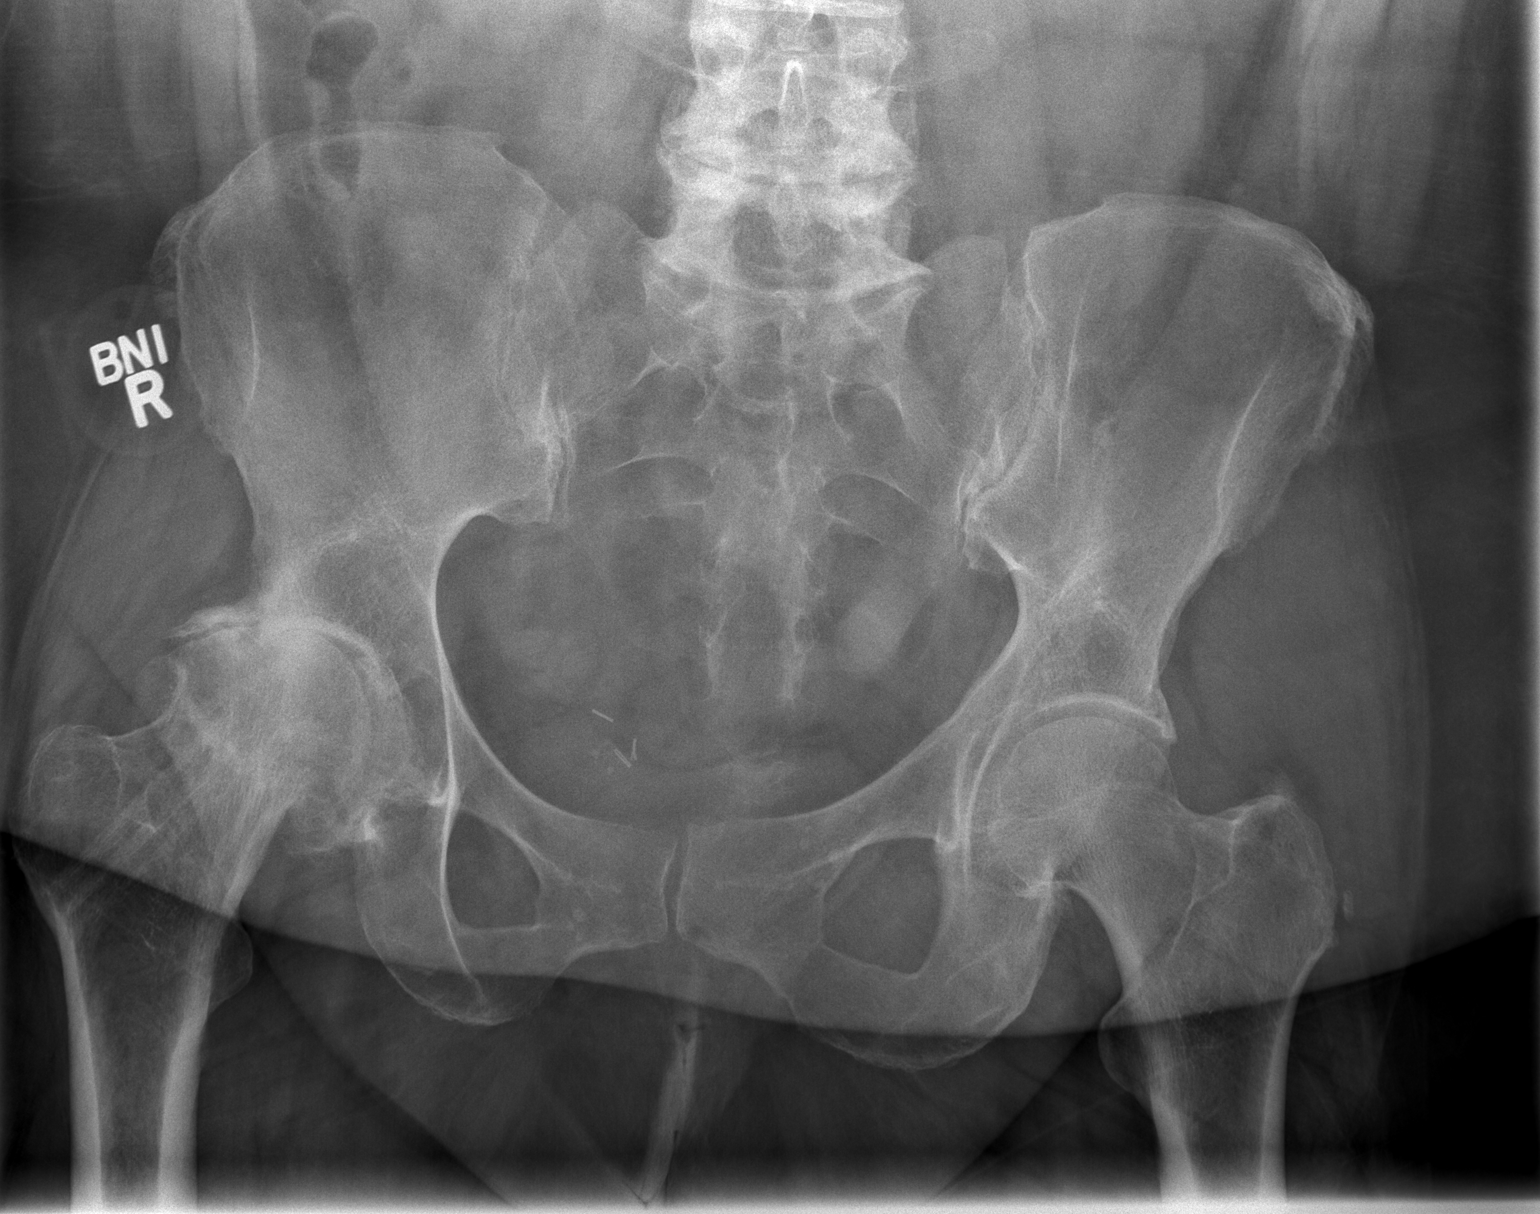

[t hip ap right]
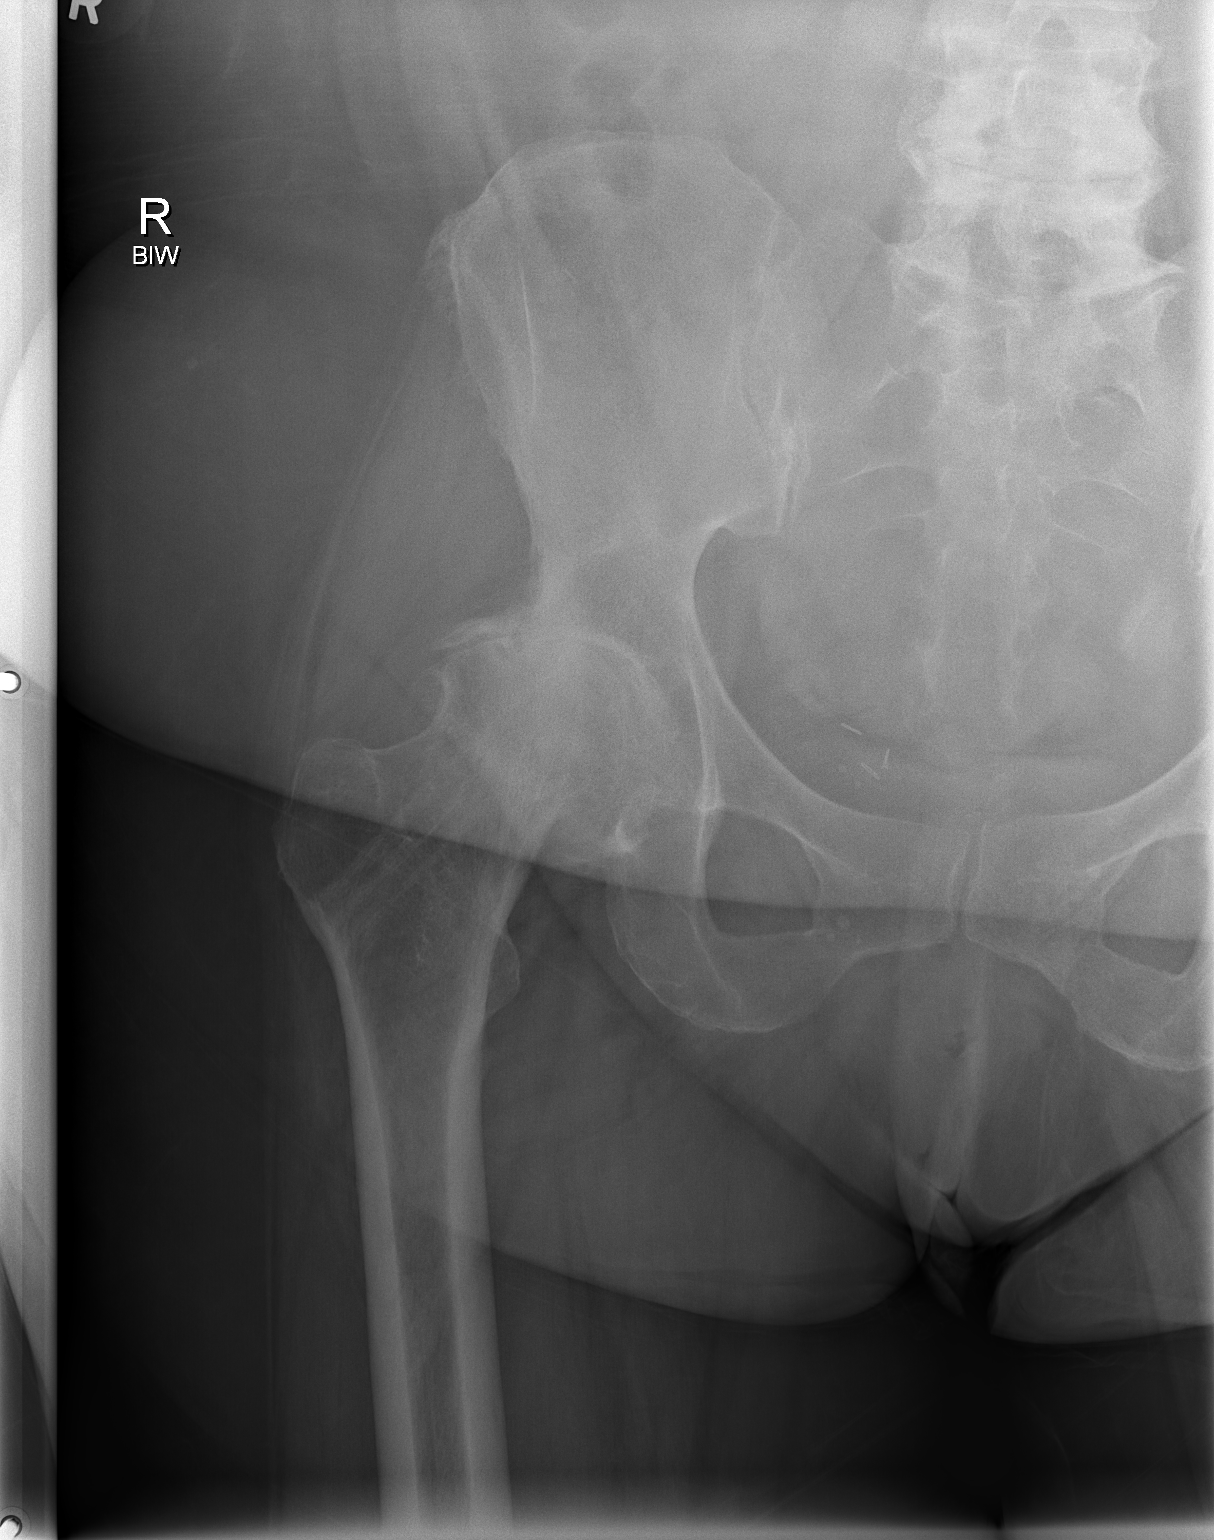

[t hip frog leg right]
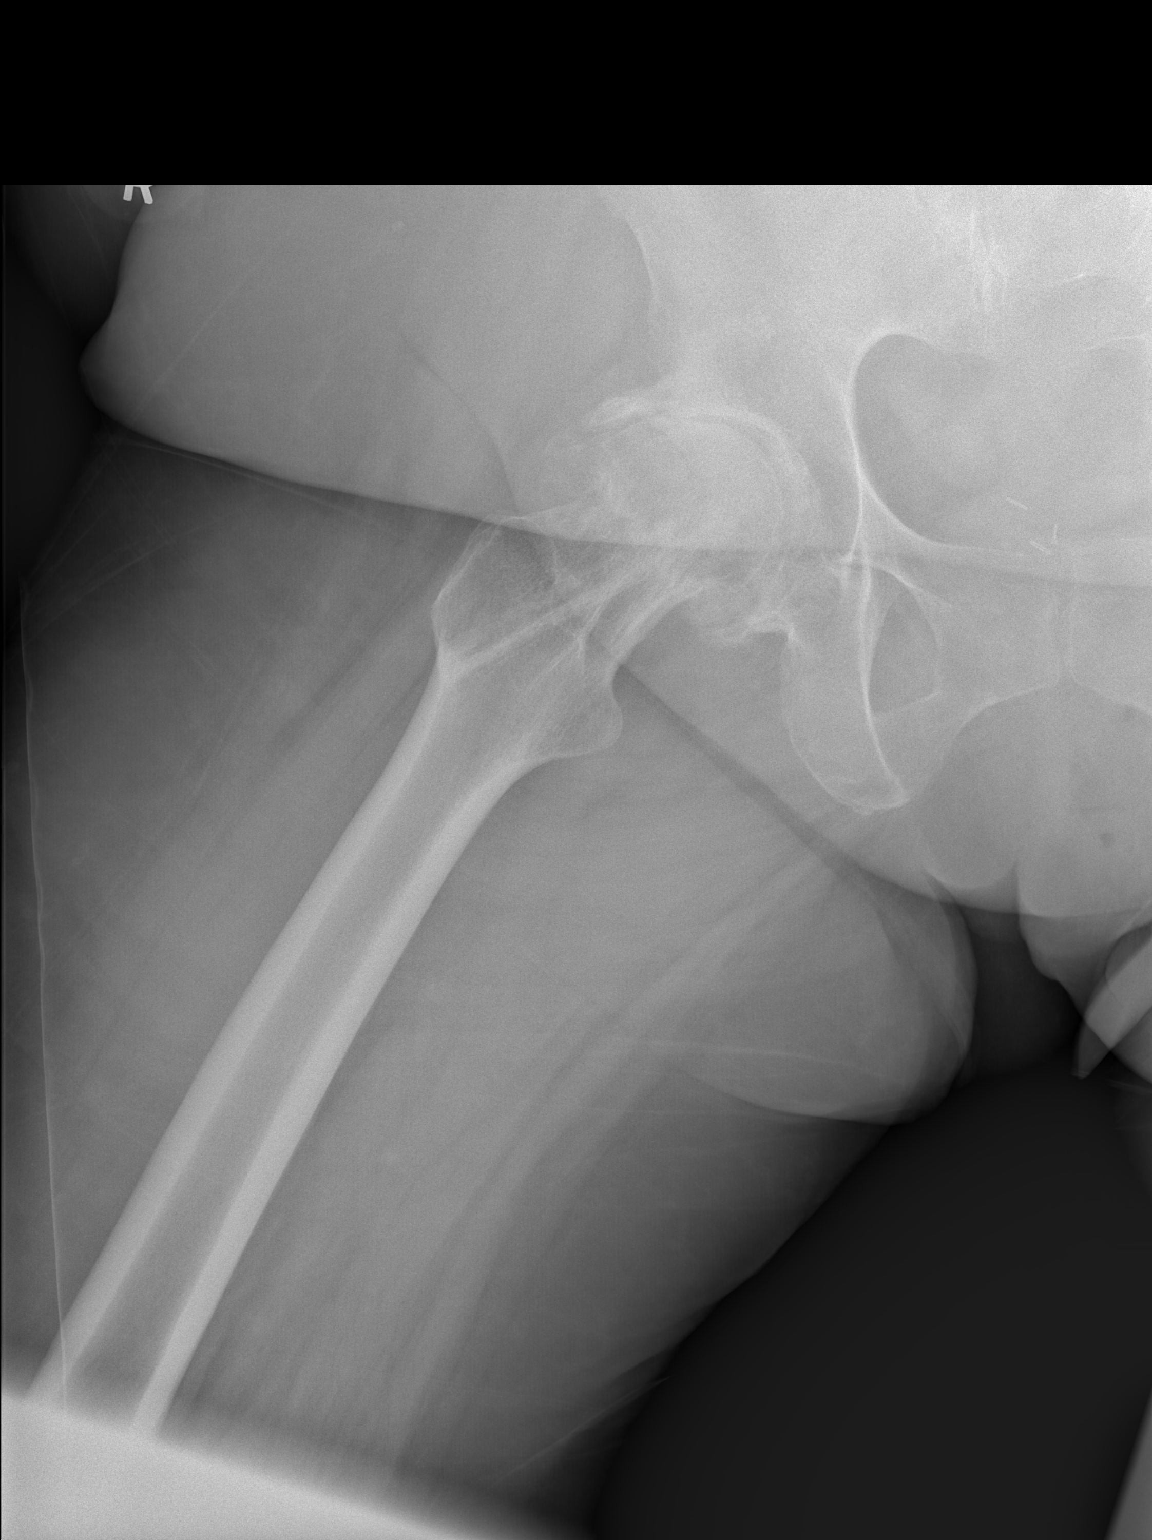

[3 of 3 positions shown; findings below may reference images not displayed]

FINDINGS: Severe and progressive right hip osteoarthritis with superolateral
joint narrowing and bulky spurring. Subchondral sclerosis. Changes
of remote core decompression or pinning at the right femoral neck.

The lower lumbar disc spaces are narrowed with endplate spurring.

No evidence of fracture or erosion.
IMPRESSION: Severe and progressive right hip osteoarthritis.
